# Patient Record
Sex: Female | Born: 1940 | Race: White | Hispanic: No | Marital: Married | Smoking: Never smoker
Health system: Southern US, Community
[De-identification: ages and names within clinical notes are randomized; demographics above are authoritative.]

## PROBLEM LIST (undated history)

## (undated) DIAGNOSIS — I10 Essential (primary) hypertension: Secondary | ICD-10-CM

## (undated) DIAGNOSIS — I471 Supraventricular tachycardia, unspecified: Secondary | ICD-10-CM

## (undated) DIAGNOSIS — I639 Cerebral infarction, unspecified: Secondary | ICD-10-CM

## (undated) DIAGNOSIS — M069 Rheumatoid arthritis, unspecified: Secondary | ICD-10-CM

## (undated) DIAGNOSIS — E079 Disorder of thyroid, unspecified: Secondary | ICD-10-CM

## (undated) DIAGNOSIS — K219 Gastro-esophageal reflux disease without esophagitis: Secondary | ICD-10-CM

## (undated) DIAGNOSIS — G2581 Restless legs syndrome: Secondary | ICD-10-CM

---

## 2021-07-02 ENCOUNTER — Emergency Department (HOSPITAL_BASED_OUTPATIENT_CLINIC_OR_DEPARTMENT_OTHER)
Admission: EM | Admit: 2021-07-02 | Discharge: 2021-07-02 | Disposition: A | Discharge status: Home / Self Care | Payer: Medicare Other | Attending: Emergency Medicine | Admitting: Emergency Medicine

## 2021-07-02 ENCOUNTER — Other Ambulatory Visit: Payer: Self-pay

## 2021-07-02 ENCOUNTER — Emergency Department (HOSPITAL_BASED_OUTPATIENT_CLINIC_OR_DEPARTMENT_OTHER): Payer: Medicare Other

## 2021-07-02 ENCOUNTER — Encounter (HOSPITAL_BASED_OUTPATIENT_CLINIC_OR_DEPARTMENT_OTHER): Payer: Self-pay | Admitting: Emergency Medicine

## 2021-07-02 ENCOUNTER — Other Ambulatory Visit (HOSPITAL_BASED_OUTPATIENT_CLINIC_OR_DEPARTMENT_OTHER): Payer: Self-pay

## 2021-07-02 DIAGNOSIS — I10 Essential (primary) hypertension: Secondary | ICD-10-CM | POA: Insufficient documentation

## 2021-07-02 DIAGNOSIS — M25531 Pain in right wrist: Secondary | ICD-10-CM | POA: Insufficient documentation

## 2021-07-02 DIAGNOSIS — W01198A Fall on same level from slipping, tripping and stumbling with subsequent striking against other object, initial encounter: Secondary | ICD-10-CM | POA: Diagnosis not present

## 2021-07-02 DIAGNOSIS — S59911A Unspecified injury of right forearm, initial encounter: Secondary | ICD-10-CM | POA: Diagnosis present

## 2021-07-02 DIAGNOSIS — S60211A Contusion of right wrist, initial encounter: Secondary | ICD-10-CM | POA: Insufficient documentation

## 2021-07-02 DIAGNOSIS — S52501A Unspecified fracture of the lower end of right radius, initial encounter for closed fracture: Secondary | ICD-10-CM | POA: Diagnosis not present

## 2021-07-02 DIAGNOSIS — S52614A Nondisplaced fracture of right ulna styloid process, initial encounter for closed fracture: Secondary | ICD-10-CM | POA: Diagnosis not present

## 2021-07-02 HISTORY — DX: Disorder of thyroid, unspecified: E07.9

## 2021-07-02 HISTORY — DX: Cerebral infarction, unspecified: I63.9

## 2021-07-02 HISTORY — DX: Essential (primary) hypertension: I10

## 2021-07-02 HISTORY — DX: Rheumatoid arthritis, unspecified: M06.9

## 2021-07-02 MED ORDER — OXYCODONE-ACETAMINOPHEN 5-325 MG PO TABS
0.5000 | ORAL_TABLET | Freq: Four times a day (QID) | ORAL | 0 refills | Status: DC | PRN
Start: 2021-07-02 — End: 2022-09-19
  Filled 2021-07-02: qty 5, 2d supply, fill #0

## 2021-07-02 MED ORDER — SENNOSIDES-DOCUSATE SODIUM 8.6-50 MG PO TABS
1.0000 | ORAL_TABLET | Freq: Every evening | ORAL | 0 refills | Status: DC | PRN
  Filled 2021-07-02: qty 100, 100d supply, fill #0

## 2021-07-02 NOTE — ED Notes (Signed)
XR at bedside

## 2021-07-02 NOTE — ED Provider Notes (Signed)
? ?Emergency Department Provider Note ? ? ?I have reviewed the triage vital signs and the nursing notes. ? ? ?HISTORY ? ?Chief Complaint ?Fall and Arm Pain ? ? ?HPI ?Sara Snow is a 81 y.o. female with PMH reviewed below presents to the emergency department for evaluation after mechanical fall at home.  Patient states she was backing up when she tripped on a bedspread on the floor and fell backwards.  She suspects she may have put her right arm out to catch herself but ultimately hit the ground.  She did hit the back of her head but states it was very minor.  No loss of consciousness.  No headache, confusion, vomiting.  She got pain with some swelling and bruising to the right wrist.  Pain worse with moving fingers.  No pain into the elbow or shoulder.  No pain in the hips, knees, ankles.  Patient has been ambulatory without difficulty since the fall. ? ?Past Medical History:  ?Diagnosis Date  ? Hypertension   ? RA (rheumatoid arthritis) (HCC)   ? Stroke [facility])   ? Thyroid disease   ? ? ?Review of Systems ? ?Constitutional: No fever/chills ?Cardiovascular: Denies chest pain. ?Respiratory: Denies shortness of breath. ?Gastrointestinal: No abdominal pain.  ?Musculoskeletal: Positive right wrist pain.  ?Skin: Negative for rash. ?Neurological: Negative for headaches, focal weakness or numbness. ? ? ?____________________________________________ ? ? ?PHYSICAL EXAM: ? ?VITAL SIGNS: ?ED Triage Vitals  ?Enc Vitals Group  ?   BP 07/02/21 0856 (!) 204/76  ?   Pulse Rate 07/02/21 0856 (!) 58  ?   Resp 07/02/21 0856 20  ?   Temp 07/02/21 0856 98 ?F (36.7 ?C)  ?   Temp Source 07/02/21 0856 Oral  ?   SpO2 07/02/21 0856 100 %  ?   Weight 07/02/21 0857 122 lb (55.3 kg)  ?   Height 07/02/21 0857 5\' 3"  (1.6 m)  ? ?Constitutional: Alert and oriented. Well appearing and in no acute distress. ?Eyes: Conjunctivae are normal. ?Head: Atraumatic. ?Nose: No congestion/rhinnorhea. ?Mouth/Throat: Mucous membranes are moist.  ?Neck: No  stridor.  No cervical spine tenderness to palpation. ?Cardiovascular: Good peripheral circulation.  ?Respiratory: Normal respiratory effort.   ?Gastrointestinal: No distention.  ?Musculoskeletal: No lower extremity tenderness nor edema. Normal ROM of the bilateral hips. Bruising and swelling to the distal right forearm/wrist w/o laceration. No elbow or shoulder tenderness.  ?Neurologic:  Normal speech and language. No gross focal neurologic deficits are appreciated.  ?Skin:  Skin is warm, dry and intact. Bruising as above.  ? ?____________________________________________ ? ?RADIOLOGY ? ?DG Wrist Complete Right ? ?Result Date: 07/02/2021 ?CLINICAL DATA:  81 year old female status post fall on outstretched hand. EXAM: RIGHT WRIST - COMPLETE 3+ VIEW COMPARISON:  None. FINDINGS: Acute, impacted fracture of the distal radius with mild apex palmar angulation. No evidence of intra-articular involvement. Associated minimally displaced acute avulsion fracture of the ulnar styloid. Carpal alignment is maintained. Soft tissue prominence about the wrist. IMPRESSION: 1. Acute, impacted and mildly angulated distal radius fracture. 2. Acute, mildly displaced avulsion fracture of the ulnar styloid. Electronically Signed   By: 77 M.D.   On: 07/02/2021 09:19   ? ?____________________________________________ ? ? ?PROCEDURES ? ?Procedure(s) performed:  ? ?Procedures ? ?None ?____________________________________________ ? ? ?INITIAL IMPRESSION / ASSESSMENT AND PLAN / ED COURSE ? ?Pertinent labs & imaging results that were available during my care of the patient were reviewed by me and considered in my medical decision making (see chart for details). ?  ?  This patient is Presenting for Evaluation of fall, which does require a range of treatment options, and is a complaint that involves a high risk of morbidity and mortality. ? ?The Differential Diagnoses include wrist fracture, dislocation, head injury, concussion, ICH ? ? ?I  did obtain Additional Historical Information from husband at bedside. ? ?I decided to review pertinent External Data, and in summary patient on ASA but no anticoagulation. ? ?Radiologic Tests Ordered, included x-ray of right wrist. I independently interpreted the images and agree with radiology interpretation.  ? ? ?Social Determinants of Health Risk patient is a non-smoker. ? ? ?Medical Decision Making: Summary:  ?Patient presents to the emergency department for evaluation of right wrist/forearm pain after mechanical fall.  Suspect distal radius fracture clinically.  No elbow or shoulder tenderness.  No outward sign of head trauma, cervical spine tenderness, other findings to prompt additional imaging.  Patient is not anticoagulated. ? ?Reevaluation with update and discussion with patient. Splint applied and sling provided for comfort. Discussed splint care, elevation of the extremity, and cool compress. Discussed calling the ortho office for follow up next week. Patient's pain is well controlled. Will use Tylenol at home but provided a small number of percocet with plan to take 1/2 tab PRN severe pain. Discussed precautions with this medication.  ? ?Disposition: Discharge ? ?____________________________________________ ? ?FINAL CLINICAL IMPRESSION(S) / ED DIAGNOSES ? ?Final diagnoses:  ?Closed fracture of distal end of right radius, unspecified fracture morphology, initial encounter  ?Closed nondisplaced fracture of styloid process of right ulna, initial encounter  ? ? ? ?NEW OUTPATIENT MEDICATIONS STARTED DURING THIS VISIT: ? ?New Prescriptions  ? OXYCODONE-ACETAMINOPHEN (PERCOCET/ROXICET) 5-325 MG TABLET    Take 1/2 - 1 tablet by mouth every 6 (six) hours as needed for severe pain.  ? SENNA-DOCUSATE (SENOKOT-S) 8.6-50 MG TABLET    Take 1 tablet by mouth at bedtime as needed for moderate constipation or mild constipation.  ? ? ?Note:  This document was prepared using Dragon voice recognition software and may  include unintentional dictation errors. ? ?Alona Bene, MD, FACEP ?Emergency Medicine ? ?  ?Maia Plan, MD ?07/02/21 [phone] ? ?

## 2021-07-02 NOTE — Discharge Instructions (Signed)
You were seen in the emergency department today after a fall.  You have a fracture to your right wrist.  We have placed you in a splint.  This will need to stay clean and dry.  The sling on the shoulder is for comfort and you may remove it.  Please keep your shoulder joint moving frequently out of the splint to prevent stiffness and soreness.  You will need to follow with a hand surgeon.  I have listed the on-call surgeon on this paperwork but you may see any orthopedist that you choose.  ? ?Please keep the right wrist elevated above the level of your heart when resting at home.  This will help reduce swelling.  You may also apply a cool compress.  ? ?I have called in a small number of pain medications if Tylenol is not enough for your discomfort.  Please note that Percocet may cause constipation but can also cause lightheadedness and increase your chance of additional falls.  Please use with caution.  You may start with 1/2 tablet as needed.  ?

## 2021-07-02 NOTE — ED Triage Notes (Signed)
[address] this am  she turned around quick fell caught herself with  rt wrist has a bump there ,  hurts to wiggle her fingers , has good pulse and < 3 sec cap refill, bumped  back of her head ,  no loc , not hard she states ?

## 2022-09-19 ENCOUNTER — Emergency Department (HOSPITAL_COMMUNITY): Payer: Medicare Other

## 2022-09-19 ENCOUNTER — Inpatient Hospital Stay (HOSPITAL_COMMUNITY)
Admission: EM | Admit: 2022-09-19 | Discharge: 2022-09-22 | DRG: 291 | Disposition: A | Discharge status: Nursing Home (Includes ICF) | Payer: Medicare Other | Source: Skilled Nursing Facility | Attending: Internal Medicine | Admitting: Internal Medicine

## 2022-09-19 ENCOUNTER — Encounter (HOSPITAL_COMMUNITY): Payer: Self-pay

## 2022-09-19 DIAGNOSIS — R0902 Hypoxemia: Secondary | ICD-10-CM | POA: Diagnosis present

## 2022-09-19 DIAGNOSIS — R7401 Elevation of levels of liver transaminase levels: Secondary | ICD-10-CM | POA: Diagnosis present

## 2022-09-19 DIAGNOSIS — I4819 Other persistent atrial fibrillation: Secondary | ICD-10-CM | POA: Diagnosis not present

## 2022-09-19 DIAGNOSIS — Z79890 Hormone replacement therapy (postmenopausal): Secondary | ICD-10-CM | POA: Diagnosis not present

## 2022-09-19 DIAGNOSIS — Z1152 Encounter for screening for COVID-19: Secondary | ICD-10-CM

## 2022-09-19 DIAGNOSIS — I4891 Unspecified atrial fibrillation: Secondary | ICD-10-CM | POA: Diagnosis not present

## 2022-09-19 DIAGNOSIS — K219 Gastro-esophageal reflux disease without esophagitis: Secondary | ICD-10-CM | POA: Diagnosis present

## 2022-09-19 DIAGNOSIS — Z79899 Other long term (current) drug therapy: Secondary | ICD-10-CM | POA: Diagnosis not present

## 2022-09-19 DIAGNOSIS — I5031 Acute diastolic (congestive) heart failure: Secondary | ICD-10-CM

## 2022-09-19 DIAGNOSIS — Z886 Allergy status to analgesic agent status: Secondary | ICD-10-CM

## 2022-09-19 DIAGNOSIS — D649 Anemia, unspecified: Secondary | ICD-10-CM | POA: Diagnosis not present

## 2022-09-19 DIAGNOSIS — E876 Hypokalemia: Secondary | ICD-10-CM | POA: Insufficient documentation

## 2022-09-19 DIAGNOSIS — I959 Hypotension, unspecified: Secondary | ICD-10-CM | POA: Diagnosis present

## 2022-09-19 DIAGNOSIS — Z8616 Personal history of COVID-19: Secondary | ICD-10-CM

## 2022-09-19 DIAGNOSIS — I700 Atherosclerosis of aorta: Secondary | ICD-10-CM | POA: Diagnosis present

## 2022-09-19 DIAGNOSIS — I110 Hypertensive heart disease with heart failure: Secondary | ICD-10-CM | POA: Diagnosis not present

## 2022-09-19 DIAGNOSIS — K2970 Gastritis, unspecified, without bleeding: Secondary | ICD-10-CM | POA: Diagnosis present

## 2022-09-19 DIAGNOSIS — K449 Diaphragmatic hernia without obstruction or gangrene: Secondary | ICD-10-CM | POA: Diagnosis present

## 2022-09-19 DIAGNOSIS — I10 Essential (primary) hypertension: Secondary | ICD-10-CM | POA: Diagnosis not present

## 2022-09-19 DIAGNOSIS — M069 Rheumatoid arthritis, unspecified: Secondary | ICD-10-CM | POA: Diagnosis present

## 2022-09-19 DIAGNOSIS — K319 Disease of stomach and duodenum, unspecified: Secondary | ICD-10-CM | POA: Diagnosis present

## 2022-09-19 DIAGNOSIS — I509 Heart failure, unspecified: Secondary | ICD-10-CM | POA: Diagnosis not present

## 2022-09-19 DIAGNOSIS — E039 Hypothyroidism, unspecified: Secondary | ICD-10-CM | POA: Insufficient documentation

## 2022-09-19 DIAGNOSIS — G2581 Restless legs syndrome: Secondary | ICD-10-CM | POA: Diagnosis present

## 2022-09-19 DIAGNOSIS — Z888 Allergy status to other drugs, medicaments and biological substances status: Secondary | ICD-10-CM | POA: Diagnosis not present

## 2022-09-19 DIAGNOSIS — K2950 Unspecified chronic gastritis without bleeding: Secondary | ICD-10-CM | POA: Diagnosis not present

## 2022-09-19 DIAGNOSIS — Z7982 Long term (current) use of aspirin: Secondary | ICD-10-CM

## 2022-09-19 DIAGNOSIS — K2270 Barrett's esophagus without dysplasia: Secondary | ICD-10-CM | POA: Diagnosis present

## 2022-09-19 DIAGNOSIS — M810 Age-related osteoporosis without current pathological fracture: Secondary | ICD-10-CM | POA: Diagnosis present

## 2022-09-19 DIAGNOSIS — Z8673 Personal history of transient ischemic attack (TIA), and cerebral infarction without residual deficits: Secondary | ICD-10-CM | POA: Diagnosis not present

## 2022-09-19 DIAGNOSIS — K921 Melena: Secondary | ICD-10-CM | POA: Diagnosis not present

## 2022-09-19 HISTORY — DX: Gastro-esophageal reflux disease without esophagitis: K21.9

## 2022-09-19 HISTORY — DX: Restless legs syndrome: G25.81

## 2022-09-19 HISTORY — DX: Supraventricular tachycardia, unspecified: I47.10

## 2022-09-19 LAB — CBC WITH DIFFERENTIAL/PLATELET
Abs Immature Granulocytes: 0.02 10*3/uL (ref 0.00–0.07)
Basophils Absolute: 0 10*3/uL (ref 0.0–0.1)
Basophils Relative: 1 %
Eosinophils Absolute: 0.1 10*3/uL (ref 0.0–0.5)
Eosinophils Relative: 2 %
HCT: 31.2 % — ABNORMAL LOW (ref 36.0–46.0)
Hemoglobin: 10.3 g/dL — ABNORMAL LOW (ref 12.0–15.0)
Immature Granulocytes: 0 %
Lymphocytes Relative: 16 %
Lymphs Abs: 0.9 10*3/uL (ref 0.7–4.0)
MCH: 30.7 pg (ref 26.0–34.0)
MCHC: 33 g/dL (ref 30.0–36.0)
MCV: 92.9 fL (ref 80.0–100.0)
Monocytes Absolute: 0.4 10*3/uL (ref 0.1–1.0)
Monocytes Relative: 8 %
Neutro Abs: 4.1 10*3/uL (ref 1.7–7.7)
Neutrophils Relative %: 73 %
Platelets: 224 10*3/uL (ref 150–400)
RBC: 3.36 MIL/uL — ABNORMAL LOW (ref 3.87–5.11)
RDW: 13.3 % (ref 11.5–15.5)
WBC: 5.5 10*3/uL (ref 4.0–10.5)
nRBC: 0 % (ref 0.0–0.2)

## 2022-09-19 LAB — HEPATIC FUNCTION PANEL
ALT: 34 U/L (ref 0–44)
AST: 45 U/L — ABNORMAL HIGH (ref 15–41)
Albumin: 3.4 g/dL — ABNORMAL LOW (ref 3.5–5.0)
Alkaline Phosphatase: 139 U/L — ABNORMAL HIGH (ref 38–126)
Bilirubin, Direct: 0.3 mg/dL — ABNORMAL HIGH (ref 0.0–0.2)
Indirect Bilirubin: 1.1 mg/dL — ABNORMAL HIGH (ref 0.3–0.9)
Total Bilirubin: 1.4 mg/dL — ABNORMAL HIGH (ref 0.3–1.2)
Total Protein: 6.6 g/dL (ref 6.5–8.1)

## 2022-09-19 LAB — BASIC METABOLIC PANEL
Anion gap: 11 (ref 5–15)
BUN: 18 mg/dL (ref 8–23)
CO2: 19 mmol/L — ABNORMAL LOW (ref 22–32)
Calcium: 8.7 mg/dL — ABNORMAL LOW (ref 8.9–10.3)
Chloride: 97 mmol/L — ABNORMAL LOW (ref 98–111)
Creatinine, Ser: 0.95 mg/dL (ref 0.44–1.00)
GFR, Estimated: >60 mL/min (ref >60)
Glucose, Bld: 93 mg/dL (ref 70–99)
Potassium: 4.3 mmol/L (ref 3.5–5.1)
Sodium: 127 mmol/L — ABNORMAL LOW (ref 135–145)

## 2022-09-19 LAB — TROPONIN I (HIGH SENSITIVITY)
Troponin I (High Sensitivity): 33 ng/L — ABNORMAL HIGH (ref <18)
Troponin I (High Sensitivity): 38 ng/L — ABNORMAL HIGH (ref <18)

## 2022-09-19 LAB — BRAIN NATRIURETIC PEPTIDE: B Natriuretic Peptide: 677.8 pg/mL — ABNORMAL HIGH (ref 0.0–100.0)

## 2022-09-19 LAB — POC OCCULT BLOOD, ED: Fecal Occult Bld: NEGATIVE

## 2022-09-19 LAB — AMMONIA: Ammonia: <10 umol/L (ref 9–35)

## 2022-09-19 LAB — SARS CORONAVIRUS 2 BY RT PCR: SARS Coronavirus 2 by RT PCR: NEGATIVE

## 2022-09-19 MED ORDER — OXYCODONE-ACETAMINOPHEN 5-325 MG PO TABS: 0.5000 | ORAL_TABLET | Freq: Four times a day (QID) | ORAL | Status: DC | PRN

## 2022-09-19 MED ORDER — METOPROLOL TARTRATE 5 MG/5ML IV SOLN: 5.0000 mg | Freq: Once | INTRAVENOUS | Status: DC

## 2022-09-19 MED ORDER — HEPARIN (PORCINE) 25000 UT/250ML-% IV SOLN
900.0000 [IU]/h | INTRAVENOUS | Status: DC
  Administered 2022-09-19: 900 [IU]/h via INTRAVENOUS
  Filled 2022-09-19: qty 250

## 2022-09-19 MED ORDER — ENOXAPARIN SODIUM 40 MG/0.4ML IJ SOSY
40.0000 mg | PREFILLED_SYRINGE | INTRAMUSCULAR | Status: DC
Start: 2022-09-19 — End: 2022-09-19

## 2022-09-19 MED ORDER — IOHEXOL 350 MG/ML SOLN
75.0000 mL | Freq: Once | INTRAVENOUS | Status: AC | PRN
  Administered 2022-09-19: 75 mL via INTRAVENOUS

## 2022-09-19 MED ORDER — GABAPENTIN 100 MG PO CAPS
100.0000 mg | ORAL_CAPSULE | Freq: Every day | ORAL | Status: DC
  Filled 2022-09-19: qty 1

## 2022-09-19 MED ORDER — METOPROLOL TARTRATE 5 MG/5ML IV SOLN
5.0000 mg | Freq: Once | INTRAVENOUS | Status: AC
  Administered 2022-09-19: 5 mg via INTRAVENOUS
  Filled 2022-09-19: qty 5

## 2022-09-19 MED ORDER — FUROSEMIDE 10 MG/ML IJ SOLN
40.0000 mg | Freq: Two times a day (BID) | INTRAMUSCULAR | Status: DC
  Administered 2022-09-20 (×2): 40 mg via INTRAVENOUS
  Filled 2022-09-19 (×2): qty 4

## 2022-09-19 MED ORDER — DILTIAZEM LOAD VIA INFUSION
10.0000 mg | Freq: Once | INTRAVENOUS | Status: AC
  Administered 2022-09-19: 10 mg via INTRAVENOUS
  Filled 2022-09-19: qty 10

## 2022-09-19 MED ORDER — FUROSEMIDE 10 MG/ML IJ SOLN
40.0000 mg | Freq: Once | INTRAMUSCULAR | Status: AC
  Administered 2022-09-19: 40 mg via INTRAVENOUS
  Filled 2022-09-19: qty 4

## 2022-09-19 MED ORDER — GABAPENTIN 100 MG PO CAPS
100.0000 mg | ORAL_CAPSULE | Freq: Every day | ORAL | Status: DC
  Administered 2022-09-19: 300 mg via ORAL
  Administered 2022-09-20 – 2022-09-21 (×2): 100 mg via ORAL
  Filled 2022-09-19: qty 3
  Filled 2022-09-19 (×2): qty 1

## 2022-09-19 MED ORDER — DILTIAZEM HCL-DEXTROSE 125-5 MG/125ML-% IV SOLN (PREMIX)
5.0000 mg/h | INTRAVENOUS | Status: DC
  Administered 2022-09-19: 5 mg/h via INTRAVENOUS
  Administered 2022-09-20: 10 mg/h via INTRAVENOUS
  Filled 2022-09-19 (×2): qty 125

## 2022-09-19 NOTE — ED Notes (Signed)
Patient transported to CT 

## 2022-09-19 NOTE — Progress Notes (Signed)
ANTICOAGULATION CONSULT NOTE - Initial Consult  Pharmacy Consult for Heparin Indication: atrial fibrillation  Allergies  Allergen Reactions   Amlodipine-Valsartan-Hctz    Celebrex [Celecoxib]    Hydroxychloroquine    Reglan [Metoclopramide]     Patient Measurements: Height: 5\' 3"  (160 cm) Weight: 55.3 kg (122 lb) IBW/kg (Calculated) : 52.4 Heparin Dosing Weight: 55.3 kg  Vital Signs: Temp: 97.8 F (36.6 C) (07/08 1700) Temp Source: Oral (07/08 1700) BP: 148/109 (07/08 1830) Pulse Rate: 111 (07/08 1840)  Labs: Recent Labs    09/19/22 1252 09/19/22 1455  HGB 10.3*  --   HCT 31.2*  --   PLT 224  --   CREATININE 0.95  --   TROPONINIHS 33* 38*    Estimated Creatinine Clearance: 39.1 mL/min (by C-G formula based on SCr of 0.95 mg/dL).   Medical History: Past Medical History:  Diagnosis Date   GERD (gastroesophageal reflux disease)    Hypertension    RA (rheumatoid arthritis) (HCC)    Restless leg syndrome    Stroke [facility])    SVT (supraventricular tachycardia)    Thyroid disease     Assessment: 26 yof with a history of HTN, Hypothyroidism, hx of stroke,. Patient is presenting with SOB. Heparin per pharmacy consult placed for atrial fibrillation.  Patient is not on anticoagulation prior to arrival.  Hgb 10.3; plt 224  Goal of Therapy:  Heparin level 0.3-0.7 units/ml Monitor platelets by anticoagulation protocol: Yes   Plan:  No initial heparin bolus Start heparin infusion at 900 units/hr Check anti-Xa level in 8 hours and daily while on heparin Continue to monitor H&H and platelets  Delmar Landau, PharmD, BCPS 09/19/2022 6:57 PM ED Clinical Pharmacist -  [phone]

## 2022-09-19 NOTE — H&P (Signed)
History and Physical    Patient: Sara Snow ZOX:[id number] DOB: 29-Oct-1940 DOA: 09/19/2022 DOS: the patient was seen and examined on 09/19/2022 PCP: Default, Provider, MD  Patient coming from: Home  Chief Complaint:  Chief Complaint  Patient presents with   Shortness of Breath   Leg Swelling   HPI: Sara Snow is a 82 y.o. female with medical history significant of hypertension, hypothyroidism, history of stroke presents to the emergency room today with complaints of shortness of breath.  Patient reports that she has been feeling short of breath for the last 4 to 5 days with experiencing some pedal edema.  However today she went to see her PCP at her clinic and refill her medications at which time she started developing more shortness of breath was evaluated and sent here for evaluation.  Upon evaluating here patient was found to be in A-fib with RVR, chest x-ray that was done showed pulmonary vascular congestion with 2+ pedal edema.  Patient was treated with the ER physician with Cardizem bolus and Cardizem drip, IV Lasix.  Patient reports to have urinated at least 3 times and states that her shortness of breath is improved after urination.  Patient denies having any chest pain palpitations or dizziness.  Patient did however mention that she has been having on and off black stools for the last 2 to 3 weeks and was planning to follow-up with the GI physician.  Patient is currently being admitted by the hospitalist team for evaluation of new onset atrial fibrillation, pulmonary vascular congestion.  Review of Systems: As mentioned in the history of present illness. All other systems reviewed and are negative. Past Medical History:  Diagnosis Date   GERD (gastroesophageal reflux disease)    Hypertension    RA (rheumatoid arthritis) (HCC)    Restless leg syndrome    Stroke [facility])    Thyroid disease    History reviewed. No pertinent surgical history. Social History:  reports that she  has never smoked. She has never been exposed to tobacco smoke. She has never used smokeless tobacco. She reports current alcohol use. She reports that she does not currently use drugs.  Allergies  Allergen Reactions   Amlodipine-Valsartan-Hctz    Celebrex [Celecoxib]    Hydroxychloroquine    Reglan [Metoclopramide]     History reviewed. No pertinent family history.  Prior to Admission medications   Medication Sig Start Date End Date Taking? Authorizing Provider  oxyCODONE-acetaminophen (PERCOCET/ROXICET) 5-325 MG tablet Take 1/2 - 1 tablet by mouth every 6 (six) hours as needed for severe pain. 07/02/21   Long, Arlyss Repress, MD  senna-docusate (SENOKOT-S) 8.6-50 MG tablet Take 1 tablet by mouth at bedtime as needed for moderate constipation or mild constipation. 07/02/21   Maia Plan, MD    Physical Exam: Vitals:   09/19/22 1545 09/19/22 1630 09/19/22 1700 09/19/22 1700  BP:   (!) 151/102   Pulse: (!) 122 (!) 113 (!) 104   Resp: (!) 21 20 16    Temp:    97.8 F (36.6 C)  TempSrc:    Oral  SpO2: 99% 94% 95%   Weight:      Height:       Constitutional:      General: She is not in acute distress.    Appearance: She is well-developed.  HENT:     Head: Normocephalic and atraumatic.  Eyes:     Conjunctiva/sclera: Conjunctivae normal.  Cardiovascular:     Rate and Rhythm: Tachycardia present. Rhythm irregular.  Pulses: Normal pulses.  Pulmonary:     Effort: Pulmonary effort is normal. No respiratory distress.     Breath sounds: Rales present.  Abdominal:     Palpations: Abdomen is soft.     Tenderness: There is no abdominal tenderness.  Musculoskeletal:     Cervical back: Neck supple.     Right lower leg: Edema present.     Left lower leg: Edema present.     Comments: 2+ bilateral LE edema  Skin:    General: Skin is warm and dry.     Capillary Refill: Capillary refill takes less than 2 seconds.  Neurological:     Mental Status: She is alert.  Psychiatric:         Mood and Affect: Mood norma   data Reviewed:  Labs reviewed hemoglobin 10.3 sodium 127, troponin elevated at 33 and 38, proBNP is elevated at 600.  Assessment and Plan: 1. new onset atrial fibrillation: - Patient presents with A-fib with RVR - Status post Cardizem bolus currently on a Cardizem drip continue the same. - Etiology appears to be most likely fluid overload/pulmonary vascular congestion, will check TSH and D-dimer as well. - Patient's CHADS2 Vasc score is 4 with risk for stroke is 8.5% and near - Patient clearly needs anticoagulation however he is complaining of having black stools the last 2 weeks.  Will consult GI and evaluate prior to initiating anticoagulation - Cardiology has been consulted.  echocardiogram ordered. - Patient blood pressure is currently stable.  #2 hypoxia/shortness of breath: - Chest x-ray shows pulmonary vascular congestion with 2+ pulmonary edema and elevated BNP leading to high suspicion for congestive heart failure - Patient admitted and currently on telemetry monitoring - Started on Lasix 40 mg IV twice daily - Echocardiogram ordered, cardiology is consulted - Monitor BMP - Strict intake output chart, daily weight. - Patient is currently not oxygen dependent.  3.  History of hypertension: - Patient is on metoprolol succinate at home we will continue the same along with IV diuresis - Patient blood pressure slightly elevated at this time.  4.  Hypothyroidism: Continue home dose of levothyroxine check TSH, free T4  5.DVT: SCD enoxaparin    Advance Care Planning:   Code Status: Full Code   Consults: Cardiology  Family Communication: Spoke to the patient's husband at bedside  Severity of Illness: The appropriate patient status for this patient is INPATIENT. Inpatient status is judged to be reasonable and necessary in order to provide the required intensity of service to ensure the patient's safety. The patient's presenting symptoms, physical  exam findings, and initial radiographic and laboratory data in the context of their chronic comorbidities is felt to place them at high risk for further clinical deterioration. Furthermore, it is not anticipated that the patient will be medically stable for discharge from the hospital within 2 midnights of admission.   * I certify that at the point of admission it is my clinical judgment that the patient will require inpatient hospital care spanning beyond 2 midnights from the point of admission due to high intensity of service, high risk for further deterioration and high frequency of surveillance required.*  Author: Harold Hedge, MD 09/19/2022 5:07 PM  For on call review [email or web address].

## 2022-09-19 NOTE — ED Provider Notes (Signed)
[place] EMERGENCY DEPARTMENT AT [facility] Provider Note   CSN: [id number] Arrival date & time: 09/19/22  1215     History  Chief Complaint  Patient presents with   Shortness of Breath   Leg Swelling    Sara Snow is a 82 y.o. female.   Shortness of Breath Associated symptoms: chest pain      82 year old female with medical history significant for CVA, GERD, rheumatoid arthritis, RLS who presents to the emergency department with shortness of breath, generalized leg swelling and chest pressure.  The patient states that symptoms been ongoing for the past 2 weeks.  She endorses bilateral lower extremity swelling, increasing dyspnea on exertion.  Endorses generalized chest pressure.  She denies any history of atrial fibrillation, denies any history of ACS or PE.  She is not on anticoagulation.  Home Medications Prior to Admission medications   Medication Sig Start Date End Date Taking? Authorizing Provider  oxyCODONE-acetaminophen (PERCOCET/ROXICET) 5-325 MG tablet Take 1/2 - 1 tablet by mouth every 6 (six) hours as needed for severe pain. 07/02/21   Long, Arlyss Repress, MD  senna-docusate (SENOKOT-S) 8.6-50 MG tablet Take 1 tablet by mouth at bedtime as needed for moderate constipation or mild constipation. 07/02/21   Long, Arlyss Repress, MD      Allergies    Amlodipine-valsartan-hctz, Celebrex [celecoxib], Hydroxychloroquine, and Reglan [metoclopramide]    Review of Systems   Review of Systems  Respiratory:  Positive for shortness of breath.   Cardiovascular:  Positive for chest pain and leg swelling.    Physical Exam Updated Vital Signs BP (!) 141/99   Pulse (!) 118   Temp 98 F (36.7 C)   Resp (!) 24   Ht 5\' 3"  (1.6 m)   Wt 55.3 kg   SpO2 95%   BMI 21.61 kg/m  Physical Exam Vitals and nursing note reviewed.  Constitutional:      General: She is not in acute distress.    Appearance: She is well-developed.  HENT:     Head: Normocephalic and atraumatic.   Eyes:     Conjunctiva/sclera: Conjunctivae normal.  Cardiovascular:     Rate and Rhythm: Tachycardia present. Rhythm irregular.     Pulses: Normal pulses.  Pulmonary:     Effort: Pulmonary effort is normal. No respiratory distress.     Breath sounds: Rales present.  Abdominal:     Palpations: Abdomen is soft.     Tenderness: There is no abdominal tenderness.  Musculoskeletal:     Cervical back: Neck supple.     Right lower leg: Edema present.     Left lower leg: Edema present.     Comments: 2+ bilateral LE edema  Skin:    General: Skin is warm and dry.     Capillary Refill: Capillary refill takes less than 2 seconds.  Neurological:     Mental Status: She is alert.  Psychiatric:        Mood and Affect: Mood normal.     ED Results / Procedures / Treatments   Labs (all labs ordered are listed, but only abnormal results are displayed) Labs Reviewed  BRAIN NATRIURETIC PEPTIDE - Abnormal; Notable for the following components:      Result Value   B Natriuretic Peptide 677.8 (*)    All other components within normal limits  BASIC METABOLIC PANEL - Abnormal; Notable for the following components:   Sodium 127 (*)    Chloride 97 (*)    CO2 19 (*)  Calcium 8.7 (*)    All other components within normal limits  CBC WITH DIFFERENTIAL/PLATELET - Abnormal; Notable for the following components:   RBC 3.36 (*)    Hemoglobin 10.3 (*)    HCT 31.2 (*)    All other components within normal limits  HEPATIC FUNCTION PANEL - Abnormal; Notable for the following components:   Albumin 3.4 (*)    AST 45 (*)    Alkaline Phosphatase 139 (*)    Total Bilirubin 1.4 (*)    Bilirubin, Direct 0.3 (*)    Indirect Bilirubin 1.1 (*)    All other components within normal limits  TROPONIN I (HIGH SENSITIVITY) - Abnormal; Notable for the following components:   Troponin I (High Sensitivity) 33 (*)    All other components within normal limits  TROPONIN I (HIGH SENSITIVITY) - Abnormal; Notable for the  following components:   Troponin I (High Sensitivity) 38 (*)    All other components within normal limits  SARS CORONAVIRUS 2 BY RT PCR  AMMONIA  TSH  T4, FREE  D-DIMER, QUANTITATIVE  HEPARIN LEVEL (UNFRACTIONATED)  POC OCCULT BLOOD, ED    EKG EKG Interpretation Date/Time:  Monday September 19 2022 12:56:39 EDT Ventricular Rate:  134 PR Interval:    QRS Duration:  129 QT Interval:  341 QTC Calculation: 502 R Axis:   0  Text Interpretation: Atrial fibrillation with rapid ventricular response Ventricular premature complex Nonspecific intraventricular conduction delay Probable lateral infarct, age indeterminate Anterior infarct, old Confirmed by Ernie Avena ([id number]) on 09/19/2022 3:57:42 PM  Radiology CT Angio Chest PE W and/or Wo Contrast  Result Date: 09/19/2022 CLINICAL DATA:  Pulmonary embolism (PE) suspected, high prob Shortness of breath. EXAM: CT ANGIOGRAPHY CHEST WITH CONTRAST TECHNIQUE: Multidetector CT imaging of the chest was performed using the standard protocol during bolus administration of intravenous contrast. Multiplanar CT image reconstructions and MIPs were obtained to evaluate the vascular anatomy. RADIATION DOSE REDUCTION: This exam was performed according to the departmental dose-optimization program which includes automated exposure control, adjustment of the mA and/or kV according to patient size and/or use of iterative reconstruction technique. CONTRAST:  75mL OMNIPAQUE IOHEXOL 350 MG/ML SOLN COMPARISON:  Radiograph earlier today FINDINGS: Cardiovascular: There are no filling defects within the pulmonary arteries to suggest pulmonary embolus. Atherosclerosis of the thoracic aorta with tortuosity. Multi chamber cardiomegaly. Trace pericardial effusion. There are coronary artery calcifications. Mediastinum/Nodes: Patulous esophagus without wall thickening. No mediastinal or hilar adenopathy. No thyroid nodule. Lungs/Pleura: Moderate bilateral pleural effusions with associated  compressive atelectasis. Occasional areas of septal thickening and ground-glass opacities suspicious for pulmonary edema. There is biapical pleuroparenchymal scarring. Trachea and central airways are clear. Upper Abdomen: No acute findings. Musculoskeletal: T11 superior endplate compression deformity appears subacute. Mild T7 superior endplate compression deformity which is age indeterminate. Review of the MIP images confirms the above findings. IMPRESSION: 1. No pulmonary embolus. 2. Congestive heart failure. Multi chamber cardiomegaly with moderate bilateral pleural effusions and pulmonary edema. 3. T11 superior endplate compression deformity appears subacute. Mild T7 superior endplate compression deformity which is age indeterminate. Aortic Atherosclerosis (ICD10-I70.0). Electronically Signed   By: Narda Rutherford M.D.   On: 09/19/2022 16:09   DG Chest Portable 1 View  Result Date: 09/19/2022 CLINICAL DATA:  Shortness of breath. EXAM: PORTABLE CHEST 1 VIEW COMPARISON:  None Available. FINDINGS: Mild cardiomegaly and pulmonary vascular congestion. Small bilateral pleural effusions with bibasilar opacities. No pneumothorax. No acute osseous abnormality. IMPRESSION: 1. Mild congestive heart failure. Electronically Signed   By:  Obie Dredge M.D.   On: 09/19/2022 13:45    Procedures Procedures    Medications Ordered in ED Medications  diltiazem (CARDIZEM) 1 mg/mL load via infusion 10 mg (10 mg Intravenous Bolus from Bag 09/19/22 1541)    And  diltiazem (CARDIZEM) 125 mg in dextrose 5% 125 mL (1 mg/mL) infusion (5 mg/hr Intravenous New Bag/Given 09/19/22 1540)  heparin ADULT infusion 100 units/mL (25000 units/242mL) (900 Units/hr Intravenous New Bag/Given 09/19/22 2029)  metoprolol tartrate (LOPRESSOR) injection 5 mg (5 mg Intravenous Given 09/19/22 1314)  furosemide (LASIX) injection 40 mg (40 mg Intravenous Given 09/19/22 1416)  iohexol (OMNIPAQUE) 350 MG/ML injection 75 mL (75 mLs Intravenous Contrast  Given 09/19/22 1555)    ED Course/ Medical Decision Making/ A&P                             Medical Decision Making Amount and/or Complexity of Data Reviewed Labs: ordered. Radiology: ordered.  Risk Prescription drug management. Decision regarding hospitalization.    82 year old female with medical history significant for CVA, GERD, rheumatoid arthritis, RLS who presents to the emergency department with shortness of breath, generalized leg swelling and chest pressure.  The patient states that symptoms been ongoing for the past 2 weeks.  She endorses bilateral lower extremity swelling, increasing dyspnea on exertion.  Endorses generalized chest pressure.  She denies any history of atrial fibrillation, denies any history of ACS or PE.  She is not on anticoagulation.  On arrival, the patient was found to be in atrial fibrillation with RVR, hypertensive, BP 180/122, saturating 94% on room air.  Physical exam concerning for new onset heart failure with bibasilar Rales, lower extremity edema, new onset atrial fibrillation with RVR.  EKG confirmed atrial fibrillation with RVR, ventricular rate 134.  IV access was obtained and the patient was initially administered IV metoprolol and IV Lasix bolus of 40 mg.  She was then administered diltiazem load and infusion.  She had report of melena and was not immediately started on anticoagulation.  Her fecal occult testing was collected and resulted negative.  COVID-19 PCR testing was collected and resulted negative.  BNP was elevated to 678.  A CBC revealed no leukocytosis, mild anemia to 10.3, BMP with hyponatremia to 127, not anion gap acidosis with a bicarb of 19, anion gap of 11, ammonia normal, troponins elevated but flat at 33 and 38.  CTA PE: IMPRESSION: 1. No pulmonary embolus. 2. Congestive heart failure. Multi chamber cardiomegaly with moderate bilateral pleural effusions and pulmonary edema. 3. T11 superior endplate compression deformity appears  subacute. Mild T7 superior endplate compression deformity which is age indeterminate.  Given the patient's new onset atrial fibrillation with RVR, new onset hypoxic respiratory failure with evidence of likely volume overload and possible CHF, medicine was consulted for admission with plan for inpatient cardiology consultation.  Dr. Quincy Sheehan to accepted the patient in admission.   Final Clinical Impression(s) / ED Diagnoses Final diagnoses:  Acute congestive heart failure, unspecified heart failure type (HCC)  Atrial fibrillation with RVR [facility])    Rx / DC Orders ED Discharge Orders     None         Ernie Avena, MD 09/19/22 2157

## 2022-09-19 NOTE — Consult Note (Signed)
CONSULTATION NOTE   Patient Name: Sara Snow Date of Encounter: 09/19/2022 Cardiologist: None Electrophysiologist: None Advanced Heart Failure: None   Chief Complaint   Short of breath  Patient Profile   82 yo female with progressive DOE, orthopnea, weight gain and fatigue, presented to PCP and found to be in afib with RVR  HPI   Sara Snow is a 82 y.o. female who is being seen today for the evaluation of new onset afib at the request of Dr. Quincy Sheehan. 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  She was there found to be in A-fib with RVR and sent to the hospital.  Her husband notes that she has been gaining about a pound every day and she has not been able to sleep at night due to the fact that she cannot lay flat.  She was previously followed by Dr. Maisie Fus in [place] but was last seen in 2022.  She had suffered a stroke with concomitant COVID-19 infection in 2022 and had an echo in 2023 which showed normal systolic function.  She has not had cardiac follow-up since then and they have subsequently moved to [facility].  On presentation she was noted to be in A-fib with RVR but her rates are now better controlled on IV diltiazem.  Blood pressure has been poorly controlled with systolic blood pressures up to 180 and diastolics in the 120s.  Pertinent labs show flat elevated troponin at 33 and 38 and elevated BNP of 677.  Sodium and chloride were low at 127 and 97 with an elevated AST of 45 and elevated total bilirubin of 1.4.  She recently has been having issues with dark stools and was noted to be anemic with hemoglobin of 10.3 and hematocrit 31.2.  Plan was for outpatient GI workup.  Stool guaiacs here have been negative.  She did undergo CT pulmonary angiography which was negative for PE  but showed congestive heart failure with multichamber cardiomegaly and moderate bilateral pleural effusions and pulmonary edema.  Aortic atherosclerosis was noted.  She was given IV diuretics in the ER and has had marked improvement in her breathing.  Of note she also has a history of SVT in the past but she said that she could do vagal maneuvers and improve those palpitation symptoms.  She did not really feel palpitations over the past week and if she did she was not able to control them.  PMHx   Past Medical History:  Diagnosis Date   GERD (gastroesophageal reflux disease)    Hypertension    RA (rheumatoid arthritis) (HCC)    Restless leg syndrome    Stroke [facility])    SVT (supraventricular tachycardia)    Thyroid disease     History reviewed. No pertinent surgical history.  FAMHx   History reviewed. No pertinent family history.  SOCHx    reports that she has never smoked. She has never been exposed to tobacco smoke. She has never used smokeless tobacco. She reports current alcohol use. She reports that she does not currently use drugs.  Outpatient Medications   No current facility-administered medications on file prior to encounter.   Current Outpatient Medications on File Prior to Encounter  Medication Sig Dispense Refill   oxyCODONE-acetaminophen (PERCOCET/ROXICET) 5-325 MG tablet Take 1/2 - 1 tablet by mouth every 6 (six) hours as  needed for severe pain. 5 tablet 0   senna-docusate (SENOKOT-S) 8.6-50 MG tablet Take 1 tablet by mouth at bedtime as needed for moderate constipation or mild constipation. 100 tablet 0    Inpatient Medications    Scheduled Meds:   Continuous Infusions:  diltiazem (CARDIZEM) infusion 5 mg/hr (09/19/22 1540)    PRN Meds:    ALLERGIES   Allergies  Allergen Reactions   Amlodipine-Valsartan-Hctz    Celebrex [Celecoxib]    Hydroxychloroquine    Reglan [Metoclopramide]     ROS   Pertinent items noted in HPI and remainder of  comprehensive ROS otherwise negative.  Vitals   Vitals:   09/19/22 1700 09/19/22 1700 09/19/22 1705 09/19/22 1800  BP: (!) 151/102   (!) 164/127  Pulse: (!) 104  (!) 106 (!) 101  Resp: 16  (!) 29 (!) 25  Temp:  97.8 F (36.6 C)    TempSrc:  Oral    SpO2: 95%  97% 98%  Weight:      Height:       No intake or output data in the 24 hours ending 09/19/22 1822 Filed Weights   09/19/22 1231  Weight: 55.3 kg    Physical Exam   General appearance: alert and no distress Neck: JVD - several cm above sternal notch, no carotid bruit, and thyroid not enlarged, symmetric, no tenderness/mass/nodules Lungs: diminished breath sounds bilaterally and rales bibasilar Heart: irregularly irregular rhythm Abdomen: soft, non-tender; bowel sounds normal; no masses,  no organomegaly Extremities: edema trace to 1+ bilateral lower extremity Pulses: 2+ and symmetric Skin: Pale, cool, dry Neurologic: Grossly normal Psych: Pleasant  Labs   Results for orders placed or performed during the hospital encounter of 09/19/22 (from the past 48 hour(s))  Brain natriuretic peptide     Status: Abnormal   Collection Time: 09/19/22 12:47 PM  Result Value Ref Range   B Natriuretic Peptide 677.8 (H) 0.0 - 100.0 pg/mL    Comment: Performed at [facility], [address] [place], [place] [address]  Basic metabolic panel     Status: Abnormal   Collection Time: 09/19/22 12:52 PM  Result Value Ref Range   Sodium 127 (L) 135 - 145 mmol/L   Potassium 4.3 3.5 - 5.1 mmol/L   Chloride 97 (L) 98 - 111 mmol/L   CO2 19 (L) 22 - 32 mmol/L   Glucose, Bld 93 70 - 99 mg/dL    Comment: Glucose reference range applies only to samples taken after fasting for at least 8 hours.   BUN 18 8 - 23 mg/dL   Creatinine, Ser 6.04 0.44 - 1.00 mg/dL   Calcium 8.7 (L) 8.9 - 10.3 mg/dL   GFR, Estimated >54 >09 mL/min    Comment: (NOTE) Calculated using the CKD-EPI Creatinine Equation (2021)    Anion gap 11 5 - 15    Comment:  Performed at [facility], [address] [place], [place] [address]  CBC with Differential     Status: Abnormal   Collection Time: 09/19/22 12:52 PM  Result Value Ref Range   WBC 5.5 4.0 - 10.5 K/uL   RBC 3.36 (L) 3.87 - 5.11 MIL/uL   Hemoglobin 10.3 (L) 12.0 - 15.0 g/dL   HCT 47.8 (L) 29.5 - 62.1 %   MCV 92.9 80.0 - 100.0 fL   MCH 30.7 26.0 - 34.0 pg   MCHC 33.0 30.0 - 36.0 g/dL   RDW 30.8 65.7 - 84.6 %   Platelets 224 150 -  400 K/uL   nRBC 0.0 0.0 - 0.2 %   Neutrophils Relative % 73 %   Neutro Abs 4.1 1.7 - 7.7 K/uL   Lymphocytes Relative 16 %   Lymphs Abs 0.9 0.7 - 4.0 K/uL   Monocytes Relative 8 %   Monocytes Absolute 0.4 0.1 - 1.0 K/uL   Eosinophils Relative 2 %   Eosinophils Absolute 0.1 0.0 - 0.5 K/uL   Basophils Relative 1 %   Basophils Absolute 0.0 0.0 - 0.1 K/uL   Immature Granulocytes 0 %   Abs Immature Granulocytes 0.02 0.00 - 0.07 K/uL    Comment: Performed at [facility], [address] [place], [place] [address]  Troponin I (High Sensitivity)     Status: Abnormal   Collection Time: 09/19/22 12:52 PM  Result Value Ref Range   Troponin I (High Sensitivity) 33 (H) <18 ng/L    Comment: (NOTE) Elevated high sensitivity troponin I (hsTnI) values and significant  changes across serial measurements may suggest ACS but many other  chronic and acute conditions are known to elevate hsTnI results.  Refer to the "Links" section for chest pain algorithms and additional  guidance. Performed at [facility], [address], [place], [place] [address]   Hepatic function panel     Status: Abnormal   Collection Time: 09/19/22 12:52 PM  Result Value Ref Range   Total Protein 6.6 6.5 - 8.1 g/dL   Albumin 3.4 (L) 3.5 - 5.0 g/dL   AST 45 (H) 15 - 41 U/L   ALT 34 0 - 44 U/L   Alkaline Phosphatase 139 (H) 38 - 126 U/L   Total Bilirubin 1.4 (H) 0.3 - 1.2 mg/dL   Bilirubin, Direct 0.3 (H) 0.0 - 0.2 mg/dL   Indirect Bilirubin 1.1 (H) 0.3 - 0.9 mg/dL     Comment: Performed at [facility], [address], [place], [place] [address]  Ammonia     Status: None   Collection Time: 09/19/22 12:57 PM  Result Value Ref Range   Ammonia <10 9 - 35 umol/L    Comment: Performed at [facility], [address], [place], [place] [address]  POC occult blood, ED     Status: None   Collection Time: 09/19/22  2:53 PM  Result Value Ref Range   Fecal Occult Bld NEGATIVE NEGATIVE  Troponin I (High Sensitivity)     Status: Abnormal   Collection Time: 09/19/22  2:55 PM  Result Value Ref Range   Troponin I (High Sensitivity) 38 (H) <18 ng/L    Comment: (NOTE) Elevated high sensitivity troponin I (hsTnI) values and significant  changes across serial measurements may suggest ACS but many other  chronic and acute conditions are known to elevate hsTnI results.  Refer to the "Links" section for chest pain algorithms and additional  guidance. Performed at [facility], [address], [place], [place] [address]   SARS Coronavirus 2 by RT PCR (hospital order, performed in [facility] hospital lab) *cepheid single result test* Anterior Nasal Swab     Status: None   Collection Time: 09/19/22  3:42 PM   Specimen: Anterior Nasal Swab  Result Value Ref Range   SARS Coronavirus 2 by RT PCR NEGATIVE NEGATIVE    Comment: Performed at [facility], [address] [place], [place] [address]    ECG   A-fib with IVCD at 134, possible old anterior infarct- Personally Reviewed  Telemetry  A-fib with rapid ventricular response- Personally Reviewed  Radiology   CT Angio Chest PE W and/or Wo Contrast  Result Date: 09/19/2022 CLINICAL DATA:  Pulmonary embolism (PE) suspected, high prob Shortness of breath. EXAM: CT ANGIOGRAPHY CHEST WITH CONTRAST TECHNIQUE: Multidetector CT imaging of the chest was performed using the standard protocol during bolus administration of intravenous contrast. Multiplanar CT image reconstructions and MIPs were obtained  to evaluate the vascular anatomy. RADIATION DOSE REDUCTION: This exam was performed according to the departmental dose-optimization program which includes automated exposure control, adjustment of the mA and/or kV according to patient size and/or use of iterative reconstruction technique. CONTRAST:  75mL OMNIPAQUE IOHEXOL 350 MG/ML SOLN COMPARISON:  Radiograph earlier today FINDINGS: Cardiovascular: There are no filling defects within the pulmonary arteries to suggest pulmonary embolus. Atherosclerosis of the thoracic aorta with tortuosity. Multi chamber cardiomegaly. Trace pericardial effusion. There are coronary artery calcifications. Mediastinum/Nodes: Patulous esophagus without wall thickening. No mediastinal or hilar adenopathy. No thyroid nodule. Lungs/Pleura: Moderate bilateral pleural effusions with associated compressive atelectasis. Occasional areas of septal thickening and ground-glass opacities suspicious for pulmonary edema. There is biapical pleuroparenchymal scarring. Trachea and central airways are clear. Upper Abdomen: No acute findings. Musculoskeletal: T11 superior endplate compression deformity appears subacute. Mild T7 superior endplate compression deformity which is age indeterminate. Review of the MIP images confirms the above findings. IMPRESSION: 1. No pulmonary embolus. 2. Congestive heart failure. Multi chamber cardiomegaly with moderate bilateral pleural effusions and pulmonary edema. 3. T11 superior endplate compression deformity appears subacute. Mild T7 superior endplate compression deformity which is age indeterminate. Aortic Atherosclerosis (ICD10-I70.0). Electronically Signed   By: Narda Rutherford M.D.   On: 09/19/2022 16:09   DG Chest Portable 1 View  Result Date: 09/19/2022 CLINICAL DATA:  Shortness of breath. EXAM: PORTABLE CHEST 1 VIEW COMPARISON:  None Available. FINDINGS: Mild cardiomegaly and pulmonary vascular congestion. Small bilateral pleural effusions with bibasilar  opacities. No pneumothorax. No acute osseous abnormality. IMPRESSION: 1. Mild congestive heart failure. Electronically Signed   By: Obie Dredge M.D.   On: 09/19/2022 13:45    Cardiac Studies   Echo ordered  Impression   Principal Problem:   Atrial fibrillation with rapid ventricular response (HCC) Active Problems:   Acute congestive heart failure (HCC)   Uncontrolled hypertension   Anemia   Recommendation   A-fib with rapid ventricular response Ms. Dundon has new onset A-fib with rapid ventricular response.  It did not sound like that she was symptomatic with it until she developed heart failure symptoms over the past week which makes me suspect it has been going on for several weeks.  This would not surprisingly be associated with development of acute systolic congestive heart failure.  Currently she is on diltiazem with good rate control and I suspect we can try to transition to a beta-blocker tomorrow.  Since she has been out of rhythm for some time she is at increased risk for stroke.  Her CHA2DS2-VASc score is high at 5 or 6 and I would recommend IV heparin.  Her stool occult's are negative although she is anemic and has reported dark stools in the recent past.  Heparin could be readily discontinued if she develops acute bleeding and this would allow [place] to identify possible source.  She would also need to be anticoagulated if we were to consider definitive TEE-guided cardioversion during this admission to improve her cardiac output. Acute congestive heart failure CT shows biventricular dilatation and I suspect this is new onset systolic heart failure.  Agree with current IV diuretics.  Echo pending tomorrow.  Will adjust medications for GDMT.  As mentioned, if she does have systolic heart failure, would benefit from TEE guided cardioversion to restore sinus rhythm. Uncontrolled hypertension Blood pressure is poorly controlled.  No home blood pressures are on her list.  Will likely  add an ARB tomorrow if renal function is stable. Anemia Reported dark stools, however Hemoccult here is negative.  Will require anticoagulation for her A-fib if we are to establish sinus rhythm.  Would monitor closely for signs of GI bleeding and ask GI to evaluate for possible GI sources of bleeding prior to oral anticoagulation at discharge.  Thanks for the consultation.  Cardiology will follow with you.  Time Spent Directly with Patient:  I have spent a total of 65 minutes with the patient reviewing hospital notes, telemetry, EKGs, labs and examining the patient as well as establishing an assessment and plan that was discussed personally with the patient.  > 50% of time was spent in direct patient care.  Length of Stay:  LOS: 0 days   Chrystie Nose, MD, [facility], FACP  [place]  [facility]  Medical Director of the Advanced Lipid Disorders &  Cardiovascular Risk Reduction Clinic Diplomate of the American Board of Clinical Lipidology Attending Cardiologist  Direct Dial: [phone]  Fax: [phone]  Website:  [email or web address]   Lisette Abu Navpreet Szczygiel 09/19/2022, 6:22 PM

## 2022-09-19 NOTE — ED Notes (Signed)
Cardiology at bedside.

## 2022-09-19 NOTE — ED Triage Notes (Signed)
Per EMS, Pt, from [employer], c/o increasing SOB and generalized swelling x2 weeks.  Denies pain.  Denies cardiac Hx.

## 2022-09-19 NOTE — ED Notes (Signed)
Pt requesting gabapentin for her leg pain, pt refused narcotic pain meds.

## 2022-09-20 ENCOUNTER — Other Ambulatory Visit: Payer: Self-pay

## 2022-09-20 ENCOUNTER — Other Ambulatory Visit (HOSPITAL_COMMUNITY): Payer: Self-pay

## 2022-09-20 ENCOUNTER — Inpatient Hospital Stay (HOSPITAL_COMMUNITY): Payer: Medicare Other

## 2022-09-20 DIAGNOSIS — I4819 Other persistent atrial fibrillation: Secondary | ICD-10-CM | POA: Diagnosis not present

## 2022-09-20 DIAGNOSIS — I4891 Unspecified atrial fibrillation: Secondary | ICD-10-CM | POA: Diagnosis not present

## 2022-09-20 DIAGNOSIS — K921 Melena: Secondary | ICD-10-CM

## 2022-09-20 DIAGNOSIS — I5031 Acute diastolic (congestive) heart failure: Secondary | ICD-10-CM

## 2022-09-20 DIAGNOSIS — D649 Anemia, unspecified: Secondary | ICD-10-CM | POA: Diagnosis not present

## 2022-09-20 LAB — ECHOCARDIOGRAM COMPLETE
AR max vel: 1.87 cm2
AV Area VTI: 2.04 cm2
AV Area mean vel: 1.77 cm2
AV Mean grad: 2.7 mmHg
AV Peak grad: 4.3 mmHg
Ao pk vel: 1.04 m/s
Area-P 1/2: 4.36 cm2
Calc EF: 57.4 %
Height: 63 in
MV M vel: 1.95 m/s
MV Peak grad: 15.2 mmHg
S' Lateral: 2.8 cm
Single Plane A2C EF: 60.2 %
Single Plane A4C EF: 58.1 %
Weight: 1943.58 oz

## 2022-09-20 LAB — CBC
HCT: 34.1 % — ABNORMAL LOW (ref 36.0–46.0)
Hemoglobin: 11.4 g/dL — ABNORMAL LOW (ref 12.0–15.0)
MCH: 30.8 pg (ref 26.0–34.0)
MCHC: 33.4 g/dL (ref 30.0–36.0)
MCV: 92.2 fL (ref 80.0–100.0)
Platelets: 243 10*3/uL (ref 150–400)
RBC: 3.7 MIL/uL — ABNORMAL LOW (ref 3.87–5.11)
RDW: 13.3 % (ref 11.5–15.5)
WBC: 9.9 10*3/uL (ref 4.0–10.5)
nRBC: 0 % (ref 0.0–0.2)

## 2022-09-20 LAB — CREATININE, SERUM
Creatinine, Ser: 0.85 mg/dL (ref 0.44–1.00)
GFR, Estimated: >60 mL/min (ref >60)

## 2022-09-20 LAB — D-DIMER, QUANTITATIVE: D-Dimer, Quant: 0.58 ug/mL-FEU — ABNORMAL HIGH (ref 0.00–0.50)

## 2022-09-20 LAB — HEPARIN LEVEL (UNFRACTIONATED)
Heparin Unfractionated: 0.44 IU/mL (ref 0.30–0.70)
Heparin Unfractionated: 0.56 IU/mL (ref 0.30–0.70)

## 2022-09-20 LAB — T4, FREE: Free T4: 1.76 ng/dL — ABNORMAL HIGH (ref 0.61–1.12)

## 2022-09-20 LAB — TSH: TSH: 5.772 u[IU]/mL — ABNORMAL HIGH (ref 0.350–4.500)

## 2022-09-20 MED ORDER — PANTOPRAZOLE SODIUM 40 MG PO TBEC
40.0000 mg | DELAYED_RELEASE_TABLET | Freq: Every day | ORAL | Status: DC
  Administered 2022-09-20 – 2022-09-22 (×3): 40 mg via ORAL
  Filled 2022-09-20 (×3): qty 1

## 2022-09-20 MED ORDER — LEVOTHYROXINE SODIUM 50 MCG PO TABS
50.0000 ug | ORAL_TABLET | Freq: Every day | ORAL | Status: DC
  Administered 2022-09-20 – 2022-09-22 (×3): 50 ug via ORAL
  Filled 2022-09-20 (×3): qty 1

## 2022-09-20 MED ORDER — METOPROLOL SUCCINATE ER 25 MG PO TB24
25.0000 mg | ORAL_TABLET | Freq: Every day | ORAL | Status: DC
  Administered 2022-09-20 – 2022-09-22 (×3): 25 mg via ORAL
  Filled 2022-09-20 (×3): qty 1

## 2022-09-20 MED ORDER — GABAPENTIN 100 MG PO CAPS: 100.0000 mg | ORAL_CAPSULE | Freq: Every day | ORAL | Status: DC

## 2022-09-20 MED ORDER — HEPARIN (PORCINE) 25000 UT/250ML-% IV SOLN
900.0000 [IU]/h | INTRAVENOUS | Status: AC
  Administered 2022-09-21: 900 [IU]/h via INTRAVENOUS
  Filled 2022-09-20: qty 250

## 2022-09-20 MED ORDER — LEVOTHYROXINE SODIUM 50 MCG PO TABS: 50.0000 ug | ORAL_TABLET | Freq: Every day | ORAL | Status: DC

## 2022-09-20 MED ORDER — ACETAMINOPHEN 325 MG PO TABS
650.0000 mg | ORAL_TABLET | Freq: Once | ORAL | Status: AC
  Administered 2022-09-20: 650 mg via ORAL
  Filled 2022-09-20: qty 2

## 2022-09-20 MED ORDER — POLYVINYL ALCOHOL 1.4 % OP SOLN
1.0000 [drp] | Freq: Every day | OPHTHALMIC | Status: DC
  Administered 2022-09-20 – 2022-09-22 (×12): 1 [drp] via OPHTHALMIC
  Filled 2022-09-20: qty 15

## 2022-09-20 MED ORDER — FOLIC ACID 1 MG PO TABS
1.0000 mg | ORAL_TABLET | Freq: Every day | ORAL | Status: DC
  Administered 2022-09-20 – 2022-09-22 (×3): 1 mg via ORAL
  Filled 2022-09-20 (×3): qty 1

## 2022-09-20 MED ORDER — ATORVASTATIN CALCIUM 40 MG PO TABS
40.0000 mg | ORAL_TABLET | Freq: Every day | ORAL | Status: DC
  Administered 2022-09-20 – 2022-09-21 (×2): 40 mg via ORAL
  Filled 2022-09-20 (×2): qty 1

## 2022-09-20 NOTE — Progress Notes (Signed)
ANTICOAGULATION CONSULT NOTE - Follow Up Consult  Pharmacy Consult for heparin Indication: atrial fibrillation  Labs: Recent Labs    09/19/22 1252 09/19/22 1455 09/20/22 0049 09/20/22 0427  HGB 10.3*  --  11.4*  --   HCT 31.2*  --  34.1*  --   PLT 224  --  243  --   HEPARINUNFRC  --   --   --  0.44  CREATININE 0.95  --  0.85  --   TROPONINIHS 33* 38*  --   --     Assessment/Plan:  82yo female therapeutic on heparin with initial dosing for Afib. Will continue infusion at current rate of 900 units/hr and confirm stable with additional level.  Vernard Gambles, PharmD, BCPS 09/20/2022 5:34 AM

## 2022-09-20 NOTE — Progress Notes (Signed)
Mobility Specialist Progress Note:   09/20/22 1609  Therapy Vitals  BP 113/75  Mobility  Activity Ambulated with assistance in hallway  Level of Assistance Contact guard assist, steadying assist  Assistive Device Front wheel walker  Distance Ambulated (ft) 100 ft  Activity Response Tolerated well  Mobility Referral Yes  $Mobility charge 1 Mobility  Mobility Specialist Start Time (ACUTE ONLY) 1557  Mobility Specialist Stop Time (ACUTE ONLY) 1617  Mobility Specialist Time Calculation (min) (ACUTE ONLY) 20 min    Pre Mobility: 104 HR,  113/75 BP During Mobility: 123 HR Post Mobility:  109 HR,  123/68 BP  Pt received in bed, agreeable to mobility. MinA for bed mobility and STS d/t weakness. Ambulated in hallway w/ RW and contact guard. Chair follow used. C/o some fatigue, otherwise asymptomatic. Pt left in chair with call bell and family present.  [place]ante Earlene Plater Mobility Specialist Please contact via Special educational needs teacher or Rehab office at [phone]

## 2022-09-20 NOTE — Progress Notes (Signed)
Progress Note   Patient: Sara Snow ZOX:[id number] DOB: 1941/03/01 DOA: 09/19/2022     1 DOS: the patient was seen and examined on 09/20/2022   Brief hospital course: Naketa Daddario is a 82 y.o. female with medical history significant of hypertension, hypothyroidism, history of stroke presents to the emergency room today with complaints of shortness of breath.  Patient reports that she has been feeling short of breath for the last 4 to 5 days with experiencing some pedal edema. Upon evaluating here patient was found to be in A-fib with RVR, chest x-ray that was done showed pulmonary vascular congestion with 2+ pedal edema.  Patient is currently on Cardizem and heparin drip evaluated by cardiology, echocardiogram pending.  Patient also has been having black stools hence GI has been consulted, they will plan for EGD later this week so as to facilitate long-term anticoagulation.  Assessment and Plan: 1. new onset atrial fibrillation: - Patient presents with A-fib with RVR - Status post Cardizem bolus currently on a Cardizem drip continue the same. - Etiology appears to be most likely fluid overload/pulmonary vascular congestion, TSH and free T4 both are elevated.  D-dimer is very insignificantly elevated to suspect pulmonary embolism - Patient's CHADS2 Vasc score is 4 with risk for stroke is 8.5% yearly - Patient clearly needs anticoagulation however he is complaining of having black stools the last 2 weeks.  GI consulted and advised that they will do EGD in order to facilitate long-term anticoagulation. - Cardiology has been consulted.  echocardiogram shows preserved ejection fraction without any valvular abnormality.  Patient has been initiated on heparin drip.  Per cardiology will need DOAC once GI has evaluated the patient with EGD. - Patient blood pressure is currently stable.   #2 hypoxia/shortness of breath: - Chest x-ray shows pulmonary vascular congestion with 2+ pulmonary edema and  elevated BNP leading to high suspicion for congestive heart failure - Patient admitted and currently on telemetry monitoring - Started on Lasix 40 mg IV twice daily - Echocardiogram shows preserved ejection fraction without any regional wall motion abnormality.  Cardiology consulted appreciate recommendations. - Monitor BMP - Strict intake output chart, daily weight. - Patient is currently not oxygen dependent.  3. Anemia/with black stools: - Suspect GI bleed.  Patient reports this has been going on for the last 2 weeks. - Currently hemoglobin is in the range of 10 . -Gastroenterologist consulted, patient is currently placed on Protonix.  GI planning for EGD once cardiac status is stabilized.  Currently no overt bleeding.    4.  History of hypertension: - Patient is on metoprolol succinate at home we will continue the same along with IV diuresis - Patient blood pressure slightly elevated at this time.   5.  Hypothyroidism: Continue home dose of levothyroxine, both TSH and free T4 remain elevated  6.DVT: SCD , heparin drip          Subjective: Patient was seen and examined bedside today.  Patient denies having any more episodes of shortness of breath.  She also denies having any chest pain palpitations dizziness.  Patient still in A-fib but rate controlled.  Blood pressure continues to be borderline.  Physical Exam: Vitals:   09/20/22 1100 09/20/22 1110 09/20/22 1111 09/20/22 1130  BP: (!) 99/52 (!) 100/52 (!) 96/53 (!) 96/54  Pulse:      Resp: 20   14  Temp:      TempSrc:      SpO2:      Weight:  Height:       Constitutional:      General: She is not in acute distress.    Appearance: She is well-developed.  HENT:     Head: Normocephalic and atraumatic.  Eyes:     Conjunctiva/sclera: Conjunctivae normal.  Cardiovascular:     Rate and Rhythm: Tachycardia present. Rhythm irregular.     Pulses: Normal pulses.  Pulmonary:     Effort: Pulmonary effort is normal. No  respiratory distress.     Breath sounds: Bilateral equal Abdominal:     Palpations: Abdomen is soft.     Tenderness: There is no abdominal tenderness.  Musculoskeletal:     Cervical back: Neck supple.     Right lower leg: Edema present.     Left lower leg: Edema present.     Comments: 2+ bilateral LE edema  Skin:    General: Skin is warm and dry.     Capillary Refill: Capillary refill takes less than 2 seconds.  Neurological:     Mental Status: She is alert.  Psychiatric:        Mood and Affect: Mood norma Data Reviewed:  CBC CMP reviewed  Family Communication: Spoke to patient's husband at bedside  Disposition: Status is: Inpatient Remains inpatient appropriate because: A-fib  Planned Discharge Destination: Home    Time spent: 35 minutes  Author: Harold Hedge, MD 09/20/2022 11:45 AM  For on call review [email or web address].

## 2022-09-20 NOTE — Progress Notes (Signed)
DAILY PROGRESS NOTE   Patient Name: Sara Snow Date of Encounter: 09/20/2022 Cardiologist: None  Chief Complaint   Feels better  Patient Profile   82 yo female with progressive DOE, orthopnea, weight gain and fatigue, presented to PCP and found to be in afib with RVR   Subjective   Remains in afib, but rate-controlled. She feels better. Echo today personally reviewed and shows LVEF 65-70%, mild LVH, normla RV function, mild LAE, left pleural effusion.  Appreciate GI recs- etiology of anemia is unclear, may recommend endoscopy. Blood pressure is low today, but was high yesterday.    Objective   Vitals:   09/20/22 1100 09/20/22 1110 09/20/22 1111 09/20/22 1130  BP: (!) 99/52 (!) 100/52 (!) 96/53 (!) 96/54  Pulse:      Resp: 20   14  Temp:      TempSrc:      SpO2:      Weight:      Height:        Intake/Output Summary (Last 24 hours) at 09/20/2022 1148 Last data filed at 09/20/2022 1009 Gross per 24 hour  Intake 120 ml  Output 600 ml  Net -480 ml   Filed Weights   09/19/22 1231 09/20/22 0124  Weight: 55.3 kg 55.1 kg    Physical Exam   General appearance: alert and no distress Neck: no carotid bruit, no JVD, and thyroid not enlarged, symmetric, no tenderness/mass/nodules Lungs: clear to auscultation bilaterally Heart: irregularly irregular rhythm Abdomen: soft, non-tender; bowel sounds normal; no masses,  no organomegaly Extremities: extremities normal, atraumatic, no cyanosis or edema Pulses: 2+ and symmetric Skin: Skin color, texture, turgor normal. No rashes or lesions Neurologic: Grossly normal Psych: Pleasant  Inpatient Medications    Scheduled Meds:  atorvastatin  40 mg Oral Q supper   folic acid  1 mg Oral Daily   furosemide  40 mg Intravenous Q12H   gabapentin  100-300 mg Oral QHS   levothyroxine  50 mcg Oral QAC breakfast   metoprolol succinate  25 mg Oral Daily   pantoprazole  40 mg Oral Daily   polyvinyl alcohol  1 drop Both Eyes 6 X  Daily    Continuous Infusions:  diltiazem (CARDIZEM) infusion 5 mg/hr (09/20/22 1107)   heparin 900 Units/hr (09/19/22 2029)    PRN Meds: oxyCODONE-acetaminophen   Labs   Results for orders placed or performed during the hospital encounter of 09/19/22 (from the past 48 hour(s))  Brain natriuretic peptide     Status: Abnormal   Collection Time: 09/19/22 12:47 PM  Result Value Ref Range   B Natriuretic Peptide 677.8 (H) 0.0 - 100.0 pg/mL    Comment: Performed at [facility], [address], [place], [place] [address]  Basic metabolic panel     Status: Abnormal   Collection Time: 09/19/22 12:52 PM  Result Value Ref Range   Sodium 127 (L) 135 - 145 mmol/L   Potassium 4.3 3.5 - 5.1 mmol/L   Chloride 97 (L) 98 - 111 mmol/L   CO2 19 (L) 22 - 32 mmol/L   Glucose, Bld 93 70 - 99 mg/dL    Comment: Glucose reference range applies only to samples taken after fasting for at least 8 hours.   BUN 18 8 - 23 mg/dL   Creatinine, Ser 6.04 0.44 - 1.00 mg/dL   Calcium 8.7 (L) 8.9 - 10.3 mg/dL   GFR, Estimated >54 >09 mL/min    Comment: (NOTE) Calculated using the CKD-EPI Creatinine  Equation (2021)    Anion gap 11 5 - 15    Comment: Performed at [facility], [address], [place], [place] [address]  CBC with Differential     Status: Abnormal   Collection Time: 09/19/22 12:52 PM  Result Value Ref Range   WBC 5.5 4.0 - 10.5 K/uL   RBC 3.36 (L) 3.87 - 5.11 MIL/uL   Hemoglobin 10.3 (L) 12.0 - 15.0 g/dL   HCT 47.8 (L) 29.5 - 62.1 %   MCV 92.9 80.0 - 100.0 fL   MCH 30.7 26.0 - 34.0 pg   MCHC 33.0 30.0 - 36.0 g/dL   RDW 30.8 65.7 - 84.6 %   Platelets 224 150 - 400 K/uL   nRBC 0.0 0.0 - 0.2 %   Neutrophils Relative % 73 %   Neutro Abs 4.1 1.7 - 7.7 K/uL   Lymphocytes Relative 16 %   Lymphs Abs 0.9 0.7 - 4.0 K/uL   Monocytes Relative 8 %   Monocytes Absolute 0.4 0.1 - 1.0 K/uL   Eosinophils Relative 2 %   Eosinophils Absolute 0.1 0.0 - 0.5 K/uL   Basophils Relative 1 %    Basophils Absolute 0.0 0.0 - 0.1 K/uL   Immature Granulocytes 0 %   Abs Immature Granulocytes 0.02 0.00 - 0.07 K/uL    Comment: Performed at [facility], [address], [place], [place] [address]  Troponin I (High Sensitivity)     Status: Abnormal   Collection Time: 09/19/22 12:52 PM  Result Value Ref Range   Troponin I (High Sensitivity) 33 (H) <18 ng/L    Comment: (NOTE) Elevated high sensitivity troponin I (hsTnI) values and significant  changes across serial measurements may suggest ACS but many other  chronic and acute conditions are known to elevate hsTnI results.  Refer to the "Links" section for chest pain algorithms and additional  guidance. Performed at [facility], [address], [place], [place] [address]   Hepatic function panel     Status: Abnormal   Collection Time: 09/19/22 12:52 PM  Result Value Ref Range   Total Protein 6.6 6.5 - 8.1 g/dL   Albumin 3.4 (L) 3.5 - 5.0 g/dL   AST 45 (H) 15 - 41 U/L   ALT 34 0 - 44 U/L   Alkaline Phosphatase 139 (H) 38 - 126 U/L   Total Bilirubin 1.4 (H) 0.3 - 1.2 mg/dL   Bilirubin, Direct 0.3 (H) 0.0 - 0.2 mg/dL   Indirect Bilirubin 1.1 (H) 0.3 - 0.9 mg/dL    Comment: Performed at [facility], [address], [place], [place] [address]  Ammonia     Status: None   Collection Time: 09/19/22 12:57 PM  Result Value Ref Range   Ammonia <10 9 - 35 umol/L    Comment: Performed at [facility], [address] [place], [place] [address]  POC occult blood, ED     Status: None   Collection Time: 09/19/22  2:53 PM  Result Value Ref Range   Fecal Occult Bld NEGATIVE NEGATIVE  Troponin I (High Sensitivity)     Status: Abnormal   Collection Time: 09/19/22  2:55 PM  Result Value Ref Range   Troponin I (High Sensitivity) 38 (H) <18 ng/L    Comment: (NOTE) Elevated high sensitivity troponin I (hsTnI) values and significant  changes across serial measurements may suggest ACS but many other  chronic and acute  conditions are known to elevate hsTnI  results.  Refer to the "Links" section for chest pain algorithms and additional  guidance. Performed at [facility], [address] [place], [place] [address]   SARS Coronavirus 2 by RT PCR (hospital order, performed in [facility] hospital lab) *cepheid single result test* Anterior Nasal Swab     Status: None   Collection Time: 09/19/22  3:42 PM   Specimen: Anterior Nasal Swab  Result Value Ref Range   SARS Coronavirus 2 by RT PCR NEGATIVE NEGATIVE    Comment: Performed at [facility], [address], [place], [place] [address]  CBC     Status: Abnormal   Collection Time: 09/20/22 12:49 AM  Result Value Ref Range   WBC 9.9 4.0 - 10.5 K/uL   RBC 3.70 (L) 3.87 - 5.11 MIL/uL   Hemoglobin 11.4 (L) 12.0 - 15.0 g/dL   HCT 69.6 (L) 29.5 - 28.4 %   MCV 92.2 80.0 - 100.0 fL   MCH 30.8 26.0 - 34.0 pg   MCHC 33.4 30.0 - 36.0 g/dL   RDW 13.2 44.0 - 10.2 %   Platelets 243 150 - 400 K/uL   nRBC 0.0 0.0 - 0.2 %    Comment: Performed at [facility], [address], [place], [place] [address]  Creatinine, serum     Status: None   Collection Time: 09/20/22 12:49 AM  Result Value Ref Range   Creatinine, Ser 0.85 0.44 - 1.00 mg/dL   GFR, Estimated >64 >40 mL/min    Comment: (NOTE) Calculated using the CKD-EPI Creatinine Equation (2021) Performed at [facility], [address], [place], [place] [address]   TSH     Status: Abnormal   Collection Time: 09/20/22  4:27 AM  Result Value Ref Range   TSH 5.772 (H) 0.350 - 4.500 uIU/mL    Comment: Performed by a 3rd Generation assay with a functional sensitivity of <=0.01 uIU/mL. Performed at [facility], [address], [place], [place] [address]   T4, free     Status: Abnormal   Collection Time: 09/20/22  4:27 AM  Result Value Ref Range   Free T4 1.76 (H) 0.61 - 1.12 ng/dL    Comment: (NOTE) Biotin ingestion may interfere with free T4 tests. If the results  are inconsistent with the TSH level, previous test results, or the clinical presentation, then consider biotin interference. If needed, order repeat testing after stopping biotin. Performed at [facility], [address] [place], [place] [address]   D-dimer, quantitative     Status: Abnormal   Collection Time: 09/20/22  4:27 AM  Result Value Ref Range   D-Dimer, Quant 0.58 (H) 0.00 - 0.50 ug/mL-FEU    Comment: (NOTE) At the manufacturer cut-off value of 0.5 g/mL FEU, this assay has a negative predictive value of 95-100%.This assay is intended for use in conjunction with a clinical pretest probability (PTP) assessment model to exclude pulmonary embolism (PE) and deep venous thrombosis (DVT) in outpatients suspected of PE or DVT. Results should be correlated with clinical presentation. Performed at [facility], [address], [place], [place] [address]   Heparin level (unfractionated)     Status: None   Collection Time: 09/20/22  4:27 AM  Result Value Ref Range   Heparin Unfractionated 0.44 0.30 - 0.70 IU/mL    Comment: (NOTE) The clinical reportable range upper limit is being lowered to >1.10 to align with the FDA approved guidance for the  current laboratory assay.  If heparin results are below expected values, and patient dosage has  been confirmed, suggest follow up testing of antithrombin III levels. Performed at [facility], [address] [place], [place] [address]     ECG   AFib at 134 - Personally Reviewed  Telemetry   Afib with CVR - Personally Reviewed  Radiology    ECHOCARDIOGRAM COMPLETE  Result Date: 09/20/2022    ECHOCARDIOGRAM REPORT   Patient Name:   KRISTIANNE BARSOUM Date of Exam: 09/20/2022 Medical Rec #:  [id number]         Height:       63.0 in Accession #:    [id number]        Weight:       121.5 lb Date of Birth:  Jul 27, 1940        BSA:          1.564 m Patient Age:    81 years          BP:           135/86 mmHg Patient  Gender: F                 HR:           96 bpm. 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 CHF, Stroke, Arrythmias:Atrial Fibrillation,                 Signs/Symptoms:Edema and Shortness of Breath; Risk                 Factors:Hypertension and Non-Smoker.  Sonographer:    Jake Seats RDMS, RVT, RDCS Referring Phys: [id number] [facility] [place]  Sonographer Comments: Global longitudinal strain was attempted. IMPRESSIONS  1. Left ventricular ejection fraction, by estimation, is 65 to 70%. The left ventricle has normal function. The left ventricle has no regional wall motion abnormalities. There is mild concentric left ventricular hypertrophy. Left ventricular diastolic function could not be evaluated.  2. Right ventricular systolic function is normal. The right ventricular size is normal. Tricuspid regurgitation signal is inadequate for assessing PA pressure.  3. Left atrial size was mildly dilated.  4. Moderate pleural effusion in the left lateral region.  5. The mitral valve is normal in structure. Mild mitral valve regurgitation.  6. The aortic valve is tricuspid. Aortic valve regurgitation is not visualized. No aortic stenosis is present.  7. The inferior vena cava is normal in size with greater than 50% respiratory variability, suggesting right atrial pressure of 3 mmHg. Comparison(s): Echocardiogram done at [facility] on 04/19/21 showed an EF of 60-65%. FINDINGS  Left Ventricle: Apex was suboptimally seen, otherwise normal LV wall motion. Left ventricular ejection fraction, by estimation, is 65 to 70%. The left ventricle has normal function. The left ventricle has no regional wall motion abnormalities. Global longitudinal strain performed but not reported based on  interpreter judgement due to suboptimal tracking. The left ventricular internal cavity size was normal in size. There is mild concentric left ventricular hypertrophy. Left ventricular diastolic function could not be evaluated due to atrial fibrillation. Left ventricular diastolic  function could not be evaluated. Right Ventricle: The right ventricular size is normal. No increase in right ventricular wall thickness. Right ventricular systolic function is normal. Tricuspid regurgitation signal is inadequate for assessing PA pressure. Left Atrium: Left atrial size was mildly dilated. Right Atrium: Right atrial size was normal in size. Pericardium: Trivial pericardial effusion is present. Mitral Valve: The mitral valve is normal in structure. Mild mitral valve regurgitation, with posteriorly-directed jet. Tricuspid Valve: The tricuspid valve is normal in structure. Tricuspid valve regurgitation is not demonstrated. Aortic Valve: The aortic valve is tricuspid. Aortic valve regurgitation is not visualized. No aortic stenosis is present. Aortic valve mean gradient measures 2.7 mmHg. Aortic valve peak gradient measures 4.3 mmHg. Aortic valve area, by VTI measures 2.04 cm. Pulmonic Valve: The pulmonic valve was grossly normal. Pulmonic valve regurgitation is trivial. No evidence of pulmonic stenosis. Aorta: The aortic root and ascending aorta are structurally normal, with no evidence of dilitation. Venous: The inferior vena cava is normal in size with greater than 50% respiratory variability, suggesting right atrial pressure of 3 mmHg. IAS/Shunts: No atrial level shunt detected by color flow Doppler. Additional Comments: There is a moderate pleural effusion in the left lateral region.  LEFT VENTRICLE PLAX 2D LVIDd:         4.00 cm     Diastology LVIDs:         2.80 cm     LV e' medial:    5.62 cm/s LV PW:         1.30 cm     LV E/e' medial:  18.5 LV IVS:        1.40 cm     LV e' lateral:   6.96 cm/s LVOT diam:     1.70 cm      LV E/e' lateral: 14.9 LV SV:         39 LV SV Index:   25 LVOT Area:     2.27 cm  LV Volumes (MOD) LV vol d, MOD A2C: 33.7 ml LV vol d, MOD A4C: 28.9 ml LV vol s, MOD A2C: 13.4 ml LV vol s, MOD A4C: 12.1 ml LV SV MOD A2C:     20.3 ml LV SV MOD A4C:     28.9 ml LV SV MOD BP:      18.1 ml RIGHT VENTRICLE RV S prime:     10.23 cm/s TAPSE (M-mode): 1.7 cm LEFT ATRIUM             Index        RIGHT ATRIUM           Index LA diam:        2.90 cm 1.85 cm/m   RA Area:     16.00 cm LA Vol (A2C):   34.5 ml 22.05 ml/m  RA Volume:   42.90 ml  27.42 ml/m LA Vol (A4C):   62.8 ml 40.14 ml/m LA Biplane Vol: 48.8 ml 31.19 ml/m  AORTIC VALVE AV Area (Vmax):    1.87 cm AV Area (Vmean):   1.77 cm AV Area (VTI):     2.04 cm AV Vmax:           103.80 cm/s AV Vmean:          78.600 cm/s AV VTI:            0.193 m AV Peak Grad:      4.3 mmHg AV Mean Grad:      2.7 mmHg LVOT Vmax:  85.30 cm/s LVOT Vmean:        61.150 cm/s LVOT VTI:          0.174 m LVOT/AV VTI ratio: 0.90  AORTA Ao Root diam: 2.80 cm Ao Asc diam:  2.90 cm MITRAL VALVE MV Area (PHT): 4.36 cm     SHUNTS MV Decel Time: 174 msec     Systemic VTI:  0.17 m MR Peak grad: 15.2 mmHg     Systemic Diam: 1.70 cm MR Vmax:      195.00 cm/s MV E velocity: 104.00 cm/s MV A velocity: 23.30 cm/s MV E/A ratio:  4.46 Mihai Croitoru MD Electronically signed by Thurmon Fair MD Signature Date/Time: 09/20/2022/10:29:01 AM    Final    CT Angio Chest PE W and/or Wo Contrast  Result Date: 09/19/2022 CLINICAL DATA:  Pulmonary embolism (PE) suspected, high prob Shortness of breath. EXAM: CT ANGIOGRAPHY CHEST WITH CONTRAST TECHNIQUE: Multidetector CT imaging of the chest was performed using the standard protocol during bolus administration of intravenous contrast. Multiplanar CT image reconstructions and MIPs were obtained to evaluate the vascular anatomy. RADIATION DOSE REDUCTION: This exam was performed according to the departmental dose-optimization program which includes  automated exposure control, adjustment of the mA and/or kV according to patient size and/or use of iterative reconstruction technique. CONTRAST:  75mL OMNIPAQUE IOHEXOL 350 MG/ML SOLN COMPARISON:  Radiograph earlier today FINDINGS: Cardiovascular: There are no filling defects within the pulmonary arteries to suggest pulmonary embolus. Atherosclerosis of the thoracic aorta with tortuosity. Multi chamber cardiomegaly. Trace pericardial effusion. There are coronary artery calcifications. Mediastinum/Nodes: Patulous esophagus without wall thickening. No mediastinal or hilar adenopathy. No thyroid nodule. Lungs/Pleura: Moderate bilateral pleural effusions with associated compressive atelectasis. Occasional areas of septal thickening and ground-glass opacities suspicious for pulmonary edema. There is biapical pleuroparenchymal scarring. Trachea and central airways are clear. Upper Abdomen: No acute findings. Musculoskeletal: T11 superior endplate compression deformity appears subacute. Mild T7 superior endplate compression deformity which is age indeterminate. Review of the MIP images confirms the above findings. IMPRESSION: 1. No pulmonary embolus. 2. Congestive heart failure. Multi chamber cardiomegaly with moderate bilateral pleural effusions and pulmonary edema. 3. T11 superior endplate compression deformity appears subacute. Mild T7 superior endplate compression deformity which is age indeterminate. Aortic Atherosclerosis (ICD10-I70.0). Electronically Signed   By: Narda Rutherford M.D.   On: 09/19/2022 16:09   DG Chest Portable 1 View  Result Date: 09/19/2022 CLINICAL DATA:  Shortness of breath. EXAM: PORTABLE CHEST 1 VIEW COMPARISON:  None Available. FINDINGS: Mild cardiomegaly and pulmonary vascular congestion. Small bilateral pleural effusions with bibasilar opacities. No pneumothorax. No acute osseous abnormality. IMPRESSION: 1. Mild congestive heart failure. Electronically Signed   By: Obie Dredge M.D.    On: 09/19/2022 13:45    Cardiac Studies   See echo above  Assessment   Principal Problem:   Atrial fibrillation with rapid ventricular response (HCC) Active Problems:   Acute congestive heart failure (HCC)   Uncontrolled hypertension   Anemia   Plan   A-fib with rapid ventricular response HR better controlled, but hypotensive today- stop diltiazem. On home metoprolol. Discussed management options, but in light of normal LV function on echo, no urgent need for cardioversion. Would prefer to see if she tolerates anticoagulation before committing her to it (such as if she had cardioversion, due to the increased stroke risk of the procedure). Will continue IV heparin and transition to DOAC at discharge. Acute congestive heart failure CT shows biventricular dilatation , however, echo showed supranormal LV  function, therefore, suspect some diastolic CHF in the setting of afib. Euvolemic today and hypotensive- will hold further diuresis. Repeat BMET and magenesium tomorrow.  Uncontrolled hypertension Now hypotensive today- hold cardizem. On metoprolol.  Anemia ? GIB history - seen by GI today- plan for tentative EGD tomorrow. Will continue IV Heparin and PPI - transition to DOAC prior to d/c.  Time Spent Directly with Patient:  I have spent a total of 25 minutes with the patient reviewing hospital notes, telemetry, EKGs, labs and examining the patient as well as establishing an assessment and plan that was discussed personally with the patient.  > 50% of time was spent in direct patient care.  Length of Stay:  LOS: 1 day   Chrystie Nose, MD, [facility], FACP  [place]  [facility]  Medical Director of the Advanced Lipid Disorders &  Cardiovascular Risk Reduction Clinic Diplomate of the American Board of Clinical Lipidology Attending Cardiologist  Direct Dial: [phone]  Fax: [phone]  Website:  [email or web address] Nicely Julliette Frentz 09/20/2022, 11:48 AM

## 2022-09-20 NOTE — TOC Benefit Eligibility Note (Signed)
Pharmacy Patient Advocate Encounter  Insurance verification completed.    The patient is insured through  [employer] test claim for Eliquis 5 mg and the current 30 day co-pay is $107.23.  Ran test claim for Xarelto 20 mg and the current 30 day co-pay is $102.75.   This test claim was processed through [facility]- copay amounts may vary at other pharmacies due to pharmacy/plan contracts, or as the patient moves through the different stages of their insurance plan.    Roland Earl, CPHT Pharmacy Patient Advocate Specialist [facility] Patient Advocate Team Direct Number: [phone]  Fax: [phone]

## 2022-09-20 NOTE — ED Notes (Signed)
ED TO INPATIENT HANDOFF REPORT  ED Nurse Name and Phone #: Murlean Iba Dahlia Client S PM [phone]  S Name/Age/Gender Sara Snow 82 y.o. female Room/Bed: 009C/009C  Code Status   Code Status: Full Code  Home/SNF/Other Nursing Home Patient oriented to: self, place, time, and situation Is this baseline? Yes   Triage Complete: Triage complete  Chief Complaint Atrial fibrillation by electrocardiogram [facility]) [I48.91]  Triage Note Per EMS, Pt, from [facility], c/o increasing SOB and generalized swelling x2 weeks.  Denies pain.  Denies cardiac Hx.      Allergies Allergies  Allergen Reactions   Hydroxychloroquine Other (See Comments)    Toxicity caused "bulls eye vision"   Reglan [Metoclopramide] Other (See Comments)    nightmares   Amlodipine-Valsartan-Hctz Rash   Celebrex [Celecoxib] Rash    Total body rash    Level of Care/Admitting Diagnosis ED Disposition     ED Disposition  Admit   Condition  --   Comment  Hospital Area: [facility] [[id number]]  Level of Care: Telemetry Cardiac [103]  May admit patient to [facility] or [facility] if equivalent level of care is available:: Yes  Covid Evaluation: Asymptomatic - no recent exposure (last 10 days) testing not required  Diagnosis: Atrial fibrillation by electrocardiogram [facility]) [[id number]]  Admitting Physician: Harold Hedge [[id number]]  Attending Physician: Harold Hedge [[id number]]  Certification:: I certify this patient will need inpatient services for at least 2 midnights  Estimated Length of Stay: 3          B Medical/Surgery History Past Medical History:  Diagnosis Date   GERD (gastroesophageal reflux disease)    Hypertension    RA (rheumatoid arthritis) (HCC)    Restless leg syndrome    Stroke [facility])    SVT (supraventricular tachycardia)    Thyroid disease    History reviewed. No pertinent surgical history.   A IV Location/Drains/Wounds Patient  Lines/Drains/Airways Status     Active Line/Drains/Airways     Name Placement date Placement time Site Days   Peripheral IV 09/19/22 20 G Distal;Left;Posterior Forearm 09/19/22  1227  Forearm  1   Peripheral IV 09/19/22 20 G Left Antecubital 09/19/22  1311  Antecubital  1            Intake/Output Last 24 hours No intake or output data in the 24 hours ending 09/20/22 0010  Labs/Imaging Results for orders placed or performed during the hospital encounter of 09/19/22 (from the past 48 hour(s))  Brain natriuretic peptide     Status: Abnormal   Collection Time: 09/19/22 12:47 PM  Result Value Ref Range   B Natriuretic Peptide 677.8 (H) 0.0 - 100.0 pg/mL    Comment: Performed at [facility], [address], [place], [place] [address]  Basic metabolic panel     Status: Abnormal   Collection Time: 09/19/22 12:52 PM  Result Value Ref Range   Sodium 127 (L) 135 - 145 mmol/L   Potassium 4.3 3.5 - 5.1 mmol/L   Chloride 97 (L) 98 - 111 mmol/L   CO2 19 (L) 22 - 32 mmol/L   Glucose, Bld 93 70 - 99 mg/dL    Comment: Glucose reference range applies only to samples taken after fasting for at least 8 hours.   BUN 18 8 - 23 mg/dL   Creatinine, Ser 8.46 0.44 - 1.00 mg/dL   Calcium 8.7 (L) 8.9 - 10.3 mg/dL   GFR, Estimated >96 >29 mL/min    Comment: (  NOTE) Calculated using the CKD-EPI Creatinine Equation (2021)    Anion gap 11 5 - 15    Comment: Performed at [facility], [address], [place], [place] [address]  CBC with Differential     Status: Abnormal   Collection Time: 09/19/22 12:52 PM  Result Value Ref Range   WBC 5.5 4.0 - 10.5 K/uL   RBC 3.36 (L) 3.87 - 5.11 MIL/uL   Hemoglobin 10.3 (L) 12.0 - 15.0 g/dL   HCT 60.4 (L) 54.0 - 98.1 %   MCV 92.9 80.0 - 100.0 fL   MCH 30.7 26.0 - 34.0 pg   MCHC 33.0 30.0 - 36.0 g/dL   RDW 19.1 47.8 - 29.5 %   Platelets 224 150 - 400 K/uL   nRBC 0.0 0.0 - 0.2 %   Neutrophils Relative % 73 %   Neutro Abs 4.1 1.7 - 7.7 K/uL    Lymphocytes Relative 16 %   Lymphs Abs 0.9 0.7 - 4.0 K/uL   Monocytes Relative 8 %   Monocytes Absolute 0.4 0.1 - 1.0 K/uL   Eosinophils Relative 2 %   Eosinophils Absolute 0.1 0.0 - 0.5 K/uL   Basophils Relative 1 %   Basophils Absolute 0.0 0.0 - 0.1 K/uL   Immature Granulocytes 0 %   Abs Immature Granulocytes 0.02 0.00 - 0.07 K/uL    Comment: Performed at [facility], [address], [place], [place] [address]  Troponin I (High Sensitivity)     Status: Abnormal   Collection Time: 09/19/22 12:52 PM  Result Value Ref Range   Troponin I (High Sensitivity) 33 (H) <18 ng/L    Comment: (NOTE) Elevated high sensitivity troponin I (hsTnI) values and significant  changes across serial measurements may suggest ACS but many other  chronic and acute conditions are known to elevate hsTnI results.  Refer to the "Links" section for chest pain algorithms and additional  guidance. Performed at [facility], [address] [place], [place] [address]   Hepatic function panel     Status: Abnormal   Collection Time: 09/19/22 12:52 PM  Result Value Ref Range   Total Protein 6.6 6.5 - 8.1 g/dL   Albumin 3.4 (L) 3.5 - 5.0 g/dL   AST 45 (H) 15 - 41 U/L   ALT 34 0 - 44 U/L   Alkaline Phosphatase 139 (H) 38 - 126 U/L   Total Bilirubin 1.4 (H) 0.3 - 1.2 mg/dL   Bilirubin, Direct 0.3 (H) 0.0 - 0.2 mg/dL   Indirect Bilirubin 1.1 (H) 0.3 - 0.9 mg/dL    Comment: Performed at [facility], [address], [place], [place] [address]  Ammonia     Status: None   Collection Time: 09/19/22 12:57 PM  Result Value Ref Range   Ammonia <10 9 - 35 umol/L    Comment: Performed at [facility], [address], [place], [place] [address]  POC occult blood, ED     Status: None   Collection Time: 09/19/22  2:53 PM  Result Value Ref Range   Fecal Occult Bld NEGATIVE NEGATIVE  Troponin I (High Sensitivity)     Status: Abnormal   Collection Time: 09/19/22  2:55 PM  Result Value Ref Range    Troponin I (High Sensitivity) 38 (H) <18 ng/L    Comment: (NOTE) Elevated high sensitivity troponin I (hsTnI) values and significant  changes across serial measurements may suggest ACS but many other  chronic and acute  conditions are known to elevate hsTnI results.  Refer to the "Links" section for chest pain algorithms and additional  guidance. Performed at [facility], [address] [place], [place] [address]   SARS Coronavirus 2 by RT PCR (hospital order, performed in [facility] hospital lab) *cepheid single result test* Anterior Nasal Swab     Status: None   Collection Time: 09/19/22  3:42 PM   Specimen: Anterior Nasal Swab  Result Value Ref Range   SARS Coronavirus 2 by RT PCR NEGATIVE NEGATIVE    Comment: Performed at [facility], [address], [place], [place] [address]   CT Angio Chest PE W and/or Wo Contrast  Result Date: 09/19/2022 CLINICAL DATA:  Pulmonary embolism (PE) suspected, high prob Shortness of breath. EXAM: CT ANGIOGRAPHY CHEST WITH CONTRAST TECHNIQUE: Multidetector CT imaging of the chest was performed using the standard protocol during bolus administration of intravenous contrast. Multiplanar CT image reconstructions and MIPs were obtained to evaluate the vascular anatomy. RADIATION DOSE REDUCTION: This exam was performed according to the departmental dose-optimization program which includes automated exposure control, adjustment of the mA and/or kV according to patient size and/or use of iterative reconstruction technique. CONTRAST:  75mL OMNIPAQUE IOHEXOL 350 MG/ML SOLN COMPARISON:  Radiograph earlier today FINDINGS: Cardiovascular: There are no filling defects within the pulmonary arteries to suggest pulmonary embolus. Atherosclerosis of the thoracic aorta with tortuosity. Multi chamber cardiomegaly. Trace pericardial effusion. There are coronary artery calcifications. Mediastinum/Nodes: Patulous esophagus without wall thickening. No mediastinal or hilar  adenopathy. No thyroid nodule. Lungs/Pleura: Moderate bilateral pleural effusions with associated compressive atelectasis. Occasional areas of septal thickening and ground-glass opacities suspicious for pulmonary edema. There is biapical pleuroparenchymal scarring. Trachea and central airways are clear. Upper Abdomen: No acute findings. Musculoskeletal: T11 superior endplate compression deformity appears subacute. Mild T7 superior endplate compression deformity which is age indeterminate. Review of the MIP images confirms the above findings. IMPRESSION: 1. No pulmonary embolus. 2. Congestive heart failure. Multi chamber cardiomegaly with moderate bilateral pleural effusions and pulmonary edema. 3. T11 superior endplate compression deformity appears subacute. Mild T7 superior endplate compression deformity which is age indeterminate. Aortic Atherosclerosis (ICD10-I70.0). Electronically Signed   By: Narda Rutherford M.D.   On: 09/19/2022 16:09   DG Chest Portable 1 View  Result Date: 09/19/2022 CLINICAL DATA:  Shortness of breath. EXAM: PORTABLE CHEST 1 VIEW COMPARISON:  None Available. FINDINGS: Mild cardiomegaly and pulmonary vascular congestion. Small bilateral pleural effusions with bibasilar opacities. No pneumothorax. No acute osseous abnormality. IMPRESSION: 1. Mild congestive heart failure. Electronically Signed   By: Obie Dredge M.D.   On: 09/19/2022 13:45    Pending Labs Unresulted Labs (From admission, onward)     Start     Ordered   09/26/22 0500  Creatinine, serum  (enoxaparin (LOVENOX)    CrCl >/= 30 ml/min)  Weekly,   R     Comments: while on enoxaparin therapy    09/19/22 2208   09/21/22 0500  Heparin level (unfractionated)  Daily,   R      09/19/22 1859   09/20/22 0300  Heparin level (unfractionated)  Once-Timed,   TIMED        09/19/22 1859   09/19/22 2209  CBC  (enoxaparin (LOVENOX)    CrCl >/= 30 ml/min)  Once,   R       Comments: Baseline for enoxaparin therapy IF NOT ALREADY  DRAWN.  Notify MD if PLT < 100 K.    09/19/22  2208   09/19/22 2209  Creatinine, serum  (enoxaparin (LOVENOX)    CrCl >/= 30 ml/min)  Once,   R       Comments: Baseline for enoxaparin therapy IF NOT ALREADY DRAWN.    09/19/22 2208   09/19/22 1740  T4, free  Add-on,   AD        09/19/22 1740   09/19/22 1740  D-dimer, quantitative  Add-on,   AD        09/19/22 1740   09/19/22 1739  TSH  Add-on,   AD        09/19/22 1740            Vitals/Pain Today's Vitals   09/19/22 2230 09/19/22 2300 09/19/22 2335 09/20/22 0000  BP: (!) 159/81 (!) 160/94 (!) 164/94 (!) 153/101  Pulse: (!) 109 (!) 119 (!) 106 98  Resp: 20 20 (!) 28 (!) 22  Temp:      TempSrc:      SpO2: 92% 94% 95% 92%  Weight:      Height:      PainSc:        Isolation Precautions No active isolations  Medications Medications  diltiazem (CARDIZEM) 1 mg/mL load via infusion 10 mg (10 mg Intravenous Bolus from Bag 09/19/22 1541)    And  diltiazem (CARDIZEM) 125 mg in dextrose 5% 125 mL (1 mg/mL) infusion (5 mg/hr Intravenous New Bag/Given 09/19/22 1540)  oxyCODONE-acetaminophen (PERCOCET/ROXICET) 5-325 MG per tablet 0.5-1 tablet (has no administration in time range)  furosemide (LASIX) injection 40 mg (has no administration in time range)  heparin ADULT infusion 100 units/mL (25000 units/225mL) (900 Units/hr Intravenous New Bag/Given 09/19/22 2029)  gabapentin (NEURONTIN) capsule 100-300 mg (300 mg Oral Given 09/19/22 2324)  metoprolol tartrate (LOPRESSOR) injection 5 mg (5 mg Intravenous Given 09/19/22 1314)  furosemide (LASIX) injection 40 mg (40 mg Intravenous Given 09/19/22 1416)  iohexol (OMNIPAQUE) 350 MG/ML injection 75 mL (75 mLs Intravenous Contrast Given 09/19/22 1555)    Mobility walks with person assist     Focused Assessments Cardiac Assessment Handoff:  Cardiac Rhythm: Atrial fibrillation No results found for: "CKTOTAL", "CKMB", "CKMBINDEX", "TROPONINI" No results found for: "DDIMER" Does the Patient  currently have chest pain? No    R Recommendations: See Admitting Provider Note  Report given to:   Additional Notes:

## 2022-09-20 NOTE — Progress Notes (Signed)
Patient arrived from ED, AO x4. Denies any pain, breathing even and unlabored in RA. CHG bath completed, connected to tele and CCMD notified. Oriented pt to room and call bell system. Call bell within reach, plan of care continues.

## 2022-09-20 NOTE — H&P (View-Only) (Signed)
Consultation  Referring Provider: TRH/Pudota Primary Care Physician:  Default, Provider, MD Primary Gastroenterologist: None, unassigned  Reason for Consultation: Anemia, history of intermittent dark stools  HPI: Sara Schmeiser is a 82 y.o. female who was admitted yesterday after she had presented to her PCP with complaints of progressive shortness of breath and lower extremity edema over the past 2 weeks.  She had also complained of some generalized mild chest pressure.  She was found to be in atrial fibrillation with RVR.  She had also noted weight gain of about a pound per day over the past couple of weeks. Patient does have history of hypertension, chronic GERD for which she is on chronic PPI therapy, rheumatoid arthritis which has been in remission, prior history of thyroid disease and SVT as well as restless leg syndrome.  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 She has not had any prior GI issues, she says she did have prior colonoscopy done about 9 years ago in [place] where she was living at the time and says this was negative, no polyps etc.  She believes she may have had EGD done at that same time.  No prior history of anemia or iron deficiency.  She has been noticing dark stools off and on over the past couple of months but no overt blood.  She does take baby aspirin at home, no NSAIDs, uses arthritis strength Tylenol.  Her only new medication was Tymos which she has been started on for osteoporosis.  She says she had multiple side effects and eventually stopped this towards the end of June.  She also has been having some mild upper abdominal discomfort recently, no nausea or vomiting, no dysphagia.  Workup since admit with CTA-negative for PE but does show multichamber cardiomegaly and moderate bilateral pleural effusions and pulmonary edema. Labs on admit BNP 677 Potassium 4.5 BUN 23/creatinine 0.9 Troponin 33, 38 TSH 5.7 Hemoglobin was  10.3/hematocrit 31.2/MCV normal, platelets 243  Repeat CBC today with hemoglobin 11.4/hematocrit 34.1/MCV 92  Stool documented heme negative Reviewing previous labs from January 2024 hemoglobin was 12.7 at that time.  She has been started on IV heparin  Echo today-EF 65 to 70% mild concentric left ventricular hypertrophy-unable to assess left ventricular diastolic function secondary to A-fib no aortic stenosis, mild mitral regurgitation   Past Medical History:  Diagnosis Date   GERD (gastroesophageal reflux disease)    Hypertension    RA (rheumatoid arthritis) (HCC)    Restless leg syndrome    Stroke [facility])    SVT (supraventricular tachycardia)    Thyroid disease     History reviewed. No pertinent surgical history.  Prior to Admission medications   Medication Sig Start Date End Date Taking? Authorizing Provider  acetaminophen (TYLENOL) 650 MG CR tablet Take 1,300 mg by mouth in the morning, at noon, and at bedtime. 04/21/22  Yes [provider]  aspirin EC 81 MG tablet Take 81 mg by mouth daily. 01/19/22  Yes [provider]  atorvastatin (LIPITOR) 40 MG tablet Take 40 mg by mouth daily with supper. 01/31/22  Yes [provider]  Calcium Citrate-Vitamin D 315-5 MG-MCG TABS Take 1 tablet by mouth daily. 06/28/16  Yes [provider]  Carboxymethylcellulose Sod PF (REFRESH PLUS) 0.5 % SOLN Place 1 drop into both eyes 6 (six) times daily. 06/28/16  Yes [provider]  folic acid (FOLVITE) 1 MG tablet Take 1 mg by mouth daily. 07/19/21  Yes [provider]  gabapentin (NEURONTIN) 100 MG capsule Take 100-300 mg by mouth at bedtime. For restless leg 10/11/21  Yes [provider]  levothyroxine (SYNTHROID) 50 MCG tablet Take 50 mcg by mouth daily before breakfast. Due to increase to after completing 04/11/22  Yes [provider]  metoprolol succinate (TOPROL-XL) 25 MG 24 hr tablet Take 25 mg by mouth daily. 04/09/21  05/17/23 Yes [provider]  Multiple Vitamins-Minerals (PRESERVISION AREDS) CAPS Take 1 capsule by mouth in the morning and at bedtime. 12/29/15  Yes [provider]  pantoprazole (PROTONIX) 40 MG tablet Take 40 mg by mouth daily. 02/23/22  Yes [provider]  UNABLE TO FIND Take 750 mg by mouth daily. Bone Restore - Life Extension: take 3 tablets daily   Yes [provider]  Vitamin D, Ergocalciferol, (DRISDOL) 1.25 MG (50000 UNIT) CAPS capsule Take 50,000 Units by mouth every 7 (seven) days. Sunday 12/14/21  Yes [provider]  Abaloparatide (TYMLOS) 3120 MCG/1.56ML SOPN Inject 80 mcg into the skin daily. Patient not taking: Reported on 09/19/2022 07/20/22   [provider]    Current Facility-Administered Medications  Medication Dose Route Frequency Provider Last Rate Last Admin   atorvastatin (LIPITOR) tablet 40 mg  40 mg Oral Q supper Pudota, Elsie Ra, MD       diltiazem (CARDIZEM) 125 mg in dextrose 5% 125 mL (1 mg/mL) infusion  5-15 mg/hr Intravenous Continuous Harold Hedge, MD 10 mL/hr at 09/20/22 0616 10 mg/hr at 09/20/22 0616   folic acid (FOLVITE) tablet 1 mg  1 mg Oral Daily Harold Hedge, MD   1 mg at 09/20/22 0852   furosemide (LASIX) injection 40 mg  40 mg Intravenous Q12H Harold Hedge, MD   40 mg at 09/20/22 0852   gabapentin (NEURONTIN) capsule 100-300 mg  100-300 mg Oral QHS Pudota, Elsie Ra, MD   300 mg at 09/19/22 2324   heparin ADULT infusion 100 units/mL (25000 units/2108mL)  900 Units/hr Intravenous Continuous Harold Hedge, MD 9 mL/hr at 09/19/22 2029 900 Units/hr at 09/19/22 2029   levothyroxine (SYNTHROID) tablet 50 mcg  50 mcg Oral QAC breakfast Harold Hedge, MD   50 mcg at 09/20/22 0852   metoprolol succinate (TOPROL-XL) 24 hr tablet 25 mg  25 mg Oral Daily Harold Hedge, MD   25 mg at 09/20/22 1610   oxyCODONE-acetaminophen (PERCOCET/ROXICET) 5-325 MG per tablet 0.5-1 tablet  0.5-1  tablet Oral Q6H PRN Pudota, Elsie Ra, MD       pantoprazole (PROTONIX) EC tablet 40 mg  40 mg Oral Daily Harold Hedge, MD   40 mg at 09/20/22 9604   polyvinyl alcohol (LIQUIFILM TEARS) 1.4 % ophthalmic solution 1 drop  1 drop Both Eyes 6 X Daily Harold Hedge, MD   1 drop at 09/20/22 1012    Allergies as of 09/19/2022 - Review Complete 09/19/2022  Allergen Reaction Noted   Hydroxychloroquine Other (See Comments) 07/02/2021   Reglan [metoclopramide] Other (See Comments) 07/02/2021   Amlodipine-valsartan-hctz Rash 07/02/2021   Celebrex [celecoxib] Rash 07/02/2021    History reviewed. No pertinent family history.  Social History   Socioeconomic History   Marital status: Married    Spouse name: Not on file   Number of children: Not on file   Years of education: Not on file   Highest education level: Not on file  Occupational History   Not on file  Tobacco Use   Smoking status: Never  Passive exposure: Never   Smokeless tobacco: Never  Vaping Use   Vaping Use: Never used  Substance and Sexual Activity   Alcohol use: Yes    Comment: occ   Drug use: Not Currently   Sexual activity: Not on file  Other Topics Concern   Not on file  Social History Narrative   Not on file   Social Determinants of Health   Financial Resource Strain: Not on file  Food Insecurity: Patient Declined (09/20/2022)   Hunger Vital Sign    Worried About Running Out of Food in the Last Year: Patient declined    Ran Out of Food in the Last Year: Patient declined  Transportation Needs: No Transportation Needs (09/20/2022)   PRAPARE - Administrator, Civil Service (Medical): No    Lack of Transportation (Non-Medical): No  Physical Activity: Not on file  Stress: Not on file  Social Connections: Not on file  Intimate Partner Violence: Not At Risk (09/20/2022)   Humiliation, Afraid, Rape, and Kick questionnaire    Fear of Current or Ex-Partner: No    Emotionally Abused: No     Physically Abused: No    Sexually Abused: No    Review of Systems: Pertinent positive and negative review of systems were noted in the above HPI section.  All other review of systems was otherwise negative.   Physical Exam: Vital signs in last 24 hours: Temp:  [97.6 F (36.4 C)-98 F (36.7 C)] 97.6 F (36.4 C) (07/09 0811) Pulse Rate:  [54-134] 88 (07/09 0811) Resp:  [16-29] 20 (07/09 0811) BP: (134-186)/(72-127) 134/82 (07/09 0811) SpO2:  [90 %-100 %] 94 % (07/09 0811) Weight:  [55.1 kg-55.3 kg] 55.1 kg (07/09 0124) Last BM Date : 09/19/22 General:   Alert,  Well-developed, frail-appearing elderly white female, pleasant and cooperative in NAD.  Patient sitting on the bedside commode, husband at bedside Head:  Normocephalic and atraumatic. Eyes:  Sclera clear, no icterus.   Conjunctiva pink. Ears:  Normal auditory acuity. Nose:  No deformity, discharge,  or lesions. Mouth:  No deformity or lesions.   Neck:  Supple; no masses or thyromegaly.  Positive JVD Lungs: Decreased breath sounds, fine rales bilateral bases  heart:  irRegular rate and rhythm; no murmurs, clicks, rubs,  or gallops. Abdomen:  Soft, some mild tenderness across the upper abdomen no guarding or rebound BS active,nonpalp mass or hsm.   Rectal not done, documented heme-negative on admit Msk:  Symmetrical without gross deformities. . Pulses:  Normal pulses noted. Extremities: Trace edema lower extremities Neurologic:  Alert and  oriented x4;  grossly normal neurologically. Skin:  Intact without significant lesions or rashes.. Psych:  Alert and cooperative. Normal mood and affect.  Intake/Output from previous day: 07/08 0701 - 07/09 0700 In: 120 [P.O.:120] Out: -  Intake/Output this shift: Total I/O In: -  Out: 600 [Urine:600]  Lab Results: Recent Labs    09/19/22 1252 09/20/22 0049  WBC 5.5 9.9  HGB 10.3* 11.4*  HCT 31.2* 34.1*  PLT 224 243   BMET Recent Labs    09/19/22 1252 09/20/22 0049   NA 127*  --   K 4.3  --   CL 97*  --   CO2 19*  --   GLUCOSE 93  --   BUN 18  --   CREATININE 0.95 0.85  CALCIUM 8.7*  --    LFT Recent Labs    09/19/22 1252  PROT 6.6  ALBUMIN 3.4*  AST 45*  ALT 34  ALKPHOS 139*  BILITOT 1.4*  BILIDIR 0.3*  IBILI 1.1*   PT/INR No results for input(s): "LABPROT", "INR" in the last 72 hours. Hepatitis Panel No results for input(s): "HEPBSAG", "HCVAB", "HEPAIGM", "HEPBIGM" in the last 72 hours.    IMPRESSION:  #20 82 year old white female with presumed new onset of A-fib with RVR over the past couple of weeks, associated with acute congestive heart failure and poorly controlled hypertension  #2 history of CVA 2022 #3 mild normocytic anemia and Hemoccult negative stool documented on admission. Patient had noted intermittent dark stool at home recently, no overt bleeding.  No regular NSAID use, had been on baby aspirin daily .  She is also on chronic PPI for GERD, has had mild upper abdominal discomfort.  No recent abdominal imaging  Etiology of the mild normocytic anemia is not clear, she does have history of rheumatoid arthritis but that has been in remission.  She has had a drop in hemoglobin of about 1 g since January 2024 and with complaints of intermittent dark stool need to consider that she had had some intermittent very low-grade blood loss at home over the past couple of months. Rule out gastropathy, peptic ulcer disease, erosive esophagitis  #4-negative colonoscopy 9 years ago per patient report #5 restless leg syndrome 6.  History of hypothyroidism 7.  History of SVT  Plan; Protonix 40 mg p.o. daily Serial hemoglobins Continue IV heparin We can consider EGD later this week after she is better stabilized from a cardiopulmonary standpoint. GI will follow with you  Burma Ketcher PA-C 09/20/2022, 10:55 AM

## 2022-09-20 NOTE — Progress Notes (Addendum)
ANTICOAGULATION CONSULT NOTE Pharmacy Consult for Heparin Indication: atrial fibrillation  Allergies  Allergen Reactions   Hydroxychloroquine Other (See Comments)    Toxicity caused "bulls eye vision"   Reglan [Metoclopramide] Other (See Comments)    nightmares   Amlodipine-Valsartan-Hctz Rash   Celebrex [Celecoxib] Rash    Total body rash    Patient Measurements: Height: 5\' 3"  (160 cm) Weight: 55.1 kg (121 lb 7.6 oz) IBW/kg (Calculated) : 52.4 Heparin Dosing Weight: 55 kg  Vital Signs: Temp: 97.6 F (36.4 C) (07/09 0811) Temp Source: Oral (07/09 0811) BP: 96/50 (07/09 1200) Pulse Rate: 88 (07/09 0811)  Labs: Recent Labs    09/19/22 1252 09/19/22 1455 09/20/22 0049 09/20/22 0427  HGB 10.3*  --  11.4*  --   HCT 31.2*  --  34.1*  --   PLT 224  --  243  --   HEPARINUNFRC  --   --   --  0.44  CREATININE 0.95  --  0.85  --   TROPONINIHS 33* 38*  --   --      Estimated Creatinine Clearance: 43.7 mL/min (by C-G formula based on SCr of 0.85 mg/dL).   Medical History: Past Medical History:  Diagnosis Date   GERD (gastroesophageal reflux disease)    Hypertension    RA (rheumatoid arthritis) (HCC)    Restless leg syndrome    Stroke [facility])    SVT (supraventricular tachycardia)    Thyroid disease     Assessment: 81 yof presenting with SOB, new afib with RVR. Heparin per pharmacy consult placed for atrial fibrillation. Patient is not on anticoagulation prior to arrival.  Confirmatory heparin level therapeutic. CBC stable. No bleed issues reported. Heparin to be turned off 7/10 at 0900 for EGD per GI request.  Goal of Therapy:  Heparin level 0.3-0.7 units/ml Monitor platelets by anticoagulation protocol: Yes   Plan:  Continue heparin infusion at 900 units/hr Monitor daily heparin level/CBC, s/sx bleeding Heparin stop time for 7/10 at 0900 entered per GI   Leia Alf, PharmD, BCPS Please check AMION for all [facility] contact numbers Clinical  Pharmacist 09/20/2022 1:25 PM

## 2022-09-20 NOTE — Consult Note (Signed)
Consultation  Referring Provider: [facility]/Pudota Primary Care Physician:  Default, Provider, MD Primary Gastroenterologist: None, unassigned  Reason for Consultation: Anemia, history of intermittent dark stools  HPI: Sara Snow is a 82 y.o. female who was admitted yesterday after she had presented to her PCP with complaints of progressive shortness of breath and lower extremity edema over the past 2 weeks.  She had also complained of some generalized mild chest pressure.  She was found to be in atrial fibrillation with RVR.  She had also noted weight gain of about a pound per day over the past couple of weeks. Patient does have history of hypertension, chronic GERD for which she is on chronic PPI therapy, rheumatoid arthritis which has been in remission, prior history of thyroid disease and SVT as well as restless leg syndrome.  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 She has not had any prior GI issues, she says she did have prior colonoscopy done about 9 years ago in [place] where she was living at the time and says this was negative, no polyps etc.  She believes she may have had EGD done at that same time.  No prior history of anemia or iron deficiency.  She has been noticing dark stools off and on over the past couple of months but no overt blood.  She does take baby aspirin at home, no NSAIDs, uses arthritis strength Tylenol.  Her only new medication was Tymos which she has been started on for osteoporosis.  She says she had multiple side effects and eventually stopped this towards the end of June.  She also has been having some mild upper abdominal discomfort recently, no nausea or vomiting, no dysphagia.  Workup since admit with CTA-negative for PE but does show multichamber cardiomegaly and moderate bilateral pleural effusions and pulmonary edema. Labs on admit BNP 677 Potassium 4.5 BUN 23/creatinine 0.9 Troponin 33, 38 TSH 5.7 Hemoglobin was  10.3/hematocrit 31.2/MCV normal, platelets 243  Repeat CBC today with hemoglobin 11.4/hematocrit 34.1/MCV 92  Stool documented heme negative Reviewing previous labs from January 2024 hemoglobin was 12.7 at that time.  She has been started on IV heparin  Echo today-EF 65 to 70% mild concentric left ventricular hypertrophy-unable to assess left ventricular diastolic function secondary to A-fib no aortic stenosis, mild mitral regurgitation   Past Medical History:  Diagnosis Date   GERD (gastroesophageal reflux disease)    Hypertension    RA (rheumatoid arthritis) (HCC)    Restless leg syndrome    Stroke [facility])    SVT (supraventricular tachycardia)    Thyroid disease     History reviewed. No pertinent surgical history.  Prior to Admission medications   Medication Sig Start Date End Date Taking? Authorizing Provider  acetaminophen (TYLENOL) 650 MG CR tablet Take 1,300 mg by mouth in the morning, at noon, and at bedtime. 04/21/22  Yes [provider]  aspirin EC 81 MG tablet Take 81 mg by mouth daily. 01/19/22  Yes [provider]  atorvastatin (LIPITOR) 40 MG tablet Take 40 mg by mouth daily with supper. 01/31/22  Yes [provider]  Calcium Citrate-Vitamin D 315-5 MG-MCG TABS Take 1 tablet by mouth daily. 06/28/16  Yes [provider]  Carboxymethylcellulose Sod PF (REFRESH PLUS) 0.5 % SOLN Place 1 drop into both eyes 6 (six) times daily. 06/28/16  Yes [provider]  folic acid (FOLVITE) 1 MG tablet Take 1 mg by mouth daily. 07/19/21  Yes [provider]  gabapentin (NEURONTIN) 100 MG capsule Take 100-300 mg by mouth at bedtime. For restless leg 10/11/21  Yes [provider]  levothyroxine (SYNTHROID) 50 MCG tablet Take 50 mcg by mouth daily before breakfast. Due to increase to after completing 04/11/22  Yes [provider]  metoprolol succinate (TOPROL-XL) 25 MG 24 hr tablet Take 25 mg by mouth daily. 04/09/21  05/17/23 Yes [provider]  Multiple Vitamins-Minerals (PRESERVISION AREDS) CAPS Take 1 capsule by mouth in the morning and at bedtime. 12/29/15  Yes [provider]  pantoprazole (PROTONIX) 40 MG tablet Take 40 mg by mouth daily. 02/23/22  Yes [provider]  UNABLE TO FIND Take 750 mg by mouth daily. Bone Restore - Life Extension: take 3 tablets daily   Yes [provider]  Vitamin D, Ergocalciferol, (DRISDOL) 1.25 MG (50000 UNIT) CAPS capsule Take 50,000 Units by mouth every 7 (seven) days. Sunday 12/14/21  Yes [provider]  Abaloparatide (TYMLOS) 3120 MCG/1.56ML SOPN Inject 80 mcg into the skin daily. Patient not taking: Reported on 09/19/2022 07/20/22   [provider]    Current Facility-Administered Medications  Medication Dose Route Frequency Provider Last Rate Last Admin   atorvastatin (LIPITOR) tablet 40 mg  40 mg Oral Q supper Pudota, Elsie Ra, MD       diltiazem (CARDIZEM) 125 mg in dextrose 5% 125 mL (1 mg/mL) infusion  5-15 mg/hr Intravenous Continuous Harold Hedge, MD 10 mL/hr at 09/20/22 0616 10 mg/hr at 09/20/22 0616   folic acid (FOLVITE) tablet 1 mg  1 mg Oral Daily Harold Hedge, MD   1 mg at 09/20/22 0852   furosemide (LASIX) injection 40 mg  40 mg Intravenous Q12H Harold Hedge, MD   40 mg at 09/20/22 0852   gabapentin (NEURONTIN) capsule 100-300 mg  100-300 mg Oral QHS Pudota, Elsie Ra, MD   300 mg at 09/19/22 2324   heparin ADULT infusion 100 units/mL (25000 units/27mL)  900 Units/hr Intravenous Continuous Harold Hedge, MD 9 mL/hr at 09/19/22 2029 900 Units/hr at 09/19/22 2029   levothyroxine (SYNTHROID) tablet 50 mcg  50 mcg Oral QAC breakfast Harold Hedge, MD   50 mcg at 09/20/22 0852   metoprolol succinate (TOPROL-XL) 24 hr tablet 25 mg  25 mg Oral Daily Harold Hedge, MD   25 mg at 09/20/22 7829   oxyCODONE-acetaminophen (PERCOCET/ROXICET) 5-325 MG per tablet 0.5-1 tablet  0.5-1  tablet Oral Q6H PRN Pudota, Elsie Ra, MD       pantoprazole (PROTONIX) EC tablet 40 mg  40 mg Oral Daily Harold Hedge, MD   40 mg at 09/20/22 5621   polyvinyl alcohol (LIQUIFILM TEARS) 1.4 % ophthalmic solution 1 drop  1 drop Both Eyes 6 X Daily Harold Hedge, MD   1 drop at 09/20/22 1012    Allergies as of 09/19/2022 - Review Complete 09/19/2022  Allergen Reaction Noted   Hydroxychloroquine Other (See Comments) 07/02/2021   Reglan [metoclopramide] Other (See Comments) 07/02/2021   Amlodipine-valsartan-hctz Rash 07/02/2021   Celebrex [celecoxib] Rash 07/02/2021    History reviewed. No pertinent family history.  Social History   Socioeconomic History   Marital status: Married    Spouse name: Not on file   Number of children: Not on file   Years of education: Not on file   Highest education level: Not on file  Occupational History   Not on file  Tobacco Use   Smoking status: Never  Passive exposure: Never   Smokeless tobacco: Never  Vaping Use   Vaping Use: Never used  Substance and Sexual Activity   Alcohol use: Yes    Comment: occ   Drug use: Not Currently   Sexual activity: Not on file  Other Topics Concern   Not on file  Social History Narrative   Not on file   Social Determinants of Health   Financial Resource Strain: Not on file  Food Insecurity: Patient Declined (09/20/2022)   Hunger Vital Sign    Worried About Running Out of Food in the Last Year: Patient declined    Ran Out of Food in the Last Year: Patient declined  Transportation Needs: No Transportation Needs (09/20/2022)   PRAPARE - Administrator, Civil Service (Medical): No    Lack of Transportation (Non-Medical): No  Physical Activity: Not on file  Stress: Not on file  Social Connections: Not on file  Intimate Partner Violence: Not At Risk (09/20/2022)   Humiliation, Afraid, Rape, and Kick questionnaire    Fear of Current or Ex-Partner: No    Emotionally Abused: No     Physically Abused: No    Sexually Abused: No    Review of Systems: Pertinent positive and negative review of systems were noted in the above HPI section.  All other review of systems was otherwise negative.   Physical Exam: Vital signs in last 24 hours: Temp:  [97.6 F (36.4 C)-98 F (36.7 C)] 97.6 F (36.4 C) (07/09 0811) Pulse Rate:  [54-134] 88 (07/09 0811) Resp:  [16-29] 20 (07/09 0811) BP: (134-186)/(72-127) 134/82 (07/09 0811) SpO2:  [90 %-100 %] 94 % (07/09 0811) Weight:  [55.1 kg-55.3 kg] 55.1 kg (07/09 0124) Last BM Date : 09/19/22 General:   Alert,  Well-developed, frail-appearing elderly white female, pleasant and cooperative in NAD.  Patient sitting on the bedside commode, husband at bedside Head:  Normocephalic and atraumatic. Eyes:  Sclera clear, no icterus.   Conjunctiva pink. Ears:  Normal auditory acuity. Nose:  No deformity, discharge,  or lesions. Mouth:  No deformity or lesions.   Neck:  Supple; no masses or thyromegaly.  Positive JVD Lungs: Decreased breath sounds, fine rales bilateral bases  heart:  irRegular rate and rhythm; no murmurs, clicks, rubs,  or gallops. Abdomen:  Soft, some mild tenderness across the upper abdomen no guarding or rebound BS active,nonpalp mass or hsm.   Rectal not done, documented heme-negative on admit Msk:  Symmetrical without gross deformities. . Pulses:  Normal pulses noted. Extremities: Trace edema lower extremities Neurologic:  Alert and  oriented x4;  grossly normal neurologically. Skin:  Intact without significant lesions or rashes.. Psych:  Alert and cooperative. Normal mood and affect.  Intake/Output from previous day: 07/08 0701 - 07/09 0700 In: 120 [P.O.:120] Out: -  Intake/Output this shift: Total I/O In: -  Out: 600 [Urine:600]  Lab Results: Recent Labs    09/19/22 1252 09/20/22 0049  WBC 5.5 9.9  HGB 10.3* 11.4*  HCT 31.2* 34.1*  PLT 224 243   BMET Recent Labs    09/19/22 1252 09/20/22 0049   NA 127*  --   K 4.3  --   CL 97*  --   CO2 19*  --   GLUCOSE 93  --   BUN 18  --   CREATININE 0.95 0.85  CALCIUM 8.7*  --    LFT Recent Labs    09/19/22 1252  PROT 6.6  ALBUMIN 3.4*  AST 45*  ALT 34  ALKPHOS 139*  BILITOT 1.4*  BILIDIR 0.3*  IBILI 1.1*   PT/INR No results for input(s): "LABPROT", "INR" in the last 72 hours. Hepatitis Panel No results for input(s): "HEPBSAG", "HCVAB", "HEPAIGM", "HEPBIGM" in the last 72 hours.    IMPRESSION:  #46 82 year old white female with presumed new onset of A-fib with RVR over the past couple of weeks, associated with acute congestive heart failure and poorly controlled hypertension  #2 history of CVA 2022 #3 mild normocytic anemia and Hemoccult negative stool documented on admission. Patient had noted intermittent dark stool at home recently, no overt bleeding.  No regular NSAID use, had been on baby aspirin daily .  She is also on chronic PPI for GERD, has had mild upper abdominal discomfort.  No recent abdominal imaging  Etiology of the mild normocytic anemia is not clear, she does have history of rheumatoid arthritis but that has been in remission.  She has had a drop in hemoglobin of about 1 g since January 2024 and with complaints of intermittent dark stool need to consider that she had had some intermittent very low-grade blood loss at home over the past couple of months. Rule out gastropathy, peptic ulcer disease, erosive esophagitis  #4-negative colonoscopy 9 years ago per patient report #5 restless leg syndrome 6.  History of hypothyroidism 7.  History of SVT  Plan; Protonix 40 mg p.o. daily Serial hemoglobins Continue IV heparin We can consider EGD later this week after she is better stabilized from a cardiopulmonary standpoint. GI will follow with you  Haruko Mersch PA-C 09/20/2022, 10:55 AM

## 2022-09-20 NOTE — Plan of Care (Signed)

## 2022-09-20 NOTE — Progress Notes (Signed)
Heart Failure Navigator Progress Note  Assessed for Heart & Vascular TOC clinic readiness.  Patient does not meet criteria due to EF 65-70%, patient plans to follow up with her cardiologist thru [facility] .   Navigator available for reassessment of patient.   Rhae Hammock, BSN, Scientist, clinical (histocompatibility and immunogenetics) Only

## 2022-09-21 ENCOUNTER — Inpatient Hospital Stay (HOSPITAL_COMMUNITY): Payer: Medicare Other | Admitting: Anesthesiology

## 2022-09-21 ENCOUNTER — Encounter (HOSPITAL_COMMUNITY)
Admission: EM | Disposition: A | Discharge status: Nursing Home (Includes ICF) | Payer: Self-pay | Source: Skilled Nursing Facility | Attending: Internal Medicine

## 2022-09-21 ENCOUNTER — Inpatient Hospital Stay (HOSPITAL_COMMUNITY): Payer: Medicare Other

## 2022-09-21 DIAGNOSIS — K2270 Barrett's esophagus without dysplasia: Secondary | ICD-10-CM

## 2022-09-21 DIAGNOSIS — K449 Diaphragmatic hernia without obstruction or gangrene: Secondary | ICD-10-CM

## 2022-09-21 DIAGNOSIS — K2950 Unspecified chronic gastritis without bleeding: Secondary | ICD-10-CM

## 2022-09-21 DIAGNOSIS — I5031 Acute diastolic (congestive) heart failure: Secondary | ICD-10-CM

## 2022-09-21 DIAGNOSIS — E876 Hypokalemia: Secondary | ICD-10-CM | POA: Insufficient documentation

## 2022-09-21 DIAGNOSIS — I4891 Unspecified atrial fibrillation: Secondary | ICD-10-CM

## 2022-09-21 DIAGNOSIS — E039 Hypothyroidism, unspecified: Secondary | ICD-10-CM | POA: Insufficient documentation

## 2022-09-21 DIAGNOSIS — D649 Anemia, unspecified: Secondary | ICD-10-CM

## 2022-09-21 DIAGNOSIS — K921 Melena: Secondary | ICD-10-CM | POA: Insufficient documentation

## 2022-09-21 DIAGNOSIS — I110 Hypertensive heart disease with heart failure: Secondary | ICD-10-CM

## 2022-09-21 HISTORY — PX: ESOPHAGOGASTRODUODENOSCOPY (EGD) WITH PROPOFOL: SHX5813

## 2022-09-21 HISTORY — PX: BIOPSY: SHX5522

## 2022-09-21 LAB — CBC
HCT: 32.1 % — ABNORMAL LOW (ref 36.0–46.0)
Hemoglobin: 10.9 g/dL — ABNORMAL LOW (ref 12.0–15.0)
MCH: 30.4 pg (ref 26.0–34.0)
MCHC: 34 g/dL (ref 30.0–36.0)
MCV: 89.7 fL (ref 80.0–100.0)
Platelets: 249 10*3/uL (ref 150–400)
RBC: 3.58 MIL/uL — ABNORMAL LOW (ref 3.87–5.11)
RDW: 13.2 % (ref 11.5–15.5)
WBC: 6.7 10*3/uL (ref 4.0–10.5)
nRBC: 0 % (ref 0.0–0.2)

## 2022-09-21 LAB — BASIC METABOLIC PANEL
Anion gap: 15 (ref 5–15)
BUN: 28 mg/dL — ABNORMAL HIGH (ref 8–23)
CO2: 22 mmol/L (ref 22–32)
Calcium: 8.3 mg/dL — ABNORMAL LOW (ref 8.9–10.3)
Chloride: 91 mmol/L — ABNORMAL LOW (ref 98–111)
Creatinine, Ser: 1.24 mg/dL — ABNORMAL HIGH (ref 0.44–1.00)
GFR, Estimated: 44 mL/min — ABNORMAL LOW (ref >60)
Glucose, Bld: 128 mg/dL — ABNORMAL HIGH (ref 70–99)
Potassium: 3.4 mmol/L — ABNORMAL LOW (ref 3.5–5.1)
Sodium: 128 mmol/L — ABNORMAL LOW (ref 135–145)

## 2022-09-21 LAB — HEPARIN LEVEL (UNFRACTIONATED): Heparin Unfractionated: 0.53 IU/mL (ref 0.30–0.70)

## 2022-09-21 LAB — MAGNESIUM: Magnesium: 1.8 mg/dL (ref 1.7–2.4)

## 2022-09-21 SURGERY — ESOPHAGOGASTRODUODENOSCOPY (EGD) WITH PROPOFOL
Anesthesia: Monitor Anesthesia Care

## 2022-09-21 MED ORDER — PHENYLEPHRINE 80 MCG/ML (10ML) SYRINGE FOR IV PUSH (FOR BLOOD PRESSURE SUPPORT)
PREFILLED_SYRINGE | INTRAVENOUS | Status: DC | PRN
  Administered 2022-09-21 (×2): 80 ug via INTRAVENOUS

## 2022-09-21 MED ORDER — POTASSIUM CHLORIDE IN NACL 20-0.9 MEQ/L-% IV SOLN
INTRAVENOUS | Status: AC
  Filled 2022-09-21: qty 1000

## 2022-09-21 MED ORDER — LACTATED RINGERS IV SOLN: INTRAVENOUS | Status: DC

## 2022-09-21 MED ORDER — PROPOFOL 500 MG/50ML IV EMUL
INTRAVENOUS | Status: DC | PRN
  Administered 2022-09-21: 125 ug/kg/min via INTRAVENOUS

## 2022-09-21 MED ORDER — HEPARIN (PORCINE) 25000 UT/250ML-% IV SOLN: 900.0000 [IU]/h | INTRAVENOUS | Status: DC

## 2022-09-21 MED ORDER — LIDOCAINE 2% (20 MG/ML) 5 ML SYRINGE
INTRAMUSCULAR | Status: DC | PRN
  Administered 2022-09-21: 50 mg via INTRAVENOUS

## 2022-09-21 MED ORDER — PROPOFOL 10 MG/ML IV BOLUS
INTRAVENOUS | Status: DC | PRN
  Administered 2022-09-21: 30 mg via INTRAVENOUS

## 2022-09-21 SURGICAL SUPPLY — 15 items

## 2022-09-21 NOTE — Anesthesia Procedure Notes (Signed)
Procedure Name: MAC Date/Time: 09/21/2022 2:55 PM  Performed by: Cheree Ditto, CRNAPre-anesthesia Checklist: Emergency Drugs available, Suction available and Patient being monitored Oxygen Delivery Method: Nasal cannula Induction Type: IV induction

## 2022-09-21 NOTE — Op Note (Signed)
[facility] Patient Name: Sara Snow Procedure Date : 09/21/2022 MRN: [id number] Attending MD: Dub Amis. Tomasa Rand , MD, [id number] Date of Birth: [birth year] CSN: [id number] Age: 82 Admit Type: Inpatient Procedure:                Upper GI endoscopy Indications:              Epigastric abdominal pain, Melena Providers:                Lorin Picket E. Tomasa Rand, MD, Geralyn Corwin, RN,                            Salley Scarlet, Technician Referring MD:              Medicines:                Monitored Anesthesia Care Complications:            No immediate complications. Estimated Blood Loss:     Estimated blood loss was minimal. Procedure:                Pre-Anesthesia Assessment:                           - Prior to the procedure, a History and Physical                            was performed, and patient medications and                            allergies were reviewed. The patient's tolerance of                            previous anesthesia was also reviewed. The risks                            and benefits of the procedure and the sedation                            options and risks were discussed with the patient.                            All questions were answered, and informed consent                            was obtained. Prior Anticoagulants: The patient has                            taken heparin, last dose was day of procedure. ASA                            Grade Assessment: III - A patient with severe                            systemic disease. After reviewing the risks and  benefits, the patient was deemed in satisfactory                            condition to undergo the procedure.                           After obtaining informed consent, the endoscope was                            passed under direct vision. Throughout the                            procedure, the patient's blood pressure, pulse, and                             oxygen saturations were monitored continuously. The                            [id number]-[id number] ([id number]) Olympus endoscope was introduced                            through the mouth, and advanced to the second part                            of duodenum. The upper GI endoscopy was                            accomplished without difficulty. The patient                            tolerated the procedure well. Scope In: Scope Out: Findings:      There were esophageal mucosal changes suspicious for long-segment       Barrett's esophagus present in the lower third of the esophagus. The       maximum longitudinal extent of these mucosal changes was 5 cm in length       (35-40 cm). Mucosa was biopsied with a cold forceps for histology in 4       quadrants at intervals of 2 cm from 35 to 39 cm from the incisors. A       total of 3 specimen bottles were sent to pathology. Estimated blood loss       was minimal.      The exam of the esophagus was otherwise normal.      A 4 cm hiatal hernia was present.      Localized mild inflammation characterized by erythema was found in the       gastric body. Biopsies were taken with a cold forceps for Helicobacter       pylori testing. Estimated blood loss was minimal.      The exam of the stomach was otherwise normal.      The examined duodenum was normal. Impression:               - Esophageal mucosal changes suspicious for                            long-segment  Barrett's esophagus. Biopsied.                           - 4 cm hiatal hernia.                           - Gastritis. Biopsied. No high risk bleeding                            lesions seen.                           - Normal examined duodenum. Moderate Sedation:      N/A Recommendation:           - Patient has a contact number available for                            emergencies. The signs and symptoms of potential                            delayed complications were discussed with  the                            patient. Return to normal activities tomorrow.                            Written discharge instructions were provided to the                            patient.                           - Resume previous diet.                           - Ok to start oral anticoagulation tomorrow. If                            necessary from cardiac standpoint, can restart                            heparin drip in 4 hours; patient did have numerous                            esophageal biopsies taken which potentially could                            bleed with early anticoagulation, but risk is very                            low.                           - Await pathology results.                           -  Consider repeat upper endoscopy (date not yet                            determined) for surveillance based on pathology                            results.                           - Use a proton pump inhibitor PO daily indefinitely.                           - GI will sign off now. Please reconsult with                            further questions/concerns Procedure Code(s):        --- Professional ---                           [phone], Esophagogastroduodenoscopy, flexible,                            transoral; with biopsy, single or multiple Diagnosis Code(s):        --- Professional ---                           K22.89, Other specified disease of esophagus                           K44.9, Diaphragmatic hernia without obstruction or                            gangrene                           K29.70, Gastritis, unspecified, without bleeding                           R10.13, Epigastric pain                           K92.1, Melena (includes Hematochezia) CPT copyright 2022 [employer]. All rights reserved. The codes documented in this report are preliminary and upon coder review may  be revised to meet current compliance requirements. Franki Stemen E.  Tomasa Rand, MD 09/21/2022 3:19:01 PM This report has been signed electronically. Number of Addenda: 0

## 2022-09-21 NOTE — Progress Notes (Signed)
PROGRESS NOTE    Sara Snow  EAV:[id number] DOB: 01-03-1941 DOA: 09/19/2022 PCP: Default, Provider, MD     Brief Narrative:  Sara Snow is a 82 y.o. female with medical history significant of hypertension, hypothyroidism, history of stroke presents to the emergency room today with complaints of shortness of breath.  Patient reports that she has been feeling short of breath for the last 4 to 5 days with experiencing some pedal edema. Upon evaluating here patient was found to be in A-fib with RVR, chest x-ray that was done showed pulmonary vascular congestion with 2+ pedal edema.  Patient is currently on Cardizem and heparin drip evaluated by cardiology, echocardiogram ordered.  Patient also has been having black stools hence GI has been consulted, EGD planned for 7/10.  New events last 24 hours / Subjective: Patient feeling well overall.  Awaiting EGD later this afternoon  Assessment & Plan:   Principal Problem:   Atrial fibrillation with rapid ventricular response (HCC) Active Problems:   Acute congestive heart failure (HCC)   Uncontrolled hypertension   Anemia   Acute diastolic heart failure (HCC)   Melena   Hypothyroidism   Hypokalemia   New onset A-fib RVR -Status post Cardizem drip --> Toprol -CHA2DS2-VASc score 4 -She remains on IV heparin, planning to transition to DOAC prior to discharge  Acute diastolic CHF -IV Lasix --> now on hold -Strict I's and O's, daily weight, fluid restriction diet -Giving small amount of IV fluids today in setting of n.p.o. status, bump in creatinine  Melena -Suspected GI bleed -Hemoglobin remains stable -GI consulted -EGD 7/10  Hypertension -Toprol  Hypothyroidism -Repeat TSH, free T4 as outpatient in 4 to 6 weeks -Synthroid  Hypokalemia -Replace  DVT prophylaxis:  SCDs Start: 09/19/22 2209  Code Status: Full code Family Communication: At bedside Disposition Plan: Home Status is: Inpatient Remains inpatient  appropriate because: IV heparin. EGD today     Antimicrobials:  Anti-infectives (From admission, onward)    None        Objective: Vitals:   09/21/22 1032 09/21/22 1145 09/21/22 1201 09/21/22 1231  BP: (!) 130/90 (!) 139/97    Pulse: (!) 105 68    Resp: 18 20  18   Temp:  97.9 F (36.6 C)    TempSrc:  Oral Oral   SpO2: 93% 100%    Weight:      Height:        Intake/Output Summary (Last 24 hours) at 09/21/2022 1320 Last data filed at 09/21/2022 7829 Gross per 24 hour  Intake 610.52 ml  Output 1151 ml  Net -540.48 ml   Filed Weights   09/20/22 0124 09/21/22 0445 09/21/22 5621  Weight: 55.1 kg 55.1 kg 54.8 kg    Examination:  General exam: Appears calm and comfortable  Respiratory system: Clear to auscultation. Respiratory effort normal. No respiratory distress. No conversational dyspnea.  Cardiovascular system: S1 & S2 heard, RRR. No murmurs. No pedal edema. Gastrointestinal system: Abdomen is nondistended, soft and nontender. Normal bowel sounds heard. Central nervous system: Alert and oriented. No focal neurological deficits. Speech clear.  Extremities: Symmetric in appearance  Skin: No rashes, lesions or ulcers on exposed skin  Psychiatry: Judgement and insight appear normal. Mood & affect appropriate.   Data Reviewed: I have personally reviewed following labs and imaging studies  CBC: Recent Labs  Lab 09/19/22 1252 09/20/22 0049 09/20/22 2359  WBC 5.5 9.9 6.7  NEUTROABS 4.1  --   --   HGB 10.3* 11.4* 10.9*  HCT  31.2* 34.1* 32.1*  MCV 92.9 92.2 89.7  PLT 224 243 249   Basic Metabolic Panel: Recent Labs  Lab 09/19/22 1252 09/20/22 0049 09/20/22 2359  NA 127*  --  128*  K 4.3  --  3.4*  CL 97*  --  91*  CO2 19*  --  22  GLUCOSE 93  --  128*  BUN 18  --  28*  CREATININE 0.95 0.85 1.24*  CALCIUM 8.7*  --  8.3*  MG  --   --  1.8   GFR: Estimated Creatinine Clearance: 29.9 mL/min (A) (by C-G formula based on SCr of 1.24 mg/dL (H)). Liver  Function Tests: Recent Labs  Lab 09/19/22 1252  AST 45*  ALT 34  ALKPHOS 139*  BILITOT 1.4*  PROT 6.6  ALBUMIN 3.4*   No results for input(s): "LIPASE", "AMYLASE" in the last 168 hours. Recent Labs  Lab 09/19/22 1257  AMMONIA <10   Coagulation Profile: No results for input(s): "INR", "PROTIME" in the last 168 hours. Cardiac Enzymes: No results for input(s): "CKTOTAL", "CKMB", "CKMBINDEX", "TROPONINI" in the last 168 hours. BNP (last 3 results) No results for input(s): "PROBNP" in the last 8760 hours. HbA1C: No results for input(s): "HGBA1C" in the last 72 hours. CBG: No results for input(s): "GLUCAP" in the last 168 hours. Lipid Profile: No results for input(s): "CHOL", "HDL", "LDLCALC", "TRIG", "CHOLHDL", "LDLDIRECT" in the last 72 hours. Thyroid Function Tests: Recent Labs    09/20/22 0427  TSH 5.772*  FREET4 1.76*   Anemia Panel: No results for input(s): "VITAMINB12", "FOLATE", "FERRITIN", "TIBC", "IRON", "RETICCTPCT" in the last 72 hours. Sepsis Labs: No results for input(s): "PROCALCITON", "LATICACIDVEN" in the last 168 hours.  Recent Results (from the past 240 hour(s))  SARS Coronavirus 2 by RT PCR (hospital order, performed in [facility] hospital lab) *cepheid single result test* Anterior Nasal Swab     Status: None   Collection Time: 09/19/22  3:42 PM   Specimen: Anterior Nasal Swab  Result Value Ref Range Status   SARS Coronavirus 2 by RT PCR NEGATIVE NEGATIVE Final    Comment: Performed at [facility], [address], [place], [place] [address]      Radiology Studies: DG Abd 1 View  Result Date: 09/21/2022 CLINICAL DATA:  Abdominal pain and possible constipation EXAM: ABDOMEN - 1 VIEW COMPARISON:  08/09/2018 FINDINGS: Scattered large and small bowel gas is noted. Mild fecal material is noted within the colon improved when compared with the prior exam. No free air or obstructive changes are seen. Stable scoliosis of the lumbar spine is noted.  IMPRESSION: Significantly reduced stool burden when compared with the prior exam. No obstructive changes are noted. Electronically Signed   By: Alcide Clever M.D.   On: 09/21/2022 11:42   ECHOCARDIOGRAM COMPLETE  Result Date: 09/20/2022    ECHOCARDIOGRAM REPORT   Patient Name:   TYRONE BALASH Date of Exam: 09/20/2022 Medical Rec #:  [id number]         Height:       63.0 in Accession #:    [id number]        Weight:       121.5 lb Date of Birth:  06/12/1940        BSA:          1.564 m Patient Age:    81 years          BP:           135/86 mmHg Patient  Gender: F                 HR:           96 bpm. 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 CHF, Stroke, Arrythmias:Atrial Fibrillation,                 Signs/Symptoms:Edema and Shortness of Breath; Risk                 Factors:Hypertension and Non-Smoker.  Sonographer:    Jake Seats RDMS, RVT, RDCS Referring Phys: [id number] [facility] [place]  Sonographer Comments: Global longitudinal strain was attempted. IMPRESSIONS  1. Left ventricular ejection fraction, by estimation, is 65 to 70%. The left ventricle has normal function. The left ventricle has no regional wall motion abnormalities. There is mild concentric left ventricular hypertrophy. Left ventricular diastolic function could not be evaluated.  2. Right ventricular systolic function is normal. The right ventricular size is normal. Tricuspid regurgitation signal is inadequate for assessing PA pressure.  3. Left atrial size was mildly dilated.  4. Moderate pleural effusion in the left lateral region.  5. The mitral valve is normal in structure. Mild mitral valve regurgitation.  6. The aortic valve is tricuspid. Aortic valve regurgitation is not visualized. No  aortic stenosis is present.  7. The inferior vena cava is normal in size with greater than 50% respiratory variability, suggesting right atrial pressure of 3 mmHg. Comparison(s): Echocardiogram done at [facility] on 04/19/21 showed an EF of 60-65%. FINDINGS  Left Ventricle: Apex was suboptimally seen, otherwise normal LV wall motion. Left ventricular ejection fraction, by estimation, is 65 to 70%. The left ventricle has normal function. The left ventricle has no regional wall motion abnormalities. Global longitudinal strain performed but not reported based on interpreter judgement due to suboptimal tracking. The left ventricular internal cavity size was normal in size. There is mild concentric left ventricular hypertrophy. Left ventricular diastolic function could not be evaluated due to atrial fibrillation. Left ventricular diastolic function could not be evaluated. Right Ventricle: The right ventricular size is normal. No increase in right ventricular wall thickness. Right ventricular systolic function is normal. Tricuspid regurgitation signal is inadequate for assessing PA pressure. Left Atrium: Left atrial size was mildly dilated. Right Atrium: Right atrial size was normal in size. Pericardium: Trivial pericardial effusion is present. Mitral Valve: The mitral valve is normal in structure. Mild mitral valve regurgitation, with posteriorly-directed jet. Tricuspid Valve: The tricuspid valve is normal in structure. Tricuspid valve regurgitation is not demonstrated. Aortic Valve: The aortic valve is tricuspid. Aortic valve regurgitation is not visualized. No aortic stenosis is present. Aortic valve mean gradient measures 2.7 mmHg. Aortic valve peak gradient measures 4.3 mmHg. Aortic valve area, by VTI measures 2.04 cm. Pulmonic Valve: The pulmonic valve was grossly normal. Pulmonic valve regurgitation is trivial. No evidence of pulmonic stenosis. Aorta: The aortic root and ascending aorta are structurally normal, with no  evidence of dilitation. Venous: The inferior vena cava is normal in size with greater than 50% respiratory variability, suggesting right atrial pressure of 3 mmHg. IAS/Shunts: No atrial level  shunt detected by color flow Doppler. Additional Comments: There is a moderate pleural effusion in the left lateral region.  LEFT VENTRICLE PLAX 2D LVIDd:         4.00 cm     Diastology LVIDs:         2.80 cm     LV e' medial:    5.62 cm/s LV PW:         1.30 cm     LV E/e' medial:  18.5 LV IVS:        1.40 cm     LV e' lateral:   6.96 cm/s LVOT diam:     1.70 cm     LV E/e' lateral: 14.9 LV SV:         39 LV SV Index:   25 LVOT Area:     2.27 cm  LV Volumes (MOD) LV vol d, MOD A2C: 33.7 ml LV vol d, MOD A4C: 28.9 ml LV vol s, MOD A2C: 13.4 ml LV vol s, MOD A4C: 12.1 ml LV SV MOD A2C:     20.3 ml LV SV MOD A4C:     28.9 ml LV SV MOD BP:      18.1 ml RIGHT VENTRICLE RV S prime:     10.23 cm/s TAPSE (M-mode): 1.7 cm LEFT ATRIUM             Index        RIGHT ATRIUM           Index LA diam:        2.90 cm 1.85 cm/m   RA Area:     16.00 cm LA Vol (A2C):   34.5 ml 22.05 ml/m  RA Volume:   42.90 ml  27.42 ml/m LA Vol (A4C):   62.8 ml 40.14 ml/m LA Biplane Vol: 48.8 ml 31.19 ml/m  AORTIC VALVE AV Area (Vmax):    1.87 cm AV Area (Vmean):   1.77 cm AV Area (VTI):     2.04 cm AV Vmax:           103.80 cm/s AV Vmean:          78.600 cm/s AV VTI:            0.193 m AV Peak Grad:      4.3 mmHg AV Mean Grad:      2.7 mmHg LVOT Vmax:         85.30 cm/s LVOT Vmean:        61.150 cm/s LVOT VTI:          0.174 m LVOT/AV VTI ratio: 0.90  AORTA Ao Root diam: 2.80 cm Ao Asc diam:  2.90 cm MITRAL VALVE MV Area (PHT): 4.36 cm     SHUNTS MV Decel Time: 174 msec     Systemic VTI:  0.17 m MR Peak grad: 15.2 mmHg     Systemic Diam: 1.70 cm MR Vmax:      195.00 cm/s MV E velocity: 104.00 cm/s MV A velocity: 23.30 cm/s MV E/A ratio:  4.46 Mihai Croitoru MD Electronically signed by Thurmon Fair MD Signature Date/Time: 09/20/2022/10:29:01 AM     Final    CT Angio Chest PE W and/or Wo Contrast  Result Date: 09/19/2022 CLINICAL DATA:  Pulmonary embolism (PE) suspected, high prob Shortness of breath. EXAM: CT ANGIOGRAPHY CHEST WITH CONTRAST TECHNIQUE: Multidetector CT imaging of the chest was performed using the standard protocol during bolus administration of intravenous contrast. Multiplanar CT image reconstructions and MIPs were  obtained to evaluate the vascular anatomy. RADIATION DOSE REDUCTION: This exam was performed according to the departmental dose-optimization program which includes automated exposure control, adjustment of the mA and/or kV according to patient size and/or use of iterative reconstruction technique. CONTRAST:  75mL OMNIPAQUE IOHEXOL 350 MG/ML SOLN COMPARISON:  Radiograph earlier today FINDINGS: Cardiovascular: There are no filling defects within the pulmonary arteries to suggest pulmonary embolus. Atherosclerosis of the thoracic aorta with tortuosity. Multi chamber cardiomegaly. Trace pericardial effusion. There are coronary artery calcifications. Mediastinum/Nodes: Patulous esophagus without wall thickening. No mediastinal or hilar adenopathy. No thyroid nodule. Lungs/Pleura: Moderate bilateral pleural effusions with associated compressive atelectasis. Occasional areas of septal thickening and ground-glass opacities suspicious for pulmonary edema. There is biapical pleuroparenchymal scarring. Trachea and central airways are clear. Upper Abdomen: No acute findings. Musculoskeletal: T11 superior endplate compression deformity appears subacute. Mild T7 superior endplate compression deformity which is age indeterminate. Review of the MIP images confirms the above findings. IMPRESSION: 1. No pulmonary embolus. 2. Congestive heart failure. Multi chamber cardiomegaly with moderate bilateral pleural effusions and pulmonary edema. 3. T11 superior endplate compression deformity appears subacute. Mild T7 superior endplate compression  deformity which is age indeterminate. Aortic Atherosclerosis (ICD10-I70.0). Electronically Signed   By: Narda Rutherford M.D.   On: 09/19/2022 16:09   DG Chest Portable 1 View  Result Date: 09/19/2022 CLINICAL DATA:  Shortness of breath. EXAM: PORTABLE CHEST 1 VIEW COMPARISON:  None Available. FINDINGS: Mild cardiomegaly and pulmonary vascular congestion. Small bilateral pleural effusions with bibasilar opacities. No pneumothorax. No acute osseous abnormality. IMPRESSION: 1. Mild congestive heart failure. Electronically Signed   By: Obie Dredge M.D.   On: 09/19/2022 13:45      Scheduled Meds:  atorvastatin  40 mg Oral Q supper   folic acid  1 mg Oral Daily   gabapentin  100-300 mg Oral QHS   levothyroxine  50 mcg Oral QAC breakfast   metoprolol succinate  25 mg Oral Daily   pantoprazole  40 mg Oral Daily   polyvinyl alcohol  1 drop Both Eyes 6 X Daily   Continuous Infusions:  0.9 % NaCl with KCl 20 mEq / L 75 mL/hr at 09/21/22 0945     LOS: 2 days   Time spent: 30 minutes   Noralee Stain, DO [facility] 09/21/2022, 1:20 PM   Available via Epic secure chat 7am-7pm After these hours, please refer to coverage provider listed on [email or web address]

## 2022-09-21 NOTE — Progress Notes (Signed)
Patient's HR has been in 100's most of the night, except when she got up went up to 140's and came back down to 110 when she was back in bed. Plan of care continues.

## 2022-09-21 NOTE — Interval H&P Note (Signed)
History and Physical Interval Note:  09/21/2022 2:45 PM  Sara Snow  has presented today for surgery, with the diagnosis of anemia.  The various methods of treatment have been discussed with the patient and family. After consideration of risks, benefits and other options for treatment, the patient has consented to  Procedure(s): ESOPHAGOGASTRODUODENOSCOPY (EGD) WITH PROPOFOL (N/A) as a surgical intervention.  The patient's history has been reviewed, patient examined, no change in status, stable for surgery.  I have reviewed the patient's chart and labs.  Questions were answered to the patient's satisfaction.     Jenel Lucks

## 2022-09-21 NOTE — Progress Notes (Signed)
DAILY PROGRESS NOTE   Patient Name: Sara Snow Date of Encounter: 09/21/2022 Cardiologist: None  Chief Complaint   No complaints  Patient Profile   82 yo female with progressive DOE, orthopnea, weight gain and fatigue, presented to PCP and found to be in afib with RVR   Subjective   Remains in rate-controlled afib. Plan for EGD today - will transition from heparin to DOAC prior to d/c.  Hemoglobin slightly lower today. Creatinine 1.24, however, she is being hydrated. SBP improved today.   Objective   Vitals:   09/20/22 2355 09/21/22 0445 09/21/22 0608 09/21/22 0811  BP: 111/79 138/82  (!) 128/92  Pulse: (!) 101 (!) 106  100  Resp: 19 19  20   Temp: 98.2 F (36.8 C) 97.9 F (36.6 C)  97.7 F (36.5 C)  TempSrc: Oral Oral  Oral  SpO2: 97% 96%  95%  Weight:  55.1 kg 54.8 kg   Height:        Intake/Output Summary (Last 24 hours) at 09/21/2022 1610 Last data filed at 09/21/2022 9604 Gross per 24 hour  Intake 610.52 ml  Output 1751 ml  Net -1140.48 ml   Filed Weights   09/20/22 0124 09/21/22 0445 09/21/22 5409  Weight: 55.1 kg 55.1 kg 54.8 kg    Physical Exam   General appearance: alert and no distress Neck: no carotid bruit, no JVD, and thyroid not enlarged, symmetric, no tenderness/mass/nodules Lungs: clear to auscultation bilaterally Heart: irregularly irregular rhythm Abdomen: soft, non-tender; bowel sounds normal; no masses,  no organomegaly Extremities: extremities normal, atraumatic, no cyanosis or edema Pulses: 2+ and symmetric Skin: Skin color, texture, turgor normal. No rashes or lesions Neurologic: Grossly normal Psych: Pleasant  Inpatient Medications    Scheduled Meds:  atorvastatin  40 mg Oral Q supper   folic acid  1 mg Oral Daily   gabapentin  100-300 mg Oral QHS   levothyroxine  50 mcg Oral QAC breakfast   metoprolol succinate  25 mg Oral Daily   pantoprazole  40 mg Oral Daily   polyvinyl alcohol  1 drop Both Eyes 6 X Daily     Continuous Infusions:  0.9 % NaCl with KCl 20 mEq / L      PRN Meds: oxyCODONE-acetaminophen   Labs   Results for orders placed or performed during the hospital encounter of 09/19/22 (from the past 48 hour(s))  Brain natriuretic peptide     Status: Abnormal   Collection Time: 09/19/22 12:47 PM  Result Value Ref Range   B Natriuretic Peptide 677.8 (H) 0.0 - 100.0 pg/mL    Comment: Performed at [facility], [address], [place], [place] [address]  Basic metabolic panel     Status: Abnormal   Collection Time: 09/19/22 12:52 PM  Result Value Ref Range   Sodium 127 (L) 135 - 145 mmol/L   Potassium 4.3 3.5 - 5.1 mmol/L   Chloride 97 (L) 98 - 111 mmol/L   CO2 19 (L) 22 - 32 mmol/L   Glucose, Bld 93 70 - 99 mg/dL    Comment: Glucose reference range applies only to samples taken after fasting for at least 8 hours.   BUN 18 8 - 23 mg/dL   Creatinine, Ser 4.78 0.44 - 1.00 mg/dL   Calcium 8.7 (L) 8.9 - 10.3 mg/dL   GFR, Estimated >29 >56 mL/min    Comment: (NOTE) Calculated using the CKD-EPI Creatinine Equation (2021)    Anion gap 11 5 - 15  Comment: Performed at [facility], [address], [place], [place] [address]  CBC with Differential     Status: Abnormal   Collection Time: 09/19/22 12:52 PM  Result Value Ref Range   WBC 5.5 4.0 - 10.5 K/uL   RBC 3.36 (L) 3.87 - 5.11 MIL/uL   Hemoglobin 10.3 (L) 12.0 - 15.0 g/dL   HCT 02.7 (L) 25.3 - 66.4 %   MCV 92.9 80.0 - 100.0 fL   MCH 30.7 26.0 - 34.0 pg   MCHC 33.0 30.0 - 36.0 g/dL   RDW 40.3 47.4 - 25.9 %   Platelets 224 150 - 400 K/uL   nRBC 0.0 0.0 - 0.2 %   Neutrophils Relative % 73 %   Neutro Abs 4.1 1.7 - 7.7 K/uL   Lymphocytes Relative 16 %   Lymphs Abs 0.9 0.7 - 4.0 K/uL   Monocytes Relative 8 %   Monocytes Absolute 0.4 0.1 - 1.0 K/uL   Eosinophils Relative 2 %   Eosinophils Absolute 0.1 0.0 - 0.5 K/uL   Basophils Relative 1 %   Basophils Absolute 0.0 0.0 - 0.1 K/uL   Immature Granulocytes 0 %    Abs Immature Granulocytes 0.02 0.00 - 0.07 K/uL    Comment: Performed at [facility], [address], [place], [place] [address]  Troponin I (High Sensitivity)     Status: Abnormal   Collection Time: 09/19/22 12:52 PM  Result Value Ref Range   Troponin I (High Sensitivity) 33 (H) <18 ng/L    Comment: (NOTE) Elevated high sensitivity troponin I (hsTnI) values and significant  changes across serial measurements may suggest ACS but many other  chronic and acute conditions are known to elevate hsTnI results.  Refer to the "Links" section for chest pain algorithms and additional  guidance. Performed at [facility], [address], [place], [place] [address]   Hepatic function panel     Status: Abnormal   Collection Time: 09/19/22 12:52 PM  Result Value Ref Range   Total Protein 6.6 6.5 - 8.1 g/dL   Albumin 3.4 (L) 3.5 - 5.0 g/dL   AST 45 (H) 15 - 41 U/L   ALT 34 0 - 44 U/L   Alkaline Phosphatase 139 (H) 38 - 126 U/L   Total Bilirubin 1.4 (H) 0.3 - 1.2 mg/dL   Bilirubin, Direct 0.3 (H) 0.0 - 0.2 mg/dL   Indirect Bilirubin 1.1 (H) 0.3 - 0.9 mg/dL    Comment: Performed at [facility], [address] [place], [place] [address]  Ammonia     Status: None   Collection Time: 09/19/22 12:57 PM  Result Value Ref Range   Ammonia <10 9 - 35 umol/L    Comment: Performed at [facility], [address], [place], [place] [address]  POC occult blood, ED     Status: None   Collection Time: 09/19/22  2:53 PM  Result Value Ref Range   Fecal Occult Bld NEGATIVE NEGATIVE  Troponin I (High Sensitivity)     Status: Abnormal   Collection Time: 09/19/22  2:55 PM  Result Value Ref Range   Troponin I (High Sensitivity) 38 (H) <18 ng/L    Comment: (NOTE) Elevated high sensitivity troponin I (hsTnI) values and significant  changes across serial measurements may suggest ACS but many other  chronic and acute conditions are known to elevate hsTnI results.  Refer to the "Links"  section for chest pain algorithms and additional  guidance. Performed at [facility], [address], [place], [place] [address]   SARS Coronavirus 2 by RT PCR (hospital order, performed in [facility] hospital lab) *cepheid single result test* Anterior Nasal Swab     Status: None   Collection Time: 09/19/22  3:42 PM   Specimen: Anterior Nasal Swab  Result Value Ref Range   SARS Coronavirus 2 by RT PCR NEGATIVE NEGATIVE    Comment: Performed at [facility], [address], [place], [place] [address]  CBC     Status: Abnormal   Collection Time: 09/20/22 12:49 AM  Result Value Ref Range   WBC 9.9 4.0 - 10.5 K/uL   RBC 3.70 (L) 3.87 - 5.11 MIL/uL   Hemoglobin 11.4 (L) 12.0 - 15.0 g/dL   HCT 82.9 (L) 56.2 - 13.0 %   MCV 92.2 80.0 - 100.0 fL   MCH 30.8 26.0 - 34.0 pg   MCHC 33.4 30.0 - 36.0 g/dL   RDW 86.5 78.4 - 69.6 %   Platelets 243 150 - 400 K/uL   nRBC 0.0 0.0 - 0.2 %    Comment: Performed at [facility], [address], [place], [place] [address]  Creatinine, serum     Status: None   Collection Time: 09/20/22 12:49 AM  Result Value Ref Range   Creatinine, Ser 0.85 0.44 - 1.00 mg/dL   GFR, Estimated >41 >32 mL/min    Comment: (NOTE) Calculated using the CKD-EPI Creatinine Equation (2021) Performed at [facility], [address] [place], [place] [address]   TSH     Status: Abnormal   Collection Time: 09/20/22  4:27 AM  Result Value Ref Range   TSH 5.772 (H) 0.350 - 4.500 uIU/mL    Comment: Performed by a 3rd Generation assay with a functional sensitivity of <=0.01 uIU/mL. Performed at [facility], [address] [place], [place] [address]   T4, free     Status: Abnormal   Collection Time: 09/20/22  4:27 AM  Result Value Ref Range   Free T4 1.76 (H) 0.61 - 1.12 ng/dL    Comment: (NOTE) Biotin ingestion may interfere with free T4 tests. If the results are inconsistent with the TSH level, previous test results, or the clinical  presentation, then consider biotin interference. If needed, order repeat testing after stopping biotin. Performed at [facility], [address] [place], [place] [address]   D-dimer, quantitative     Status: Abnormal   Collection Time: 09/20/22  4:27 AM  Result Value Ref Range   D-Dimer, Quant 0.58 (H) 0.00 - 0.50 ug/mL-FEU    Comment: (NOTE) At the manufacturer cut-off value of 0.5 g/mL FEU, this assay has a negative predictive value of 95-100%.This assay is intended for use in conjunction with a clinical pretest probability (PTP) assessment model to exclude pulmonary embolism (PE) and deep venous thrombosis (DVT) in outpatients suspected of PE or DVT. Results should be correlated with clinical presentation. Performed at [facility], [address] [place], [place] [address]   Heparin level (unfractionated)     Status: None   Collection Time: 09/20/22  4:27 AM  Result Value Ref Range   Heparin Unfractionated 0.44 0.30 - 0.70 IU/mL    Comment: (NOTE) The clinical reportable range upper limit is being lowered to >1.10 to align with the FDA approved guidance for the current laboratory assay.  If heparin results are below expected values, and patient dosage  has  been confirmed, suggest follow up testing of antithrombin III levels. Performed at [facility], [address] [place], [place] [address]   Heparin level (unfractionated)     Status: None   Collection Time: 09/20/22 12:44 PM  Result Value Ref Range   Heparin Unfractionated 0.56 0.30 - 0.70 IU/mL    Comment: (NOTE) The clinical reportable range upper limit is being lowered to >1.10 to align with the FDA approved guidance for the current laboratory assay.  If heparin results are below expected values, and patient dosage has  been confirmed, suggest follow up testing of antithrombin III levels. Performed at [facility], [address], [place], [place] [address]   Heparin level  (unfractionated)     Status: None   Collection Time: 09/20/22 11:59 PM  Result Value Ref Range   Heparin Unfractionated 0.53 0.30 - 0.70 IU/mL    Comment: (NOTE) The clinical reportable range upper limit is being lowered to >1.10 to align with the FDA approved guidance for the current laboratory assay.  If heparin results are below expected values, and patient dosage has  been confirmed, suggest follow up testing of antithrombin III levels. Performed at [facility], [address], [place], [place] [address]   CBC     Status: Abnormal   Collection Time: 09/20/22 11:59 PM  Result Value Ref Range   WBC 6.7 4.0 - 10.5 K/uL   RBC 3.58 (L) 3.87 - 5.11 MIL/uL   Hemoglobin 10.9 (L) 12.0 - 15.0 g/dL   HCT 44.0 (L) 34.7 - 42.5 %   MCV 89.7 80.0 - 100.0 fL   MCH 30.4 26.0 - 34.0 pg   MCHC 34.0 30.0 - 36.0 g/dL   RDW 95.6 38.7 - 56.4 %   Platelets 249 150 - 400 K/uL   nRBC 0.0 0.0 - 0.2 %    Comment: Performed at [facility], [address], [place], [place] [address]  Basic metabolic panel     Status: Abnormal   Collection Time: 09/20/22 11:59 PM  Result Value Ref Range   Sodium 128 (L) 135 - 145 mmol/L   Potassium 3.4 (L) 3.5 - 5.1 mmol/L   Chloride 91 (L) 98 - 111 mmol/L   CO2 22 22 - 32 mmol/L   Glucose, Bld 128 (H) 70 - 99 mg/dL    Comment: Glucose reference range applies only to samples taken after fasting for at least 8 hours.   BUN 28 (H) 8 - 23 mg/dL   Creatinine, Ser 1.88 (H) 0.44 - 1.00 mg/dL   Calcium 8.3 (L) 8.9 - 10.3 mg/dL   GFR, Estimated 44 (L) >60 mL/min    Comment: (NOTE) Calculated using the CKD-EPI Creatinine Equation (2021)    Anion gap 15 5 - 15    Comment: Performed at [facility], [address], [place], [place] [address]  Magnesium     Status: None   Collection Time: 09/20/22 11:59 PM  Result Value Ref Range   Magnesium 1.8 1.7 - 2.4 mg/dL    Comment: Performed at [facility], [address], [place], [place] [address]     ECG   AFib at 134 - Personally Reviewed  Telemetry   Afib with CVR - Personally Reviewed  Radiology    ECHOCARDIOGRAM COMPLETE  Result Date: 09/20/2022    ECHOCARDIOGRAM REPORT   Patient Name:   DONIQUE HAMMONDS Date of Exam: 09/20/2022 Medical Rec #:  [id number]  Height:       63.0 in Accession #:    [id number]        Weight:       121.5 lb Date of Birth:  02-Nov-1940        BSA:          1.564 m Patient Age:    81 years          BP:           135/86 mmHg Patient Gender: F                 HR:           96 bpm. 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 CHF, Stroke, Arrythmias:Atrial Fibrillation,                 Signs/Symptoms:Edema and Shortness of Breath; Risk                 Factors:Hypertension and Non-Smoker.  Sonographer:    Jake Seats RDMS, RVT, RDCS Referring Phys: [id number] [facility] [place]  Sonographer Comments: Global longitudinal strain was attempted. IMPRESSIONS  1. Left ventricular ejection fraction, by estimation, is 65 to 70%. The left ventricle has normal function. The left ventricle has no regional wall motion abnormalities. There is mild concentric left ventricular hypertrophy. Left ventricular diastolic function could not be evaluated.  2. Right ventricular systolic function is normal. The right ventricular size is normal. Tricuspid regurgitation signal is inadequate for assessing PA pressure.  3. Left atrial size was mildly dilated.  4. Moderate pleural effusion in the left lateral region.  5. The mitral valve is normal in structure. Mild mitral valve regurgitation.  6. The aortic valve is tricuspid. Aortic valve regurgitation is not visualized. No aortic stenosis is present.  7. The inferior vena cava is normal in  size with greater than 50% respiratory variability, suggesting right atrial pressure of 3 mmHg. Comparison(s): Echocardiogram done at [facility] on 04/19/21 showed an EF of 60-65%. FINDINGS  Left Ventricle: Apex was suboptimally seen, otherwise normal LV wall motion. Left ventricular ejection fraction, by estimation, is 65 to 70%. The left ventricle has normal function. The left ventricle has no regional wall motion abnormalities. Global longitudinal strain performed but not reported based on interpreter judgement due to suboptimal tracking. The left ventricular internal cavity size was normal in size. There is mild concentric left ventricular hypertrophy. Left ventricular diastolic function could not be evaluated due to atrial fibrillation. Left ventricular diastolic function could not be evaluated. Right Ventricle: The right ventricular size is normal. No increase in right ventricular wall thickness. Right ventricular systolic function is normal. Tricuspid regurgitation signal is inadequate for assessing PA pressure. Left Atrium: Left atrial size was mildly dilated. Right Atrium: Right atrial size was normal in size. Pericardium: Trivial pericardial effusion is present. Mitral Valve: The mitral valve is normal in structure. Mild mitral valve regurgitation, with posteriorly-directed jet. Tricuspid Valve: The tricuspid valve is normal in structure. Tricuspid valve regurgitation is not demonstrated. Aortic Valve: The aortic valve is tricuspid. Aortic valve regurgitation is not visualized. No aortic  stenosis is present. Aortic valve mean gradient measures 2.7 mmHg. Aortic valve peak gradient measures 4.3 mmHg. Aortic valve area, by VTI measures 2.04 cm. Pulmonic Valve: The pulmonic valve was grossly normal. Pulmonic valve regurgitation is trivial. No evidence of pulmonic stenosis. Aorta: The aortic root and ascending aorta are structurally normal, with no evidence of dilitation. Venous: The inferior vena cava is normal in  size with greater than 50% respiratory variability, suggesting right atrial pressure of 3 mmHg. IAS/Shunts: No atrial level shunt detected by color flow Doppler. Additional Comments: There is a moderate pleural effusion in the left lateral region.  LEFT VENTRICLE PLAX 2D LVIDd:         4.00 cm     Diastology LVIDs:         2.80 cm     LV e' medial:    5.62 cm/s LV PW:         1.30 cm     LV E/e' medial:  18.5 LV IVS:        1.40 cm     LV e' lateral:   6.96 cm/s LVOT diam:     1.70 cm     LV E/e' lateral: 14.9 LV SV:         39 LV SV Index:   25 LVOT Area:     2.27 cm  LV Volumes (MOD) LV vol d, MOD A2C: 33.7 ml LV vol d, MOD A4C: 28.9 ml LV vol s, MOD A2C: 13.4 ml LV vol s, MOD A4C: 12.1 ml LV SV MOD A2C:     20.3 ml LV SV MOD A4C:     28.9 ml LV SV MOD BP:      18.1 ml RIGHT VENTRICLE RV S prime:     10.23 cm/s TAPSE (M-mode): 1.7 cm LEFT ATRIUM             Index        RIGHT ATRIUM           Index LA diam:        2.90 cm 1.85 cm/m   RA Area:     16.00 cm LA Vol (A2C):   34.5 ml 22.05 ml/m  RA Volume:   42.90 ml  27.42 ml/m LA Vol (A4C):   62.8 ml 40.14 ml/m LA Biplane Vol: 48.8 ml 31.19 ml/m  AORTIC VALVE AV Area (Vmax):    1.87 cm AV Area (Vmean):   1.77 cm AV Area (VTI):     2.04 cm AV Vmax:           103.80 cm/s AV Vmean:          78.600 cm/s AV VTI:            0.193 m AV Peak Grad:      4.3 mmHg AV Mean Grad:      2.7 mmHg LVOT Vmax:         85.30 cm/s LVOT Vmean:        61.150 cm/s LVOT VTI:          0.174 m LVOT/AV VTI ratio: 0.90  AORTA Ao Root diam: 2.80 cm Ao Asc diam:  2.90 cm MITRAL VALVE MV Area (PHT): 4.36 cm     SHUNTS MV Decel Time: 174 msec     Systemic VTI:  0.17 m MR Peak grad: 15.2 mmHg     Systemic Diam: 1.70 cm MR Vmax:      195.00 cm/s MV E velocity: 104.00 cm/s MV A  velocity: 23.30 cm/s MV E/A ratio:  4.46 Mihai Croitoru MD Electronically signed by Thurmon Fair MD Signature Date/Time: 09/20/2022/10:29:01 AM    Final    CT Angio Chest PE W and/or Wo Contrast  Result Date:  09/19/2022 CLINICAL DATA:  Pulmonary embolism (PE) suspected, high prob Shortness of breath. EXAM: CT ANGIOGRAPHY CHEST WITH CONTRAST TECHNIQUE: Multidetector CT imaging of the chest was performed using the standard protocol during bolus administration of intravenous contrast. Multiplanar CT image reconstructions and MIPs were obtained to evaluate the vascular anatomy. RADIATION DOSE REDUCTION: This exam was performed according to the departmental dose-optimization program which includes automated exposure control, adjustment of the mA and/or kV according to patient size and/or use of iterative reconstruction technique. CONTRAST:  75mL OMNIPAQUE IOHEXOL 350 MG/ML SOLN COMPARISON:  Radiograph earlier today FINDINGS: Cardiovascular: There are no filling defects within the pulmonary arteries to suggest pulmonary embolus. Atherosclerosis of the thoracic aorta with tortuosity. Multi chamber cardiomegaly. Trace pericardial effusion. There are coronary artery calcifications. Mediastinum/Nodes: Patulous esophagus without wall thickening. No mediastinal or hilar adenopathy. No thyroid nodule. Lungs/Pleura: Moderate bilateral pleural effusions with associated compressive atelectasis. Occasional areas of septal thickening and ground-glass opacities suspicious for pulmonary edema. There is biapical pleuroparenchymal scarring. Trachea and central airways are clear. Upper Abdomen: No acute findings. Musculoskeletal: T11 superior endplate compression deformity appears subacute. Mild T7 superior endplate compression deformity which is age indeterminate. Review of the MIP images confirms the above findings. IMPRESSION: 1. No pulmonary embolus. 2. Congestive heart failure. Multi chamber cardiomegaly with moderate bilateral pleural effusions and pulmonary edema. 3. T11 superior endplate compression deformity appears subacute. Mild T7 superior endplate compression deformity which is age indeterminate. Aortic Atherosclerosis  (ICD10-I70.0). Electronically Signed   By: Narda Rutherford M.D.   On: 09/19/2022 16:09   DG Chest Portable 1 View  Result Date: 09/19/2022 CLINICAL DATA:  Shortness of breath. EXAM: PORTABLE CHEST 1 VIEW COMPARISON:  None Available. FINDINGS: Mild cardiomegaly and pulmonary vascular congestion. Small bilateral pleural effusions with bibasilar opacities. No pneumothorax. No acute osseous abnormality. IMPRESSION: 1. Mild congestive heart failure. Electronically Signed   By: Obie Dredge M.D.   On: 09/19/2022 13:45    Cardiac Studies   See echo above  Assessment   Principal Problem:   Atrial fibrillation with rapid ventricular response (HCC) Active Problems:   Acute congestive heart failure (HCC)   Uncontrolled hypertension   Anemia   Acute diastolic heart failure (HCC)   Plan   A-fib with rapid ventricular response HR better controlled, but hypotensive today- stop diltiazem. On home metoprolol. Discussed management options, but in light of normal LV function on echo, no urgent need for cardioversion. Would prefer to see if she tolerates anticoagulation before committing her to it (such as if she had cardioversion, due to the increased stroke risk of the procedure). Will continue IV heparin and transition to DOAC at discharge. HR is a little elevated - may need to increase Toprol to 50 mg daily tomorrow. Acute congestive heart failure CT shows biventricular dilatation , however, echo showed supranormal LV function, therefore, suspect some diastolic CHF in the setting of afib. Euvolemic today and hypotensive- will hold further diuresis. Now getting IV fluids Uncontrolled hypertension Now hypotensive today- hold cardizem. On metoprolol.  Anemia ? GIB history - seen by GI today- plan for EGD today . Will continue IV Heparin and PPI - transition to DOAC prior to d/c.  Time Spent Directly with Patient:  I have spent a total of 25 minutes  with the patient reviewing hospital notes,  telemetry, EKGs, labs and examining the patient as well as establishing an assessment and plan that was discussed personally with the patient.  > 50% of time was spent in direct patient care.  Length of Stay:  LOS: 2 days   Chrystie Nose, MD, [facility], FACP  [place]  [facility]  Medical Director of the Advanced Lipid Disorders &  Cardiovascular Risk Reduction Clinic Diplomate of the American Board of Clinical Lipidology Attending Cardiologist  Direct Dial: [phone]  Fax: [phone]  Website:  [email or web address] Nicely Pravin Perezperez 09/21/2022, 9:05 AM

## 2022-09-21 NOTE — Anesthesia Preprocedure Evaluation (Addendum)
Anesthesia Evaluation  Patient identified by MRN, date of birth, ID band Patient awake    Reviewed: Allergy & Precautions, NPO status , Patient's Chart, lab work & pertinent test results, reviewed documented beta blocker date and time   Airway Mallampati: III  TM Distance: >3 FB Neck ROM: Limited    Dental no notable dental hx. (+) Dental Advisory Given   Pulmonary sleep apnea and Continuous Positive Airway Pressure Ventilation    Pulmonary exam normal breath sounds clear to auscultation       Cardiovascular hypertension, Pt. on home beta blockers and Pt. on medications +CHF   Rhythm:Irregular Rate:Tachycardia  Echo 09/2022  1. Left ventricular ejection fraction, by estimation, is 65 to 70%. The left ventricle has normal function. The left ventricle has no regional wall motion abnormalities. There is mild concentric left ventricular hypertrophy. Left ventricular diastolic function could not be evaluated.   2. Right ventricular systolic function is normal. The right ventricular size is normal. Tricuspid regurgitation signal is inadequate for assessing PA pressure.   3. Left atrial size was mildly dilated.   4. Moderate pleural effusion in the left lateral region.   5. The mitral valve is normal in structure. Mild mitral valve regurgitation.   6. The aortic valve is tricuspid. Aortic valve regurgitation is not visualized. No aortic stenosis is present.   7. The inferior vena cava is normal in size with greater than 50% respiratory variability, suggesting right atrial pressure of 3 mmHg.   Comparison(s): Echocardiogram done at [facility] on 04/19/21 showed an EF of 60-65%.      Neuro/Psych CVA    GI/Hepatic Neg liver ROS,GERD  Medicated and Controlled,,  Endo/Other  Hypothyroidism    Renal/GU negative Renal ROS     Musculoskeletal  (+) Arthritis ,    Abdominal   Peds  Hematology  (+) Blood dyscrasia, anemia   Anesthesia Other  Findings   Reproductive/Obstetrics                             Anesthesia Physical Anesthesia Plan  ASA: 3  Anesthesia Plan: MAC   Post-op Pain Management: Minimal or no pain anticipated   Induction: Intravenous  PONV Risk Score and Plan: Propofol infusion, TIVA and Treatment may vary due to age or medical condition  Airway Management Planned: Natural Airway  Additional Equipment:   Intra-op Plan:   Post-operative Plan:   Informed Consent: I have reviewed the patients History and Physical, chart, labs and discussed the procedure including the risks, benefits and alternatives for the proposed anesthesia with the patient or authorized representative who has indicated his/her understanding and acceptance.     Dental advisory given  Plan Discussed with: CRNA  Anesthesia Plan Comments:        Anesthesia Quick Evaluation

## 2022-09-21 NOTE — Transfer of Care (Signed)
Immediate Anesthesia Transfer of Care Note  Patient: Dim Meisinger  Procedure(s) Performed: ESOPHAGOGASTRODUODENOSCOPY (EGD) WITH PROPOFOL  Patient Location: PACU  Anesthesia Type:MAC  Level of Consciousness: awake, oriented, and drowsy  Airway & Oxygen Therapy: Patient Spontanous Breathing  Post-op Assessment: Report given to RN and Post -op Vital signs reviewed and stable  Post vital signs: Reviewed and stable  Last Vitals:  Vitals Value Taken Time  BP 109/67 09/21/22 1520  Temp 36.7 C 09/21/22 1520  Pulse 106 09/21/22 1522  Resp 27 09/21/22 1522  SpO2 93 % 09/21/22 1522  Vitals shown include unvalidated device data.  Last Pain:  Vitals:   09/21/22 1336  TempSrc: Temporal  PainSc: 0-No pain         Complications: No notable events documented.

## 2022-09-22 ENCOUNTER — Other Ambulatory Visit (HOSPITAL_COMMUNITY): Payer: Self-pay

## 2022-09-22 ENCOUNTER — Encounter (HOSPITAL_COMMUNITY): Payer: Self-pay | Admitting: Gastroenterology

## 2022-09-22 DIAGNOSIS — I4891 Unspecified atrial fibrillation: Secondary | ICD-10-CM | POA: Diagnosis not present

## 2022-09-22 DIAGNOSIS — I5031 Acute diastolic (congestive) heart failure: Secondary | ICD-10-CM | POA: Diagnosis not present

## 2022-09-22 LAB — CBC
HCT: 35.5 % — ABNORMAL LOW (ref 36.0–46.0)
Hemoglobin: 11.6 g/dL — ABNORMAL LOW (ref 12.0–15.0)
MCH: 30.5 pg (ref 26.0–34.0)
MCHC: 32.7 g/dL (ref 30.0–36.0)
MCV: 93.4 fL (ref 80.0–100.0)
Platelets: 257 10*3/uL (ref 150–400)
RBC: 3.8 MIL/uL — ABNORMAL LOW (ref 3.87–5.11)
RDW: 13.2 % (ref 11.5–15.5)
WBC: 8.6 10*3/uL (ref 4.0–10.5)
nRBC: 0 % (ref 0.0–0.2)

## 2022-09-22 LAB — BASIC METABOLIC PANEL
Anion gap: 9 (ref 5–15)
BUN: 14 mg/dL (ref 8–23)
CO2: 22 mmol/L (ref 22–32)
Calcium: 8.9 mg/dL (ref 8.9–10.3)
Chloride: 100 mmol/L (ref 98–111)
Creatinine, Ser: 1.01 mg/dL — ABNORMAL HIGH (ref 0.44–1.00)
GFR, Estimated: 56 mL/min — ABNORMAL LOW (ref >60)
Glucose, Bld: 95 mg/dL (ref 70–99)
Potassium: 4.2 mmol/L (ref 3.5–5.1)
Sodium: 131 mmol/L — ABNORMAL LOW (ref 135–145)

## 2022-09-22 LAB — MAGNESIUM: Magnesium: 2 mg/dL (ref 1.7–2.4)

## 2022-09-22 LAB — HEPARIN LEVEL (UNFRACTIONATED): Heparin Unfractionated: 0.47 IU/mL (ref 0.30–0.70)

## 2022-09-22 MED ORDER — APIXABAN 2.5 MG PO TABS
2.5000 mg | ORAL_TABLET | Freq: Two times a day (BID) | ORAL | 1 refills | Status: DC
Start: 2022-09-22 — End: 2024-01-10
  Filled 2022-09-22: qty 60, 30d supply, fill #0

## 2022-09-22 MED ORDER — GABAPENTIN 100 MG PO CAPS
100.0000 mg | ORAL_CAPSULE | Freq: Once | ORAL | Status: AC
  Administered 2022-09-22: 100 mg via ORAL
  Filled 2022-09-22: qty 1

## 2022-09-22 MED ORDER — PANTOPRAZOLE SODIUM 40 MG PO TBEC
40.0000 mg | DELAYED_RELEASE_TABLET | Freq: Every day | ORAL | 1 refills | Status: AC
  Filled 2022-09-22: qty 30, 30d supply, fill #0

## 2022-09-22 MED ORDER — METOPROLOL SUCCINATE ER 50 MG PO TB24: 50.0000 mg | ORAL_TABLET | Freq: Every day | ORAL | Status: DC

## 2022-09-22 MED ORDER — METOPROLOL SUCCINATE ER 25 MG PO TB24
25.0000 mg | ORAL_TABLET | ORAL | Status: AC
  Administered 2022-09-22: 25 mg via ORAL
  Filled 2022-09-22: qty 1

## 2022-09-22 MED ORDER — GABAPENTIN 100 MG PO CAPS: 200.0000 mg | ORAL_CAPSULE | Freq: Every evening | ORAL | Status: DC | PRN

## 2022-09-22 MED ORDER — METOPROLOL SUCCINATE ER 50 MG PO TB24
50.0000 mg | ORAL_TABLET | Freq: Every day | ORAL | 1 refills | Status: DC
  Filled 2022-09-22: qty 30, 30d supply, fill #0
  Filled 2022-11-23: qty 30, 30d supply, fill #1

## 2022-09-22 MED ORDER — APIXABAN 2.5 MG PO TABS
2.5000 mg | ORAL_TABLET | Freq: Two times a day (BID) | ORAL | Status: DC
  Administered 2022-09-22: 2.5 mg via ORAL
  Filled 2022-09-22: qty 1

## 2022-09-22 NOTE — Discharge Summary (Signed)
Physician Discharge Summary  Kathern Lobosco VZD:[id number] DOB: 11-13-1940 DOA: 09/19/2022  PCP: Juanita Laster, MD  Admit date: 09/19/2022 Discharge date: 09/22/2022  Admitted From: Home Disposition:  Home  Recommendations for Outpatient Follow-up:  Follow up with PCP in 1 week Follow up with cardiology outpatient in 4 to 6 weeks Follow-up with GI as needed  Discharge Condition: Stable CODE STATUS: Full code Diet recommendation: Heart healthy diet  Brief/Interim Summary: Bertine Schlottman is a 82 y.o. female with medical history significant of hypertension, hypothyroidism, history of stroke presents to the emergency room today with complaints of shortness of breath.  Patient reports that she has been feeling short of breath for the last 4 to 5 days with experiencing some pedal edema. Upon evaluating here patient was found to be in A-fib with RVR, chest x-ray that was done showed pulmonary vascular congestion with 2+ pedal edema.  Patient is currently on Cardizem and heparin drip evaluated by cardiology, echocardiogram ordered.  Patient also has been having black stools hence GI has been consulted, EGD planned for 7/10.  EGD revealed esophageal mucosal changes suspicious for Barrett's esophagus, hiatal hernia, gastritis.  She was transition from IV heparin to DOAC.  She was educated on lifelong use of PPI and to avoid ibuprofen/NSAIDs indefinitely.  Discharge Diagnoses:   Principal Problem:   Atrial fibrillation with RVR (HCC) Active Problems:   Acute congestive heart failure (HCC)   Uncontrolled hypertension   Anemia   Acute diastolic heart failure (HCC)   Melena   Hypothyroidism   Hypokalemia    New onset A-fib RVR -Status post Cardizem drip --> Toprol -CHA2DS2-VASc score 4 -Eliquis   Acute diastolic CHF -IV Lasix --> now on hold -Strict I's and O's, daily weight, fluid restriction diet -Improved, now euvolemic   Melena -Suspected GI bleed -Hemoglobin remains  stable -GI consulted -EGD 7/10 revealed esophageal mucosal changes suspicious for Barrett's esophagus, hiatal hernia, gastritis -PPI   Hypertension -Toprol   Hypothyroidism -Repeat TSH, free T4 as outpatient in 4 to 6 weeks -Synthroid   Hypokalemia -Replaced  Discharge Instructions  Discharge Instructions     (HEART FAILURE PATIENTS) Call MD:  Anytime you have any of the following symptoms: 1) 3 pound weight gain in 24 hours or 5 pounds in 1 week 2) shortness of breath, with or without a dry hacking cough 3) swelling in the hands, feet or stomach 4) if you have to sleep on extra pillows at night in order to breathe.   Complete by: As directed    Call MD for:  difficulty breathing, headache or visual disturbances   Complete by: As directed    Call MD for:  extreme fatigue   Complete by: As directed    Call MD for:  persistant dizziness or light-headedness   Complete by: As directed    Call MD for:  persistant nausea and vomiting   Complete by: As directed    Call MD for:  severe uncontrolled pain   Complete by: As directed    Call MD for:  temperature >100.4   Complete by: As directed    Diet - low sodium heart healthy   Complete by: As directed    Discharge instructions   Complete by: As directed    You were cared for by a hospitalist during your hospital stay. If you have any questions about your discharge medications or the care you received while you were in the hospital after you are discharged, you can call the unit  and ask to speak with the hospitalist on call if the hospitalist that took care of you is not available. Once you are discharged, your primary care physician will handle any further medical issues. Please note that NO REFILLS for any discharge medications will be authorized once you are discharged, as it is imperative that you return to your primary care physician (or establish a relationship with a primary care physician if you do not have one) for your  aftercare needs so that they can reassess your need for medications and monitor your lab values.   Increase activity slowly   Complete by: As directed       Allergies as of 09/22/2022       Reactions   Hydroxychloroquine Other (See Comments)   Toxicity caused "bulls eye vision"   Reglan [metoclopramide] Other (See Comments)   nightmares   Amlodipine-valsartan-hctz Rash   Celebrex [celecoxib] Rash   Total body rash        Medication List     STOP taking these medications    aspirin EC 81 MG tablet       TAKE these medications    acetaminophen 650 MG CR tablet Commonly known as: TYLENOL Take 1,300 mg by mouth in the morning, at noon, and at bedtime.   apixaban 2.5 MG Tabs tablet Commonly known as: ELIQUIS Take 1 tablet (2.5 mg total) by mouth 2 (two) times daily.   atorvastatin 40 MG tablet Commonly known as: LIPITOR Take 40 mg by mouth daily with supper.   Calcium Citrate-Vitamin D 315-5 MG-MCG Tabs Take 1 tablet by mouth daily.   folic acid 1 MG tablet Commonly known as: FOLVITE Take 1 mg by mouth daily.   gabapentin 100 MG capsule Commonly known as: NEURONTIN Take 100-300 mg by mouth at bedtime. For restless leg   levothyroxine 50 MCG tablet Commonly known as: SYNTHROID Take 50 mcg by mouth daily before breakfast. Due to increase to after completing   metoprolol succinate 50 MG 24 hr tablet Commonly known as: TOPROL-XL Take 1 tablet (50 mg total) by mouth daily. Take with or immediately following a meal. Start taking on: September 23, 2022 What changed:  medication strength how much to take additional instructions   pantoprazole 40 MG tablet Commonly known as: PROTONIX Take 1 tablet (40 mg total) by mouth daily.   PreserVision AREDS Caps Take 1 capsule by mouth in the morning and at bedtime.   Refresh Plus 0.5 % Soln Generic drug: Carboxymethylcellulose Sod PF Place 1 drop into both eyes 6 (six) times daily.   Tymlos 3120  MCG/1.56ML Sopn Generic drug: Abaloparatide Inject 80 mcg into the skin daily.   UNABLE TO FIND Take 750 mg by mouth daily. Bone Restore - Life Extension: take 3 tablets daily   Vitamin D (Ergocalciferol) 1.25 MG (50000 UNIT) Caps capsule Commonly known as: DRISDOL Take 50,000 Units by mouth every 7 (seven) days. Sunday        Follow-up Information     Cleaver, Thomasene Ripple, NP Follow up.   Specialty: Cardiology Why: Thursday Oct 20, 2022 Arrive by 8:10 AM Appt at 8:25 AM (25 min) Contact information: [address] [place] [address] [phone]         Juanita Laster, MD Follow up.   Specialty: Family Medicine Contact information: [address] [place] [address] [phone]         Jenel Lucks, MD Follow up.   Specialty:  Gastroenterology Why: As needed, If symptoms worsen Contact information: [address] [place] [address] [phone]                Allergies  Allergen Reactions   Hydroxychloroquine Other (See Comments)    Toxicity caused "bulls eye vision"   Reglan [Metoclopramide] Other (See Comments)    nightmares   Amlodipine-Valsartan-Hctz Rash   Celebrex [Celecoxib] Rash    Total body rash    Consultations: GI Cardiology   Procedures/Studies: DG Abd 1 View  Result Date: 09/21/2022 CLINICAL DATA:  Abdominal pain and possible constipation EXAM: ABDOMEN - 1 VIEW COMPARISON:  08/09/2018 FINDINGS: Scattered large and small bowel gas is noted. Mild fecal material is noted within the colon improved when compared with the prior exam. No free air or obstructive changes are seen. Stable scoliosis of the lumbar spine is noted. IMPRESSION: Significantly reduced stool burden when compared with the prior exam. No obstructive changes are noted. Electronically Signed   By: Alcide Clever M.D.   On: 09/21/2022 11:42   ECHOCARDIOGRAM COMPLETE  Result Date: 09/20/2022    ECHOCARDIOGRAM REPORT   Patient  Name:   ELLYSON RARICK Date of Exam: 09/20/2022 Medical Rec #:  [id number]         Height:       63.0 in Accession #:    [id number]        Weight:       121.5 lb Date of Birth:  Feb 24, 1941        BSA:          1.564 m Patient Age:    81 years          BP:           135/86 mmHg Patient Gender: F                 HR:           96 bpm. 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 CHF, Stroke, Arrythmias:Atrial Fibrillation,                 Signs/Symptoms:Edema and Shortness of Breath; Risk                 Factors:Hypertension and Non-Smoker.  Sonographer:    Jake Seats RDMS, RVT, RDCS Referring Phys: [id number] [facility] [place]  Sonographer Comments: Global longitudinal strain was attempted. IMPRESSIONS  1. Left ventricular ejection fraction, by estimation, is 65 to 70%. The left ventricle has normal function. The left ventricle has no regional wall motion abnormalities. There is mild concentric left ventricular hypertrophy. Left ventricular diastolic function could not be evaluated.  2. Right ventricular systolic function is normal. The right ventricular size is normal. Tricuspid regurgitation signal is inadequate for assessing PA pressure.  3. Left atrial size was mildly dilated.  4. Moderate pleural effusion in the left lateral region.  5. The mitral valve is normal in structure. Mild mitral valve regurgitation.  6. The aortic valve is tricuspid. Aortic valve regurgitation is not visualized. No aortic stenosis is present.  7. The inferior vena cava is  normal in size with greater than 50% respiratory variability, suggesting right atrial pressure of 3 mmHg. Comparison(s): Echocardiogram done at [facility] on 04/19/21 showed an EF of 60-65%. FINDINGS  Left Ventricle: Apex  was suboptimally seen, otherwise normal LV wall motion. Left ventricular ejection fraction, by estimation, is 65 to 70%. The left ventricle has normal function. The left ventricle has no regional wall motion abnormalities. Global longitudinal strain performed but not reported based on interpreter judgement due to suboptimal tracking. The left ventricular internal cavity size was normal in size. There is mild concentric left ventricular hypertrophy. Left ventricular diastolic function could not be evaluated due to atrial fibrillation. Left ventricular diastolic function could not be evaluated. Right Ventricle: The right ventricular size is normal. No increase in right ventricular wall thickness. Right ventricular systolic function is normal. Tricuspid regurgitation signal is inadequate for assessing PA pressure. Left Atrium: Left atrial size was mildly dilated. Right Atrium: Right atrial size was normal in size. Pericardium: Trivial pericardial effusion is present. Mitral Valve: The mitral valve is normal in structure. Mild mitral valve regurgitation, with posteriorly-directed jet. Tricuspid Valve: The tricuspid valve is normal in structure. Tricuspid valve regurgitation is not demonstrated. Aortic Valve: The aortic valve is tricuspid. Aortic valve regurgitation is not visualized. No aortic stenosis is present. Aortic valve mean gradient measures 2.7 mmHg. Aortic valve peak gradient measures 4.3 mmHg. Aortic valve area, by VTI measures 2.04 cm. Pulmonic Valve: The pulmonic valve was grossly normal. Pulmonic valve regurgitation is trivial. No evidence of pulmonic stenosis. Aorta: The aortic root and ascending aorta are structurally normal, with no evidence of dilitation. Venous: The inferior vena cava is normal in size with greater than 50% respiratory variability, suggesting right atrial pressure of 3 mmHg. IAS/Shunts: No atrial level shunt detected by color flow Doppler. Additional Comments: There is a moderate  pleural effusion in the left lateral region.  LEFT VENTRICLE PLAX 2D LVIDd:         4.00 cm     Diastology LVIDs:         2.80 cm     LV e' medial:    5.62 cm/s LV PW:         1.30 cm     LV E/e' medial:  18.5 LV IVS:        1.40 cm     LV e' lateral:   6.96 cm/s LVOT diam:     1.70 cm     LV E/e' lateral: 14.9 LV SV:         39 LV SV Index:   25 LVOT Area:     2.27 cm  LV Volumes (MOD) LV vol d, MOD A2C: 33.7 ml LV vol d, MOD A4C: 28.9 ml LV vol s, MOD A2C: 13.4 ml LV vol s, MOD A4C: 12.1 ml LV SV MOD A2C:     20.3 ml LV SV MOD A4C:     28.9 ml LV SV MOD BP:      18.1 ml RIGHT VENTRICLE RV S prime:     10.23 cm/s TAPSE (M-mode): 1.7 cm LEFT ATRIUM             Index        RIGHT ATRIUM           Index LA diam:        2.90 cm 1.85 cm/m   RA Area:     16.00 cm LA Vol (A2C):   34.5 ml 22.05 ml/m  RA Volume:  42.90 ml  27.42 ml/m LA Vol (A4C):   62.8 ml 40.14 ml/m LA Biplane Vol: 48.8 ml 31.19 ml/m  AORTIC VALVE AV Area (Vmax):    1.87 cm AV Area (Vmean):   1.77 cm AV Area (VTI):     2.04 cm AV Vmax:           103.80 cm/s AV Vmean:          78.600 cm/s AV VTI:            0.193 m AV Peak Grad:      4.3 mmHg AV Mean Grad:      2.7 mmHg LVOT Vmax:         85.30 cm/s LVOT Vmean:        61.150 cm/s LVOT VTI:          0.174 m LVOT/AV VTI ratio: 0.90  AORTA Ao Root diam: 2.80 cm Ao Asc diam:  2.90 cm MITRAL VALVE MV Area (PHT): 4.36 cm     SHUNTS MV Decel Time: 174 msec     Systemic VTI:  0.17 m MR Peak grad: 15.2 mmHg     Systemic Diam: 1.70 cm MR Vmax:      195.00 cm/s MV E velocity: 104.00 cm/s MV A velocity: 23.30 cm/s MV E/A ratio:  4.46 Mihai Croitoru MD Electronically signed by Thurmon Fair MD Signature Date/Time: 09/20/2022/10:29:01 AM    Final    CT Angio Chest PE W and/or Wo Contrast  Result Date: 09/19/2022 CLINICAL DATA:  Pulmonary embolism (PE) suspected, high prob Shortness of breath. EXAM: CT ANGIOGRAPHY CHEST WITH CONTRAST TECHNIQUE: Multidetector CT imaging of the chest was performed using the  standard protocol during bolus administration of intravenous contrast. Multiplanar CT image reconstructions and MIPs were obtained to evaluate the vascular anatomy. RADIATION DOSE REDUCTION: This exam was performed according to the departmental dose-optimization program which includes automated exposure control, adjustment of the mA and/or kV according to patient size and/or use of iterative reconstruction technique. CONTRAST:  75mL OMNIPAQUE IOHEXOL 350 MG/ML SOLN COMPARISON:  Radiograph earlier today FINDINGS: Cardiovascular: There are no filling defects within the pulmonary arteries to suggest pulmonary embolus. Atherosclerosis of the thoracic aorta with tortuosity. Multi chamber cardiomegaly. Trace pericardial effusion. There are coronary artery calcifications. Mediastinum/Nodes: Patulous esophagus without wall thickening. No mediastinal or hilar adenopathy. No thyroid nodule. Lungs/Pleura: Moderate bilateral pleural effusions with associated compressive atelectasis. Occasional areas of septal thickening and ground-glass opacities suspicious for pulmonary edema. There is biapical pleuroparenchymal scarring. Trachea and central airways are clear. Upper Abdomen: No acute findings. Musculoskeletal: T11 superior endplate compression deformity appears subacute. Mild T7 superior endplate compression deformity which is age indeterminate. Review of the MIP images confirms the above findings. IMPRESSION: 1. No pulmonary embolus. 2. Congestive heart failure. Multi chamber cardiomegaly with moderate bilateral pleural effusions and pulmonary edema. 3. T11 superior endplate compression deformity appears subacute. Mild T7 superior endplate compression deformity which is age indeterminate. Aortic Atherosclerosis (ICD10-I70.0). Electronically Signed   By: Narda Rutherford M.D.   On: 09/19/2022 16:09   DG Chest Portable 1 View  Result Date: 09/19/2022 CLINICAL DATA:  Shortness of breath. EXAM: PORTABLE CHEST 1 VIEW  COMPARISON:  None Available. FINDINGS: Mild cardiomegaly and pulmonary vascular congestion. Small bilateral pleural effusions with bibasilar opacities. No pneumothorax. No acute osseous abnormality. IMPRESSION: 1. Mild congestive heart failure. Electronically Signed   By: Obie Dredge M.D.   On: 09/19/2022 13:45       Discharge Exam: Vitals:  09/22/22 0810 09/22/22 1124  BP: 127/85 131/83  Pulse: (!) 58 96  Resp: 19 18  Temp: 98.2 F (36.8 C) 97.9 F (36.6 C)  SpO2: 96% 100%    General: Pt is alert, awake, not in acute distress Cardiovascular: Irregular rhythm, S1/S2 +, no edema Respiratory: CTA bilaterally, no wheezing, no rhonchi, no respiratory distress, no conversational dyspnea  Abdominal: Soft, NT, ND, bowel sounds + Extremities: no edema, no cyanosis Psych: Normal mood and affect, stable judgement and insight     The results of significant diagnostics from this hospitalization (including imaging, microbiology, ancillary and laboratory) are listed below for reference.     Microbiology: Recent Results (from the past 240 hour(s))  SARS Coronavirus 2 by RT PCR (hospital order, performed in [facility] hospital lab) *cepheid single result test* Anterior Nasal Swab     Status: None   Collection Time: 09/19/22  3:42 PM   Specimen: Anterior Nasal Swab  Result Value Ref Range Status   SARS Coronavirus 2 by RT PCR NEGATIVE NEGATIVE Final    Comment: Performed at [facility], [address] [place], [place] [address]     Labs: BNP (last 3 results) Recent Labs    09/19/22 1247  BNP 677.8*   Basic Metabolic Panel: Recent Labs  Lab 09/19/22 1252 09/20/22 0049 09/20/22 2359 09/22/22 0249  NA 127*  --  128* 131*  K 4.3  --  3.4* 4.2  CL 97*  --  91* 100  CO2 19*  --  22 22  GLUCOSE 93  --  128* 95  BUN 18  --  28* 14  CREATININE 0.95 0.85 1.24* 1.01*  CALCIUM 8.7*  --  8.3* 8.9  MG  --   --  1.8 2.0   Liver Function Tests: Recent Labs  Lab  09/19/22 1252  AST 45*  ALT 34  ALKPHOS 139*  BILITOT 1.4*  PROT 6.6  ALBUMIN 3.4*   No results for input(s): "LIPASE", "AMYLASE" in the last 168 hours. Recent Labs  Lab 09/19/22 1257  AMMONIA <10   CBC: Recent Labs  Lab 09/19/22 1252 09/20/22 0049 09/20/22 2359 09/22/22 0249  WBC 5.5 9.9 6.7 8.6  NEUTROABS 4.1  --   --   --   HGB 10.3* 11.4* 10.9* 11.6*  HCT 31.2* 34.1* 32.1* 35.5*  MCV 92.9 92.2 89.7 93.4  PLT 224 243 249 257   Cardiac Enzymes: No results for input(s): "CKTOTAL", "CKMB", "CKMBINDEX", "TROPONINI" in the last 168 hours. BNP: Invalid input(s): "POCBNP" CBG: No results for input(s): "GLUCAP" in the last 168 hours. D-Dimer Recent Labs    09/20/22 0427  DDIMER 0.58*   Hgb A1c No results for input(s): "HGBA1C" in the last 72 hours. Lipid Profile No results for input(s): "CHOL", "HDL", "LDLCALC", "TRIG", "CHOLHDL", "LDLDIRECT" in the last 72 hours. Thyroid function studies Recent Labs    09/20/22 0427  TSH 5.772*   Anemia work up No results for input(s): "VITAMINB12", "FOLATE", "FERRITIN", "TIBC", "IRON", "RETICCTPCT" in the last 72 hours. Urinalysis No results found for: "COLORURINE", "APPEARANCEUR", "LABSPEC", "PHURINE", "GLUCOSEU", "HGBUR", "BILIRUBINUR", "KETONESUR", "PROTEINUR", "UROBILINOGEN", "NITRITE", "LEUKOCYTESUR" Sepsis Labs Recent Labs  Lab 09/19/22 1252 09/20/22 0049 09/20/22 2359 09/22/22 0249  WBC 5.5 9.9 6.7 8.6   Microbiology Recent Results (from the past 240 hour(s))  SARS Coronavirus 2 by RT PCR (hospital order, performed in [facility] hospital lab) *cepheid single result test* Anterior Nasal Swab     Status: None   Collection Time: 09/19/22  3:42 PM   Specimen: Anterior Nasal Swab  Result Value Ref Range Status   SARS Coronavirus 2 by RT PCR NEGATIVE NEGATIVE Final    Comment: Performed at [facility], [address], [place], [place] [address]     Patient was seen and examined on the day of discharge  and was found to be in stable condition. Time coordinating discharge: 35 minutes including assessment and coordination of care, as well as examination of the patient.   SIGNED:  Noralee Stain, DO [facility] 09/22/2022, 12:05 PM

## 2022-09-22 NOTE — Anesthesia Postprocedure Evaluation (Signed)
Anesthesia Post Note  Patient: Sara Snow  Procedure(s) Performed: ESOPHAGOGASTRODUODENOSCOPY (EGD) WITH PROPOFOL     Patient location during evaluation: PACU Anesthesia Type: MAC Level of consciousness: awake and alert Pain management: pain level controlled Vital Signs Assessment: post-procedure vital signs reviewed and stable Respiratory status: spontaneous breathing Cardiovascular status: stable Anesthetic complications: no   No notable events documented.  Last Vitals:  Vitals:   09/22/22 0300 09/22/22 0400  BP:    Pulse:    Resp: 19 20  Temp:    SpO2:      Last Pain:  Vitals:   09/22/22 0238  TempSrc: Oral  PainSc:                  Lewie Loron

## 2022-09-22 NOTE — Progress Notes (Signed)
TRH night cross cover note:   I was contacted by RN requesting clarificaiton regarding existing order for Gabapentin, which is currently ordered as Gabapentin 100 - 300 mg po at bedtime. Patient conveys that, as an outpatient, she takes Gabapentin 200 mg PO at bedtime prn for restless leg.   As the patient had already received a dose of gabapentin 100 mg p.o. earlier this evening, I subsequently placed an order for a one-time additional dose of 100 mg of gabapentin.  Additionally, I have updated existing order for gabapentin to reflect 200 mg p.o. nightly as needed for restless legs.    Newton Pigg, DO Hospitalist

## 2022-09-22 NOTE — Progress Notes (Signed)
DAILY PROGRESS NOTE   Patient Name: Sara Snow Date of Encounter: 09/22/2022 Cardiologist: None  Chief Complaint   No complaints  Patient Profile   82 yo female with progressive DOE, orthopnea, weight gain and fatigue, presented to PCP and found to be in afib with RVR   Subjective   EGD yesterday showed some gastritis, no high risk bleeding lesions, probable Barrett's esophagus, 4 cm hiatal hernia. Remains in afib - rates are a little fast. BP improved.   Objective   Vitals:   09/22/22 0238 09/22/22 0300 09/22/22 0400 09/22/22 0810  BP: (!) 141/100   127/85  Pulse: (!) 106   (!) 58  Resp: 19 19 20 19   Temp: 97.8 F (36.6 C)   98.2 F (36.8 C)  TempSrc: Oral   Oral  SpO2:    96%  Weight:  55.5 kg    Height:        Intake/Output Summary (Last 24 hours) at 09/22/2022 1032 Last data filed at 09/21/2022 2151 Gross per 24 hour  Intake 200 ml  Output 300 ml  Net -100 ml   Filed Weights   09/21/22 0445 09/21/22 0608 09/22/22 0300  Weight: 55.1 kg 54.8 kg 55.5 kg    Physical Exam   General appearance: alert and no distress Neck: no carotid bruit, no JVD, and thyroid not enlarged, symmetric, no tenderness/mass/nodules Lungs: clear to auscultation bilaterally Heart: irregularly irregular rhythm Abdomen: soft, non-tender; bowel sounds normal; no masses,  no organomegaly Extremities: extremities normal, atraumatic, no cyanosis or edema Pulses: 2+ and symmetric Skin: Skin color, texture, turgor normal. No rashes or lesions Neurologic: Grossly normal Psych: Pleasant  Inpatient Medications    Scheduled Meds:  atorvastatin  40 mg Oral Q supper   folic acid  1 mg Oral Daily   levothyroxine  50 mcg Oral QAC breakfast   metoprolol succinate  25 mg Oral Daily   pantoprazole  40 mg Oral Daily   polyvinyl alcohol  1 drop Both Eyes 6 X Daily    Continuous Infusions:  heparin 900 Units/hr (09/21/22 1943)   lactated ringers 10 mL/hr at 09/21/22 1449    PRN  Meds: gabapentin, oxyCODONE-acetaminophen   Labs   Results for orders placed or performed during the hospital encounter of 09/19/22 (from the past 48 hour(s))  Heparin level (unfractionated)     Status: None   Collection Time: 09/20/22 12:44 PM  Result Value Ref Range   Heparin Unfractionated 0.56 0.30 - 0.70 IU/mL    Comment: (NOTE) The clinical reportable range upper limit is being lowered to >1.10 to align with the FDA approved guidance for the current laboratory assay.  If heparin results are below expected values, and patient dosage has  been confirmed, suggest follow up testing of antithrombin III levels. Performed at [facility], [address] [place], [place] [address]   Heparin level (unfractionated)     Status: None   Collection Time: 09/20/22 11:59 PM  Result Value Ref Range   Heparin Unfractionated 0.53 0.30 - 0.70 IU/mL    Comment: (NOTE) The clinical reportable range upper limit is being lowered to >1.10 to align with the FDA approved guidance for the current laboratory assay.  If heparin results are below expected values, and patient dosage has  been confirmed, suggest follow up testing of antithrombin III levels. Performed at [facility], [address], [place], [place] [address]   CBC     Status: Abnormal   Collection  Time: 09/20/22 11:59 PM  Result Value Ref Range   WBC 6.7 4.0 - 10.5 K/uL   RBC 3.58 (L) 3.87 - 5.11 MIL/uL   Hemoglobin 10.9 (L) 12.0 - 15.0 g/dL   HCT 40.9 (L) 81.1 - 91.4 %   MCV 89.7 80.0 - 100.0 fL   MCH 30.4 26.0 - 34.0 pg   MCHC 34.0 30.0 - 36.0 g/dL   RDW 78.2 95.6 - 21.3 %   Platelets 249 150 - 400 K/uL   nRBC 0.0 0.0 - 0.2 %    Comment: Performed at [facility], [address], [place], [place] [address]  Basic metabolic panel     Status: Abnormal   Collection Time: 09/20/22 11:59 PM  Result Value Ref Range   Sodium 128 (L) 135 - 145 mmol/L   Potassium 3.4 (L) 3.5 - 5.1 mmol/L   Chloride 91 (L) 98 -  111 mmol/L   CO2 22 22 - 32 mmol/L   Glucose, Bld 128 (H) 70 - 99 mg/dL    Comment: Glucose reference range applies only to samples taken after fasting for at least 8 hours.   BUN 28 (H) 8 - 23 mg/dL   Creatinine, Ser 8.46 (H) 0.44 - 1.00 mg/dL   Calcium 8.3 (L) 8.9 - 10.3 mg/dL   GFR, Estimated 44 (L) >60 mL/min    Comment: (NOTE) Calculated using the CKD-EPI Creatinine Equation (2021)    Anion gap 15 5 - 15    Comment: Performed at [facility], [address], [place], [place] [address]  Magnesium     Status: None   Collection Time: 09/20/22 11:59 PM  Result Value Ref Range   Magnesium 1.8 1.7 - 2.4 mg/dL    Comment: Performed at [facility], [address], [place], [place] [address]  CBC     Status: Abnormal   Collection Time: 09/22/22  2:49 AM  Result Value Ref Range   WBC 8.6 4.0 - 10.5 K/uL   RBC 3.80 (L) 3.87 - 5.11 MIL/uL   Hemoglobin 11.6 (L) 12.0 - 15.0 g/dL   HCT 24.4 (L) 01.0 - 27.2 %   MCV 93.4 80.0 - 100.0 fL   MCH 30.5 26.0 - 34.0 pg   MCHC 32.7 30.0 - 36.0 g/dL   RDW 53.6 64.4 - 03.4 %   Platelets 257 150 - 400 K/uL   nRBC 0.0 0.0 - 0.2 %    Comment: Performed at [facility], [address] [place], [place] [address]  Basic metabolic panel     Status: Abnormal   Collection Time: 09/22/22  2:49 AM  Result Value Ref Range   Sodium 131 (L) 135 - 145 mmol/L   Potassium 4.2 3.5 - 5.1 mmol/L   Chloride 100 98 - 111 mmol/L   CO2 22 22 - 32 mmol/L   Glucose, Bld 95 70 - 99 mg/dL    Comment: Glucose reference range applies only to samples taken after fasting for at least 8 hours.   BUN 14 8 - 23 mg/dL   Creatinine, Ser 5.63 (H) 0.44 - 1.00 mg/dL   Calcium 8.9 8.9 - 87.5 mg/dL   GFR, Estimated 56 (L) >60 mL/min    Comment: (NOTE) Calculated using the CKD-EPI Creatinine Equation (2021)    Anion gap 9 5 - 15    Comment: Performed at [facility], [address], [place], [place] [address]  Magnesium     Status: None  Collection  Time: 09/22/22  2:49 AM  Result Value Ref Range   Magnesium 2.0 1.7 - 2.4 mg/dL    Comment: Performed at [facility], [address] [place], [place] [address]  Heparin level (unfractionated)     Status: None   Collection Time: 09/22/22  2:49 AM  Result Value Ref Range   Heparin Unfractionated 0.47 0.30 - 0.70 IU/mL    Comment: (NOTE) The clinical reportable range upper limit is being lowered to >1.10 to align with the FDA approved guidance for the current laboratory assay.  If heparin results are below expected values, and patient dosage has  been confirmed, suggest follow up testing of antithrombin III levels. Performed at [facility], [address] [place], [place] [address]     ECG   AFib at 134 - Personally Reviewed  Telemetry   Afib with CVR - Personally Reviewed  Radiology    DG Abd 1 View  Result Date: 09/21/2022 CLINICAL DATA:  Abdominal pain and possible constipation EXAM: ABDOMEN - 1 VIEW COMPARISON:  08/09/2018 FINDINGS: Scattered large and small bowel gas is noted. Mild fecal material is noted within the colon improved when compared with the prior exam. No free air or obstructive changes are seen. Stable scoliosis of the lumbar spine is noted. IMPRESSION: Significantly reduced stool burden when compared with the prior exam. No obstructive changes are noted. Electronically Signed   By: Alcide Clever M.D.   On: 09/21/2022 11:42    Cardiac Studies   See echo above  Assessment   Principal Problem:   Atrial fibrillation with RVR (HCC) Active Problems:   Acute congestive heart failure (HCC)   Uncontrolled hypertension   Anemia   Acute diastolic heart failure (HCC)   Melena   Hypothyroidism   Hypokalemia   Plan   A-fib with rapid ventricular response HR better controlled, but hypotensive today- stop diltiazem. On home metoprolol. Discussed management options, but in light of normal LV function on echo, no urgent need for cardioversion.  Would prefer to see if she tolerates anticoagulation before committing her to it (such as if she had cardioversion, due to the increased stroke risk of the procedure).  Switch to Eliquis 2.5 mg BID (Age 11, weight <60 kg), stop heparin Increase Toprol XL to 50 mg daily. BP improved Will plan 3-4 weeks of anticoagulation and follow-up in the office to consider outpatient elective DCCV if she remains in afib. Uncontrolled hypertension BP has normalized - room to increase Toprol XL to 50 mg daily. Anemia No clear UGI source of anemia. Some gastritis, now on protonix.  Probably could be discharged later today from a cardiology standpoint. Will arrange for follow-up.  [facility] will sign off.   Medication Recommendations:  as above Other recommendations (labs, testing, etc):  none Follow up as an outpatient:  Dr. Rennis Golden or APP in 4-6 weeks.   Time Spent Directly with Patient:  I have spent a total of 25 minutes with the patient reviewing hospital notes, telemetry, EKGs, labs and examining the patient as well as establishing an assessment and plan that was discussed personally with the patient.  > 50% of time was spent in direct patient care.  Length of Stay:  LOS: 3 days   Chrystie Nose, MD, [facility], FACP  [place]  [facility]  Medical Director of the [facility]  Cardiovascular Risk Reduction Clinic Diplomate of the American Board of Clinical Lipidology Attending Cardiologist  Direct  Dial: [phone]  Fax: [phone]  Website:  [email or web address] Nicely Briston Lax 09/22/2022, 10:32 AM

## 2022-09-22 NOTE — Discharge Instructions (Signed)

## 2022-09-22 NOTE — Evaluation (Signed)
Physical Therapy Evaluation Patient Details Name: Sara Snow MRN: [id number] DOB: [birth year] Today's Date: 09/22/2022  History of Present Illness  Pt is 82 year old presented to [facility] on  09/19/22 for afib with rvr and acute heart failure. Pt also developed melena with suspected GI bleed. PMH - htn, CVA  Clinical Impression  Pt doing well with mobility and no further PT needed.  Ready for dc from PT standpoint.         Assistance Recommended at Discharge Intermittent Supervision/Assistance  If plan is discharge home, recommend the following:  Can travel by private vehicle  Assistance with cooking/housework;Assist for transportation        Equipment Recommendations None recommended by PT  Recommendations for Other Services       Functional Status Assessment       Precautions / Restrictions        Mobility  Bed Mobility               General bed mobility comments: Pt up in chair    Transfers Overall transfer level: Modified independent Equipment used: Rollator (4 wheels)                    Ambulation/Gait Ambulation/Gait assistance: Supervision Gait Distance (Feet): 150 Feet Assistive device: Rollator (4 wheels) Gait Pattern/deviations: Step-through pattern, Decreased stride length Gait velocity: decr Gait velocity interpretation: 1.31 - 2.62 ft/sec, indicative of limited community ambulator   General Gait Details: Steady gait with rollator  Stairs            Wheelchair Mobility     Tilt Bed    Modified Rankin (Stroke Patients Only)       Balance Overall balance assessment: Needs assistance Sitting-balance support: No upper extremity supported, Feet supported Sitting balance-Leahy Scale: Good     Standing balance support: No upper extremity supported, During functional activity Standing balance-Leahy Scale: Fair                               Pertinent Vitals/Pain Pain Assessment Pain Assessment: No/denies  pain    Home Living Family/patient expects to be discharged to:: Other (Comment) (Independent living at [facility])                        Prior Function Prior Level of Function : Independent/Modified Independent             Mobility Comments: uses rollator outside of home       Hand Dominance        Extremity/Trunk Assessment   Upper Extremity Assessment Upper Extremity Assessment: Overall WFL for tasks assessed    Lower Extremity Assessment Lower Extremity Assessment: Generalized weakness       Communication   Communication: No difficulties  Cognition Arousal/Alertness: Awake/alert Behavior During Therapy: WFL for tasks assessed/performed Overall Cognitive Status: Within Functional Limits for tasks assessed                                          General Comments      Exercises     Assessment/Plan    PT Assessment Patient does not need any further PT services  PT Problem List         PT Treatment Interventions      PT Goals (Current goals can be found  in the Care Plan section)  Acute Rehab PT Goals PT Goal Formulation: All assessment and education complete, DC therapy    Frequency       Co-evaluation               AM-PAC PT "6 Clicks" Mobility  Outcome Measure Help needed turning from your back to your side while in a flat bed without using bedrails?: None Help needed moving from lying on your back to sitting on the side of a flat bed without using bedrails?: None Help needed moving to and from a bed to a chair (including a wheelchair)?: None Help needed standing up from a chair using your arms (e.g., wheelchair or bedside chair)?: None Help needed to walk in hospital room?: A Little Help needed climbing 3-5 steps with a railing? : A Little 6 Click Score: 22    End of Session   Activity Tolerance: Patient tolerated treatment well Patient left: in chair;with call bell/phone within reach;with family/visitor  present Nurse Communication: Mobility status PT Visit Diagnosis: Muscle weakness (generalized) (M62.81)    Time: 1610-9604 PT Time Calculation (min) (ACUTE ONLY): 9 min   Charges:   PT Evaluation $PT Eval Low Complexity: 1 Low   PT General Charges $$ ACUTE PT VISIT: 1 Visit         [facility] PT Acute Rehabilitation Services Office [phone]   Angelina Ok [facility] 09/22/2022, 3:45 PM

## 2022-09-22 NOTE — TOC CM/SW Note (Signed)
Transition of Care [facility]) - Inpatient Brief Assessment   Patient Details  Name: Sara Snow MRN: [id number] Date of Birth: [birth year]  [facility] CM/SW Contact:    Darrold Span, RN Phone Number: 09/22/2022, 3:28 PM   Clinical Narrative: Pt stable for transition home today- pt lives at [facility]. PT has completed eval and no recommendations made for f/u.  Family to transport home   Transition of Care Asessment: Insurance and Status: Insurance coverage has been reviewed Patient has primary care physician: Yes Home environment has been reviewed: [place] at [employer] Prior level of function:: Self Prior/Current Home Services: No current home services Social Determinants of Health Reivew: SDOH reviewed no interventions necessary Readmission risk has been reviewed: Yes Transition of care needs: no transition of care needs at this time

## 2022-09-22 NOTE — Care Management Important Message (Signed)
Important Message  Patient Details  Name: Sara Snow MRN: [id number] Date of Birth: 1941/02/12   Medicare Important Message Given:  Yes     Renie Ora 09/22/2022, 8:52 AM

## 2022-09-23 LAB — SURGICAL PATHOLOGY

## 2022-09-27 ENCOUNTER — Telehealth: Payer: Self-pay | Admitting: *Deleted

## 2022-09-27 NOTE — Progress Notes (Signed)
Sara Snow,     1 The biopsies that I took during your recent procedure showed Barrett's mucosa (intestinal metaplasia), but NO sign of the pre-cancerous change called "dysplasia."    Barrett's esophagus is a change in the lining of the esophagus associated with long term acid reflux and is associated with an increased risk of esophageal cancer.  The risk of esophageal cancer is still very low, but it is recommended that patient's with Barrett's esophagus undergo 'surveillance' endoscopies every 3-5 years. Given your age and comorbidities, as well as your female sex (which is associated with very low risk of esophageal cancer), I would recommend against routine Barrett's surveillance.  Please follow up with me in the office if you would like to discuss further Barrett's surveillance.  It is also recommended that patient's with Barrett's esophagus take a proton pump inhibitor (medication to reduce stomach acid) daily.  The biopsies taken from your stomach were notable for mild reactive gastropathy which is a common finding and often related to use of certain medications (usually NSAIDs), but there was no evidence of Helicobacter pylori infection. This common finding is not felt to necessarily be a cause of any particular symptom and there is no specific treatment or further evaluation recommended.

## 2022-09-28 ENCOUNTER — Telehealth: Payer: Self-pay | Admitting: *Deleted

## 2022-09-28 NOTE — Telephone Encounter (Signed)
Called patient to inform of Dr. Milas Hock  response and stating Barrett's esophagus can regress with time, although most patient's do not experience this. Patient understands.

## 2022-09-28 NOTE — Telephone Encounter (Signed)
-----   Message from Jenel Lucks sent at 09/28/2022  4:23 PM EDT ----- Yes, Barrett's esophagus can regress with time, although most patients do not experience this. ----- Message ----- From: Avanell Shackleton, RN Sent: 09/28/2022   1:13 PM EDT To: Jenel Lucks, MD  Called patient to inform of Dr. Milas Hock result and recommendations. Patient has a question concerning whether or not Barretts esophagus can be reversed over time. Patient understood and agreed to recommendations.

## 2022-10-18 ENCOUNTER — Telehealth: Payer: Self-pay | Admitting: Internal Medicine

## 2022-10-18 NOTE — Progress Notes (Unsigned)
Office Visit    Patient Name: Sara Snow Date of Encounter: 10/19/2022  Primary Care Provider:  Juanita Laster, MD Primary Cardiologist:  None Primary Electrophysiologist: None   Past Medical History    Past Medical History:  Diagnosis Date   GERD (gastroesophageal reflux disease)    Hypertension    RA (rheumatoid arthritis) (HCC)    Restless leg syndrome    Stroke [facility])    SVT (supraventricular tachycardia)    Thyroid disease    Past Surgical History:  Procedure Laterality Date   BIOPSY  09/21/2022   Procedure: BIOPSY;  Surgeon: Jenel Lucks, MD;  Location: [facility] ENDOSCOPY;  Service: Gastroenterology;;   ESOPHAGOGASTRODUODENOSCOPY (EGD) WITH PROPOFOL N/A 09/21/2022   Procedure: ESOPHAGOGASTRODUODENOSCOPY (EGD) WITH PROPOFOL;  Surgeon: Jenel Lucks, MD;  Location: [facility] ENDOSCOPY;  Service: Gastroenterology;  Laterality: N/A;    Allergies  Allergies  Allergen Reactions   Hydroxychloroquine Other (See Comments)    Toxicity caused "bulls eye vision"   Reglan [Metoclopramide] Other (See Comments)    nightmares   Amlodipine-Valsartan-Hctz Rash   Celebrex [Celecoxib] Rash    Total body rash     History of Present Illness    Sara Snow  is a 82 year old female with a PMH of atrial fibrillation, HTN, CVA, hypothyroidism, HFpEF, anemia who presents today for complaint of shortness of breath and weight gain.  Sara Snow was seen initially 09/2018 for when she presented to the ED with complaint of shortness of breath and was found to have new onset AF with RVR.  She was treated with IV Cardizem was placed on Eliquis 5 mg twice daily.  2D echo was completed and showed EF of 65-70% with mild concentric LVH and mildly dilated LA with moderate pleural effusion in the left lateral region, mild mitral regurgitation.  She was discharged and DCCV was deferred with plan to have outpatient procedure after 3 to 4 weeks of anticoagulation.  She contacted our office  on 10/18/2022 with complaint of shortness of breath and weight gain.  Had a heart rate in the 90s to low 100s and reported having to sleep sitting up.  Since last being seen in the office patient reports she is experienced increased shortness of breath and increased lower extremity edema.  She has experienced orthopnea and has been sleeping in a recliner over the past 2 to 3 weeks.  Her blood pressure today is controlled at 148/82 and heart rate was 105.  She reports compliance with her current medications and denies any doses of Eliquis over the past 3 to 4 weeks.  She is slightly volume up on examination today.  She denies any sodium indiscretions and has otherwise been compliant with her medications.  Patient denies chest pain, palpitations, dyspnea, PND, orthopnea, nausea, vomiting, dizziness, syncope, edema, weight gain, or early satiety.   Home Medications    Current Outpatient Medications  Medication Sig Dispense Refill   furosemide (LASIX) 20 MG tablet Take 1 tablet (20 mg total) by mouth 2 (two) times daily for 3 days, THEN 1 tablet (20 mg total) daily for 7 days. 15 tablet 0   metoprolol tartrate (LOPRESSOR) 25 MG tablet Take 1 tablet (25 mg total) by mouth daily as needed (HEART RATE OVER 120). 30 tablet 1   potassium chloride (KLOR-CON) 10 MEQ tablet Take 1 tablet (10 mEq total) by mouth daily. 10 tablet 0   Abaloparatide (TYMLOS) 3120 MCG/1.56ML SOPN Inject 80 mcg into the skin daily. (Patient not  taking: Reported on 09/19/2022)     acetaminophen (TYLENOL) 650 MG CR tablet Take 1,300 mg by mouth in the morning, at noon, and at bedtime.     apixaban (ELIQUIS) 2.5 MG TABS tablet Take 1 tablet (2.5 mg total) by mouth 2 (two) times daily. 60 tablet 1   atorvastatin (LIPITOR) 40 MG tablet Take 40 mg by mouth daily with supper.     Calcium Citrate-Vitamin D 315-5 MG-MCG TABS Take 1 tablet by mouth daily.     Carboxymethylcellulose Sod PF (REFRESH PLUS) 0.5 % SOLN Place 1 drop into both eyes 6  (six) times daily.     folic acid (FOLVITE) 1 MG tablet Take 1 mg by mouth daily.     gabapentin (NEURONTIN) 100 MG capsule Take 100-300 mg by mouth at bedtime. For restless leg     levothyroxine (SYNTHROID) 50 MCG tablet Take 50 mcg by mouth daily before breakfast. Due to increase to after completing     metoprolol succinate (TOPROL-XL) 50 MG 24 hr tablet Take 1 tablet (50 mg total) by mouth daily. Take with or immediately following a meal. 30 tablet 1   Multiple Vitamins-Minerals (PRESERVISION AREDS) CAPS Take 1 capsule by mouth in the morning and at bedtime.     pantoprazole (PROTONIX) 40 MG tablet Take 1 tablet (40 mg total) by mouth daily. 30 tablet 1   UNABLE TO FIND Take 750 mg by mouth daily. Bone Restore - Life Extension: take 3 tablets daily     Vitamin D, Ergocalciferol, (DRISDOL) 1.25 MG (50000 UNIT) CAPS capsule Take 50,000 Units by mouth every 7 (seven) days. Sunday     No current facility-administered medications for this visit.     Review of Systems  Please see the history of present illness.    (+) Shortness of breath (+) Lower extremity edema  All other systems reviewed and are otherwise negative except as noted above.  Physical Exam    Wt Readings from Last 3 Encounters:  10/19/22 128 lb (58.1 kg)  09/22/22 122 lb 4.8 oz (55.5 kg)  07/02/21 122 lb (55.3 kg)   VS: Vitals:   10/19/22 1349  BP: (!) 148/82  Pulse: (!) 105  SpO2: 98%  ,Body mass index is 22.67 kg/m.  Constitutional:      Appearance: Healthy appearance. Not in distress.  Neck:     Vascular: JVD normal.  Pulmonary:     Effort: Pulmonary effort is normal.     Breath sounds: No wheezing. No rales. Diminished in the bases Cardiovascular:     Irregularly irregular normal S1. Normal S2.      Murmurs: There is no murmur.  Edema:    +2 bilateral lower extremity edema Abdominal:     Palpations: Abdomen is soft non tender. There is no hepatomegaly.  Skin:    General: Skin is warm and  dry.  Neurological:     General: No focal deficit present.     Mental Status: Alert and oriented to person, place and time.     Cranial Nerves: Cranial nerves are intact.  EKG/LABS/ Recent Cardiac Studies    ECG personally reviewed by me today -atrial fibrillation with rate of 91 bpm and incomplete RBBB with left axis deviation noted and no acute changes since previous EKG.   Risk Assessment/Calculations:    CHA2DS2-VASc Score = 7   This indicates a 11.2% annual risk of stroke. The patient's score is based upon: CHF History: 1 HTN History: 1 Diabetes History: 0  Stroke History: 2 Vascular Disease History: 0 Age Score: 2 Gender Score: 1           Lab Results  Component Value Date   WBC 8.6 09/22/2022   HGB 11.6 (L) 09/22/2022   HCT 35.5 (L) 09/22/2022   MCV 93.4 09/22/2022   PLT 257 09/22/2022   Lab Results  Component Value Date   CREATININE 1.01 (H) 09/22/2022   BUN 14 09/22/2022   NA 131 (L) 09/22/2022   K 4.2 09/22/2022   CL 100 09/22/2022   CO2 22 09/22/2022   Lab Results  Component Value Date   ALT 34 09/19/2022   AST 45 (H) 09/19/2022   ALKPHOS 139 (H) 09/19/2022   BILITOT 1.4 (H) 09/19/2022   No results found for: "CHOL", "HDL", "LDLCALC", "LDLDIRECT", "TRIG", "CHOLHDL"  No results found for: "HGBA1C"   Assessment & Plan    1.  Paroxysmal AF: -Today patient is in atrial fibrillation per EKG with variable rate of 91 to 105 bpm -She is short of breath and symptomatic and has noticed 5 pound weight gain since being discharged from the hospital. -Will send patient for DCCV -CBC and BMET in 1 week -Continue rate control with Toprol-XL 50 mg daily and as needed 25 mg for heart rate greater than 120 bpm. -CHA2DS2-VASc Score = 7 [CHF History: 1, HTN History: 1, Diabetes History: 0, Stroke History: 2, Vascular Disease History: 0, Age Score: 2, Gender Score: 1].  Therefore, the patient's annual risk of stroke is 11.2 %.      2.  Essential  hypertension: -Patient blood pressure today was 148/82 -Continue Toprol-XL 50 mg daily with as needed 25 mg  3.  HFpEF/shortness of breath: -Patient's 2D echo completed showing EF of 65 to 70% with no RWMA and moderate pleural effusion, mild MVR -Today's volume up on exam and has 5 pound weight gain since her hospital discharge. -Patient will take Lasix 20 mg twice daily x 3 days and then 20 mg daily x 1 week -She will take potassium 10 mEq daily -Patient will then take Lasix as needed 20 mg we will check BMET and CBC in 1 week -Low sodium diet, fluid restriction <2L, and daily weights encouraged. Educated to contact our office for weight gain of 2 lbs overnight or 5 lbs in one week.   4.  Anemia: -With esophageal mucosal changes possible Barrett's esophagus -Patient started on PPI.  5.  Lower extremity edema: -Patient has +2 pitting lower extremity edema -See diuretic regimen noted above. -Patient was advised to wait extremities when dependent obtain TED hose  Disposition: Follow-up with None or APP in 4 months    Medication Adjustments/Labs and Tests Ordered: Current medicines are reviewed at length with the patient today.  Concerns regarding medicines are outlined above.  Informed Consent   Shared Decision Making/Informed Consent The risks (stroke, cardiac arrhythmias rarely resulting in the need for a temporary or permanent pacemaker, skin irritation or burns and complications associated with conscious sedation including aspiration, arrhythmia, respiratory failure and death), benefits (restoration of normal sinus rhythm) and alternatives of a direct current cardioversion were explained in detail to Ms. [place] and she agrees to proceed.        Signed, Napoleon Form, Leodis Rains, NP 10/19/2022, 4:22 PM  [facility]

## 2022-10-18 NOTE — Telephone Encounter (Signed)
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 Are you currently SOB (can you hear that pt is SOB on the phone)? yes  2. How long have you been experiencing SOB? About a week- gotten worse  3. Are you SOB when sitting or when up moving around? Both, but more when she is moving around  4. Are you currently experiencing any other symptoms? Could not sleep last night, weight gain -yesterday it was 123.8 today it is 125.4    STAT if HR is under 50 or over 120 (normal HR is 60-100 beats per minute)  What is your heart rate? Was  between 96 and 111 about an hour  Do you have a log of your heart rate readings (document readings)?   Do you have any other symptoms?  The ones listed above- patient wanted a sooner appointment than 10-20-22- made her appointment with Alden Server tomorrow- please call to evaluatet

## 2022-10-18 NOTE — Telephone Encounter (Signed)
Called pt she states she is SOB, has had weight increase. She states she did not sleep last night. HR 96-111 wile resting 96-100% 130/86 WT yesterday 123.8 today 125.4 Aug 1st 122.8 Pt has SOB while sitting. Pt did not sleep well last night due to having to sleep sitting up. Pt is not prescribed any diuretics. Spoke with DOD (Dr. Rennis Golden) recommends pt keep appt tomorrow and be evaluated. Message relaied to pt and her husband. They verbalized understanding. Patient encouraged to go to the Emergency Department for worsening symptoms. No further questions at this time.

## 2022-10-18 NOTE — H&P (View-Only) (Signed)
Office Visit    Patient Name: Sara Snow Date of Encounter: 10/19/2022  Primary Care Provider:  Juanita Laster, MD Primary Cardiologist:  None Primary Electrophysiologist: None   Past Medical History    Past Medical History:  Diagnosis Date   GERD (gastroesophageal reflux disease)    Hypertension    RA (rheumatoid arthritis) (HCC)    Restless leg syndrome    Stroke [facility])    SVT (supraventricular tachycardia)    Thyroid disease    Past Surgical History:  Procedure Laterality Date   BIOPSY  09/21/2022   Procedure: BIOPSY;  Surgeon: Jenel Lucks, MD;  Location: [facility] ENDOSCOPY;  Service: Gastroenterology;;   ESOPHAGOGASTRODUODENOSCOPY (EGD) WITH PROPOFOL N/A 09/21/2022   Procedure: ESOPHAGOGASTRODUODENOSCOPY (EGD) WITH PROPOFOL;  Surgeon: Jenel Lucks, MD;  Location: [facility] ENDOSCOPY;  Service: Gastroenterology;  Laterality: N/A;    Allergies  Allergies  Allergen Reactions   Hydroxychloroquine Other (See Comments)    Toxicity caused "bulls eye vision"   Reglan [Metoclopramide] Other (See Comments)    nightmares   Amlodipine-Valsartan-Hctz Rash   Celebrex [Celecoxib] Rash    Total body rash     History of Present Illness    Sara Snow  is a 82 year old female with a PMH of atrial fibrillation, HTN, CVA, hypothyroidism, HFpEF, anemia who presents today for complaint of shortness of breath and weight gain.  Sara Snow was seen initially 09/2018 for when she presented to the ED with complaint of shortness of breath and was found to have new onset AF with RVR.  She was treated with IV Cardizem was placed on Eliquis 5 mg twice daily.  2D echo was completed and showed EF of 65-70% with mild concentric LVH and mildly dilated LA with moderate pleural effusion in the left lateral region, mild mitral regurgitation.  She was discharged and DCCV was deferred with plan to have outpatient procedure after 3 to 4 weeks of anticoagulation.  She contacted our office  on 10/18/2022 with complaint of shortness of breath and weight gain.  Had a heart rate in the 90s to low 100s and reported having to sleep sitting up.  Since last being seen in the office patient reports she is experienced increased shortness of breath and increased lower extremity edema.  She has experienced orthopnea and has been sleeping in a recliner over the past 2 to 3 weeks.  Her blood pressure today is controlled at 148/82 and heart rate was 105.  She reports compliance with her current medications and denies any doses of Eliquis over the past 3 to 4 weeks.  She is slightly volume up on examination today.  She denies any sodium indiscretions and has otherwise been compliant with her medications.  Patient denies chest pain, palpitations, dyspnea, PND, orthopnea, nausea, vomiting, dizziness, syncope, edema, weight gain, or early satiety.   Home Medications    Current Outpatient Medications  Medication Sig Dispense Refill   furosemide (LASIX) 20 MG tablet Take 1 tablet (20 mg total) by mouth 2 (two) times daily for 3 days, THEN 1 tablet (20 mg total) daily for 7 days. 15 tablet 0   metoprolol tartrate (LOPRESSOR) 25 MG tablet Take 1 tablet (25 mg total) by mouth daily as needed (HEART RATE OVER 120). 30 tablet 1   potassium chloride (KLOR-CON) 10 MEQ tablet Take 1 tablet (10 mEq total) by mouth daily. 10 tablet 0   Abaloparatide (TYMLOS) 3120 MCG/1.56ML SOPN Inject 80 mcg into the skin daily. (Patient not  taking: Reported on 09/19/2022)     acetaminophen (TYLENOL) 650 MG CR tablet Take 1,300 mg by mouth in the morning, at noon, and at bedtime.     apixaban (ELIQUIS) 2.5 MG TABS tablet Take 1 tablet (2.5 mg total) by mouth 2 (two) times daily. 60 tablet 1   atorvastatin (LIPITOR) 40 MG tablet Take 40 mg by mouth daily with supper.     Calcium Citrate-Vitamin D 315-5 MG-MCG TABS Take 1 tablet by mouth daily.     Carboxymethylcellulose Sod PF (REFRESH PLUS) 0.5 % SOLN Place 1 drop into both eyes 6  (six) times daily.     folic acid (FOLVITE) 1 MG tablet Take 1 mg by mouth daily.     gabapentin (NEURONTIN) 100 MG capsule Take 100-300 mg by mouth at bedtime. For restless leg     levothyroxine (SYNTHROID) 50 MCG tablet Take 50 mcg by mouth daily before breakfast. Due to increase to after completing     metoprolol succinate (TOPROL-XL) 50 MG 24 hr tablet Take 1 tablet (50 mg total) by mouth daily. Take with or immediately following a meal. 30 tablet 1   Multiple Vitamins-Minerals (PRESERVISION AREDS) CAPS Take 1 capsule by mouth in the morning and at bedtime.     pantoprazole (PROTONIX) 40 MG tablet Take 1 tablet (40 mg total) by mouth daily. 30 tablet 1   UNABLE TO FIND Take 750 mg by mouth daily. Bone Restore - Life Extension: take 3 tablets daily     Vitamin D, Ergocalciferol, (DRISDOL) 1.25 MG (50000 UNIT) CAPS capsule Take 50,000 Units by mouth every 7 (seven) days. Sunday     No current facility-administered medications for this visit.     Review of Systems  Please see the history of present illness.    (+) Shortness of breath (+) Lower extremity edema  All other systems reviewed and are otherwise negative except as noted above.  Physical Exam    Wt Readings from Last 3 Encounters:  10/19/22 128 lb (58.1 kg)  09/22/22 122 lb 4.8 oz (55.5 kg)  07/02/21 122 lb (55.3 kg)   VS: Vitals:   10/19/22 1349  BP: (!) 148/82  Pulse: (!) 105  SpO2: 98%  ,Body mass index is 22.67 kg/m.  Constitutional:      Appearance: Healthy appearance. Not in distress.  Neck:     Vascular: JVD normal.  Pulmonary:     Effort: Pulmonary effort is normal.     Breath sounds: No wheezing. No rales. Diminished in the bases Cardiovascular:     Irregularly irregular normal S1. Normal S2.      Murmurs: There is no murmur.  Edema:    +2 bilateral lower extremity edema Abdominal:     Palpations: Abdomen is soft non tender. There is no hepatomegaly.  Skin:    General: Skin is warm and  dry.  Neurological:     General: No focal deficit present.     Mental Status: Alert and oriented to person, place and time.     Cranial Nerves: Cranial nerves are intact.  EKG/LABS/ Recent Cardiac Studies    ECG personally reviewed by me today -atrial fibrillation with rate of 91 bpm and incomplete RBBB with left axis deviation noted and no acute changes since previous EKG.   Risk Assessment/Calculations:    CHA2DS2-VASc Score = 7   This indicates a 11.2% annual risk of stroke. The patient's score is based upon: CHF History: 1 HTN History: 1 Diabetes History: 0  Stroke History: 2 Vascular Disease History: 0 Age Score: 2 Gender Score: 1           Lab Results  Component Value Date   WBC 8.6 09/22/2022   HGB 11.6 (L) 09/22/2022   HCT 35.5 (L) 09/22/2022   MCV 93.4 09/22/2022   PLT 257 09/22/2022   Lab Results  Component Value Date   CREATININE 1.01 (H) 09/22/2022   BUN 14 09/22/2022   NA 131 (L) 09/22/2022   K 4.2 09/22/2022   CL 100 09/22/2022   CO2 22 09/22/2022   Lab Results  Component Value Date   ALT 34 09/19/2022   AST 45 (H) 09/19/2022   ALKPHOS 139 (H) 09/19/2022   BILITOT 1.4 (H) 09/19/2022   No results found for: "CHOL", "HDL", "LDLCALC", "LDLDIRECT", "TRIG", "CHOLHDL"  No results found for: "HGBA1C"   Assessment & Plan    1.  Paroxysmal AF: -Today patient is in atrial fibrillation per EKG with variable rate of 91 to 105 bpm -She is short of breath and symptomatic and has noticed 5 pound weight gain since being discharged from the hospital. -Will send patient for DCCV -CBC and BMET in 1 week -Continue rate control with Toprol-XL 50 mg daily and as needed 25 mg for heart rate greater than 120 bpm. -CHA2DS2-VASc Score = 7 [CHF History: 1, HTN History: 1, Diabetes History: 0, Stroke History: 2, Vascular Disease History: 0, Age Score: 2, Gender Score: 1].  Therefore, the patient's annual risk of stroke is 11.2 %.      2.  Essential  hypertension: -Patient blood pressure today was 148/82 -Continue Toprol-XL 50 mg daily with as needed 25 mg  3.  HFpEF/shortness of breath: -Patient's 2D echo completed showing EF of 65 to 70% with no RWMA and moderate pleural effusion, mild MVR -Today's volume up on exam and has 5 pound weight gain since her hospital discharge. -Patient will take Lasix 20 mg twice daily x 3 days and then 20 mg daily x 1 week -She will take potassium 10 mEq daily -Patient will then take Lasix as needed 20 mg we will check BMET and CBC in 1 week -Low sodium diet, fluid restriction <2L, and daily weights encouraged. Educated to contact our office for weight gain of 2 lbs overnight or 5 lbs in one week.   4.  Anemia: -With esophageal mucosal changes possible Barrett's esophagus -Patient started on PPI.  5.  Lower extremity edema: -Patient has +2 pitting lower extremity edema -See diuretic regimen noted above. -Patient was advised to wait extremities when dependent obtain TED hose  Disposition: Follow-up with None or APP in 4 months    Medication Adjustments/Labs and Tests Ordered: Current medicines are reviewed at length with the patient today.  Concerns regarding medicines are outlined above.  Informed Consent   Shared Decision Making/Informed Consent The risks (stroke, cardiac arrhythmias rarely resulting in the need for a temporary or permanent pacemaker, skin irritation or burns and complications associated with conscious sedation including aspiration, arrhythmia, respiratory failure and death), benefits (restoration of normal sinus rhythm) and alternatives of a direct current cardioversion were explained in detail to Ms. [place] and she agrees to proceed.        Signed, Napoleon Form, Leodis Rains, NP 10/19/2022, 4:22 PM [facility]

## 2022-10-18 NOTE — Progress Notes (Deleted)
Cardiology Clinic Note   Patient Name: Sara Snow Date of Encounter: 10/18/2022  Primary Care Provider:  Juanita Laster, MD Primary Cardiologist:  None  Patient Profile    Sara Snow 82 year old female presents to the clinic today for follow-up evaluation of her atrial fibrillation and hyperlipidemia.  Past Medical History    Past Medical History:  Diagnosis Date   GERD (gastroesophageal reflux disease)    Hypertension    RA (rheumatoid arthritis) (HCC)    Restless leg syndrome    Stroke Sara Snow)    SVT (supraventricular tachycardia)    Thyroid disease    Past Surgical History:  Procedure Laterality Date   BIOPSY  09/21/2022   Procedure: BIOPSY;  Surgeon: Sara Lucks, MD;  Location: [facility] ENDOSCOPY;  Service: Gastroenterology;;   ESOPHAGOGASTRODUODENOSCOPY (EGD) WITH PROPOFOL N/A 09/21/2022   Procedure: ESOPHAGOGASTRODUODENOSCOPY (EGD) WITH PROPOFOL;  Surgeon: Sara Lucks, MD;  Location: [facility] ENDOSCOPY;  Service: Gastroenterology;  Laterality: N/A;    Allergies  Allergies  Allergen Reactions   Hydroxychloroquine Other (See Comments)    Toxicity caused "bulls eye vision"   Reglan [Metoclopramide] Other (See Comments)    nightmares   Amlodipine-Valsartan-Hctz Rash   Celebrex [Celecoxib] Rash    Total body rash    History of Present Illness    [name] has a PMH of atrial fibrillation with RVR, acute diastolic CHF, HTN, anemia, melena, hypothyroidism, CVA, and hypokalemia.  She was admitted to the hospital on 09/20/2022 and discharged on 09/22/2022.  She presented with shortness of breath x 4-5 days as well as lower extremity edema.  She was found to be in atrial fibrillation with RVR.  Her chest x-ray showed pulmonary vascular congestion.  She was noted to have +2 pedal edema.  Echocardiogram was ordered and showed normal LVEF.  Diastolic parameters could not be evaluated.  She received IV Cardizem and heparin GTT.  She was also noted to  have black stools.  GI was consulted.  She underwent EGD on 09/21/2022.  Which showed suspicion of Barrett's esophagus, hiatal hernia, gastritis.  She was educated on lifelong use of PPI and instructed to avoid NSAIDs.  For her atrial fibrillation she was placed on Eliquis.  Her Cardizem drip was transitioned to metoprolol.  CHA2DS2-VASc score 4.  She presents to the clinic today for follow-up evaluation and states***.  *** denies chest pain, shortness of breath, lower extremity edema, fatigue, palpitations, melena, hematuria, hemoptysis, diaphoresis, weakness, presyncope, syncope, orthopnea, and PND.  Atrial fibrillation-EKG today shows***.  Reports compliance with Eliquis.  Denies bleeding issues.  EGD showed suspicion of Barrett's esophagus, hiatal hernia, and gastritis. Continue metoprolol, apixaban Avoid triggers caffeine, chocolate, EtOH, dehydration etc. DCCV?  Acute diastolic CHF-euvolemic.  Weight stable.  Continue fluid restriction.  Low-sodium diet.  Echocardiogram showed normal LVEF and was unable to determine diastolic parameters. Continue metoprolol Fluid restriction Low-sodium diet Daily weights-contact office with a weight increase of 2 to 3 pounds overnight or 5 pounds in 1 week  Essential hypertension-BP today***. Maintain blood pressure log Heart healthy low-sodium diet Continue metoprolol  Hypothyroidism-on Synthroid.  Denies further episodes of accelerated heart rate. Follows with PCP  Hyperlipidemia-LDL***. Continue atorvastatin High-fiber diet Increase physical activity as tolerated  Melena-denies episodes of bright red stool/dark tarry stool.  EGD reassuring.  Continue to avoid NSAIDs Continue pantoprazole GERD diet instructions given  Disposition: Follow-up with Sara Snow or me in 3-4 months.  Home Medications    Prior to Admission medications   Medication  Sig Start Date End Date Taking? Authorizing Provider  Abaloparatide (TYMLOS) 3120 MCG/1.56ML  SOPN Inject 80 mcg into the skin daily. Patient not taking: Reported on 09/19/2022 07/20/22   [provider]  acetaminophen (TYLENOL) 650 MG CR tablet Take 1,300 mg by mouth in the morning, at noon, and at bedtime. 04/21/22   [provider]  apixaban (ELIQUIS) 2.5 MG TABS tablet Take 1 tablet (2.5 mg total) by mouth 2 (two) times daily. 09/22/22   Noralee Stain, DO  atorvastatin (LIPITOR) 40 MG tablet Take 40 mg by mouth daily with supper. 01/31/22   [provider]  Calcium Citrate-Vitamin D 315-5 MG-MCG TABS Take 1 tablet by mouth daily. 06/28/16   [provider]  Carboxymethylcellulose Sod PF (REFRESH PLUS) 0.5 % SOLN Place 1 drop into both eyes 6 (six) times daily. 06/28/16   [provider]  folic acid (FOLVITE) 1 MG tablet Take 1 mg by mouth daily. 07/19/21   [provider]  gabapentin (NEURONTIN) 100 MG capsule Take 100-300 mg by mouth at bedtime. For restless leg 10/11/21   [provider]  levothyroxine (SYNTHROID) 50 MCG tablet Take 50 mcg by mouth daily before breakfast. Due to increase to after completing 04/11/22   [provider]  metoprolol succinate (TOPROL-XL) 50 MG 24 hr tablet Take 1 tablet (50 mg total) by mouth daily. Take with or immediately following a meal. 09/23/22   Noralee Stain, DO  Multiple Vitamins-Minerals (PRESERVISION AREDS) CAPS Take 1 capsule by mouth in the morning and at bedtime. 12/29/15   [provider]  pantoprazole (PROTONIX) 40 MG tablet Take 1 tablet (40 mg total) by mouth daily. 09/22/22   Noralee Stain, DO  UNABLE TO FIND Take 750 mg by mouth daily. Bone Restore - Life Extension: take 3 tablets daily    [provider]  Vitamin D, Ergocalciferol, (DRISDOL) 1.25 MG (50000 UNIT) CAPS capsule Take 50,000 Units by mouth every 7 (seven) days. Sunday 12/14/21   [provider]    Family History    No family history on file. has no family status information  on file.   Social History    Social History   Socioeconomic History   Marital status: Married    Spouse name: Not on file   Number of children: Not on file   Years of education: Not on file   Highest education level: Not on file  Occupational History   Not on file  Tobacco Use   Smoking status: Never    Passive exposure: Never   Smokeless tobacco: Never  Vaping Use   Vaping status: Never Used  Substance and Sexual Activity   Alcohol use: Yes    Comment: occ   Drug use: Not Currently   Sexual activity: Not on file  Other Topics Concern   Not on file  Social History Narrative   Not on file   Social Determinants of Health   Financial Resource Strain: Not on file  Food Insecurity: Patient Declined (09/20/2022)   Hunger Vital Sign    Worried About Running Out of Food in the Last Year: Patient declined    Ran Out of Food in the Last Year: Patient declined  Transportation Needs: No Transportation Needs (09/20/2022)   PRAPARE - Administrator, Civil Service (Medical): No    Lack of Transportation (Non-Medical): No  Physical Activity: Not on file  Stress: Not on file  Social Connections: Not on file  Intimate Partner Violence:  Not At Risk (09/20/2022)   Humiliation, Afraid, Rape, and Kick questionnaire    Fear of Current or Ex-Partner: No    Emotionally Abused: No    Physically Abused: No    Sexually Abused: No     Review of Systems    General:  No chills, fever, night sweats or weight changes.  Cardiovascular:  No chest pain, dyspnea on exertion, edema, orthopnea, palpitations, paroxysmal nocturnal dyspnea. Dermatological: No rash, lesions/masses Respiratory: No cough, dyspnea Urologic: No hematuria, dysuria Abdominal:   No nausea, vomiting, diarrhea, bright red blood per rectum, melena, or hematemesis Neurologic:  No visual changes, wkns, changes in mental status. All other systems reviewed and are otherwise negative except as noted above.  Physical Exam     VS:  There were no vitals taken for this visit. , BMI There is no height or weight on file to calculate BMI. GEN: Well nourished, well developed, in no acute distress. HEENT: normal. Neck: Supple, no JVD, carotid bruits, or masses. Cardiac: RRR, no murmurs, rubs, or gallops. No clubbing, cyanosis, edema.  Radials/DP/PT 2+ and equal bilaterally.  Respiratory:  Respirations regular and unlabored, clear to auscultation bilaterally. GI: Soft, nontender, nondistended, BS + x 4. MS: no deformity or atrophy. Skin: warm and dry, no rash. Neuro:  Strength and sensation are intact. Psych: Normal affect.  Accessory Clinical Findings    Recent Labs: 09/19/2022: ALT 34; B Natriuretic Peptide 677.8 09/20/2022: TSH 5.772 09/22/2022: BUN 14; Creatinine, Ser 1.01; Hemoglobin 11.6; Magnesium 2.0; Platelets 257; Potassium 4.2; Sodium 131   Recent Lipid Panel No results found for: "CHOL", "TRIG", "HDL", "CHOLHDL", "VLDL", "LDLCALC", "LDLDIRECT"  No BP recorded.  {Refresh Note OR Click here to enter BP  :1}***    ECG personally reviewed by me today- ***     Echocardiogram 09/20/2022  IMPRESSIONS     1. Left ventricular ejection fraction, by estimation, is 65 to 70%. The  left ventricle has normal function. The left ventricle has no regional  wall motion abnormalities. There is mild concentric left ventricular  hypertrophy. Left ventricular diastolic  function could not be evaluated.   2. Right ventricular systolic function is normal. The right ventricular  size is normal. Tricuspid regurgitation signal is inadequate for assessing  PA pressure.   3. Left atrial size was mildly dilated.   4. Moderate pleural effusion in the left lateral region.   5. The mitral valve is normal in structure. Mild mitral valve  regurgitation.   6. The aortic valve is tricuspid. Aortic valve regurgitation is not  visualized. No aortic stenosis is present.   7. The inferior vena cava is normal in size with greater  than 50%  respiratory variability, suggesting right atrial pressure of 3 mmHg.   Comparison(s): Echocardiogram done at [facility] on 04/19/21 showed an EF of  60-65%.   FINDINGS   Left Ventricle: Apex was suboptimally seen, otherwise normal LV wall  motion. Left ventricular ejection fraction, by estimation, is 65 to 70%.  The left ventricle has normal function. The left ventricle has no regional  wall motion abnormalities. Global  longitudinal strain performed but not reported based on interpreter  judgement due to suboptimal tracking. The left ventricular internal cavity  size was normal in size. There is mild concentric left ventricular  hypertrophy. Left ventricular diastolic  function could not be evaluated due to atrial fibrillation. Left  ventricular diastolic function could not be evaluated.   Right Ventricle: The right ventricular size is normal. No increase in  right ventricular wall thickness. Right ventricular systolic function is  normal. Tricuspid regurgitation signal is inadequate for assessing PA  pressure.   Left Atrium: Left atrial size was mildly dilated.   Right Atrium: Right atrial size was normal in size.   Pericardium: Trivial pericardial effusion is present.   Mitral Valve: The mitral valve is normal in structure. Mild mitral valve  regurgitation, with posteriorly-directed jet.   Tricuspid Valve: The tricuspid valve is normal in structure. Tricuspid  valve regurgitation is not demonstrated.   Aortic Valve: The aortic valve is tricuspid. Aortic valve regurgitation is  not visualized. No aortic stenosis is present. Aortic valve mean gradient  measures 2.7 mmHg. Aortic valve peak gradient measures 4.3 mmHg. Aortic  valve area, by VTI measures 2.04  cm.   Pulmonic Valve: The pulmonic valve was grossly normal. Pulmonic valve  regurgitation is trivial. No evidence of pulmonic stenosis.   Aorta: The aortic root and ascending aorta are structurally normal, with   no evidence of dilitation.   Venous: The inferior vena cava is normal in size with greater than 50%  respiratory variability, suggesting right atrial pressure of 3 mmHg.   IAS/Shunts: No atrial level shunt detected by color flow Doppler.      Assessment & Plan   1.  ***   Thomasene Ripple.  NP-C     10/18/2022, 7:08 AM [facility] [address] Office [phone] Fax [phone]    I spent***minutes examining this patient, reviewing medications, and using patient centered shared decision making involving her cardiac care.  Prior to her visit I spent greater than 20 minutes reviewing her past medical history,  medications, and prior cardiac tests.

## 2022-10-19 ENCOUNTER — Ambulatory Visit: Payer: Medicare Other | Attending: Nurse Practitioner | Admitting: Nurse Practitioner

## 2022-10-19 ENCOUNTER — Encounter: Payer: Self-pay | Admitting: Nurse Practitioner

## 2022-10-19 VITALS — BP 148/82 | HR 105 | Ht 63.0 in | Wt 128.0 lb

## 2022-10-19 DIAGNOSIS — I480 Paroxysmal atrial fibrillation: Secondary | ICD-10-CM

## 2022-10-19 DIAGNOSIS — R600 Localized edema: Secondary | ICD-10-CM

## 2022-10-19 DIAGNOSIS — I5032 Chronic diastolic (congestive) heart failure: Secondary | ICD-10-CM | POA: Diagnosis present

## 2022-10-19 DIAGNOSIS — I10 Essential (primary) hypertension: Secondary | ICD-10-CM

## 2022-10-19 DIAGNOSIS — D649 Anemia, unspecified: Secondary | ICD-10-CM

## 2022-10-19 MED ORDER — METOPROLOL TARTRATE 25 MG PO TABS: 25.0000 mg | ORAL_TABLET | Freq: Every day | ORAL | 1 refills | Status: AC | PRN

## 2022-10-19 MED ORDER — FUROSEMIDE 20 MG PO TABS: ORAL_TABLET | ORAL | 0 refills | Status: DC

## 2022-10-19 MED ORDER — POTASSIUM CHLORIDE ER 10 MEQ PO TBCR: 10.0000 meq | EXTENDED_RELEASE_TABLET | Freq: Every day | ORAL | 0 refills | Status: DC

## 2022-10-19 NOTE — Patient Instructions (Addendum)
Medication Instructions:  Take PRN Toprol XL 25mg  if heart rate is over 120  START Lasix 20mg  Take 1 tablet two times a day for 3 days then once a day  START Postasium Once a day with lasix  *If you need a refill on your cardiac medications before your next appointment, please call your pharmacy*   Lab Work: 1 WEEK BMET & CBC (Tuesday next week at [facility]) If you have labs (blood work) drawn today and your tests are completely normal, you will receive your results only by: MyChart Message (if you have MyChart) OR A paper copy in the mail If you have any lab test that is abnormal or we need to change your treatment, we will call you to review the results.   Testing/Procedures: Your physician has recommended that you have a Cardioversion (DCCV). Electrical Cardioversion uses a jolt of electricity to your heart either through paddles or wired patches attached to your chest. This is a controlled, usually prescheduled, procedure. Defibrillation is done under light anesthesia in the hospital, and you usually go home the day of the procedure. This is done to get your heart back into a normal rhythm. You are not awake for the procedure. Please see the instruction sheet given to you today.   Follow-Up: At [facility], you and your health needs are our priority.  As part of our continuing mission to provide you with exceptional heart care, we have created designated Provider Care Teams.  These Care Teams include your primary Cardiologist (physician) and Advanced Practice Providers (APPs -  Physician Assistants and Nurse Practitioners) who all work together to provide you with the care you need, when you need it.  We recommend signing up for the patient portal called "MyChart".  Sign up information is provided on this After Visit Summary.  MyChart is used to connect with patients for Virtual Visits (Telemedicine).  Patients are able to view lab/test results, encounter notes, upcoming  appointments, etc.  Non-urgent messages can be sent to your provider as well.   To learn more about what you can do with MyChart, go to [email or web address].    Your next appointment:   4 week(s)  Provider:   Edd Fabian, FNP      Other Instructions: GET SOME TED HOSE/COMPRESSION STOCKINGS WEAR DURING THE DAY AND REMOVE AT NIGHT ELEVATE YOUR LEGS WHEN SITTING  CARDIOVERSION INSTRUCTIONS       Dear Sara Snow   You are scheduled for a Cardioversion on Wednesday, August 14 with Dr. Jens Som.  Please arrive at the [facility] (Main Entrance A) at [facility]: [address] [place], [place] [address] at 11:45 AM (This time is 1 hour(s) before your procedure to ensure your preparation). Free valet parking service is available. You will check in at ADMITTING. The support person will be asked to wait in the waiting room.  It is OK to have someone drop you off and come back when you are ready to be discharged.     DIET:  Nothing to eat or drink after midnight except a sip of water with medications (see medication instructions below)  MEDICATION INSTRUCTIONS: !!IF ANY NEW MEDICATIONS ARE STARTED AFTER TODAY, PLEASE NOTIFY YOUR PROVIDER AS SOON AS POSSIBLE!!  FYI: Medications such as Semaglutide (Ozempic, [place]), Tirzepatide (Mounjaro, Zepbound), Dulaglutide (Trulicity), etc ("GLP1 agonists") AND Canagliflozin (Invokana), Dapagliflozin (Farxiga), Empagliflozin (Jardiance), Ertugliflozin (Steglatro), Bexagliflozin [employer]) or any combination with one of these drugs such as Invokamet (Canagliflozin/Metformin), Synjardy (Empagliflozin/Metformin), etc ("SGLT2  inhibitors") must be held around the time of a procedure. This is not a comprehensive list of all of these drugs. Please review all of your medications and talk to your provider if you take any one of these. If you are not sure, ask your provider.   Continue taking your anticoagulant (blood thinner): Apixaban (Eliquis).   You will need to continue this after your procedure until you are told by your provider that it is safe to stop.    HOLD Lasix on the morning of your procedure.   LABS: TUESDAY AT [facility] OFFICE Come to the lab at [facility] at [address] between the hours of 8:00 am and 4:30 pm. You do NOT have to be fasting.   FYI:  For your safety, and to allow [place] to monitor your vital signs accurately during the surgery/procedure we request: If you have artificial nails, gel coating, SNS etc, please have those removed prior to your surgery/procedure. Not having the nail coverings /polish removed may result in cancellation or delay of your surgery/procedure.  You must have a responsible person to drive you home and stay in the waiting area during your procedure. Failure to do so could result in cancellation.  Bring your insurance cards.  *Special Note: Every effort is made to have your procedure done on time. Occasionally there are emergencies that occur at the hospital that may cause delays. Please be patient if a delay does occur.

## 2022-10-20 ENCOUNTER — Ambulatory Visit: Payer: Medicare Other | Admitting: General Practice

## 2022-10-25 NOTE — Progress Notes (Signed)
Spoke to pt and instructed them to come at 1130 and to be NPO after 0000. Confirmed no missed doses of AC and instructed to take in AM with a small sip of water.   Confirmed that pt will have a ride home and someone to stay with them for 24 hours after the procedure.

## 2022-10-26 ENCOUNTER — Ambulatory Visit (HOSPITAL_BASED_OUTPATIENT_CLINIC_OR_DEPARTMENT_OTHER): Payer: Medicare Other | Admitting: Registered Nurse

## 2022-10-26 ENCOUNTER — Encounter (HOSPITAL_COMMUNITY)
Admission: RE | Disposition: A | Discharge status: Home / Self Care | Payer: Self-pay | Source: Ambulatory Visit | Attending: Cardiology

## 2022-10-26 ENCOUNTER — Encounter (HOSPITAL_COMMUNITY): Payer: Self-pay | Admitting: Cardiology

## 2022-10-26 ENCOUNTER — Other Ambulatory Visit: Payer: Self-pay

## 2022-10-26 ENCOUNTER — Ambulatory Visit (HOSPITAL_COMMUNITY): Payer: Medicare Other | Admitting: Registered Nurse

## 2022-10-26 ENCOUNTER — Ambulatory Visit (HOSPITAL_COMMUNITY)
Admission: RE | Admit: 2022-10-26 | Discharge: 2022-10-26 | Disposition: A | Discharge status: Home / Self Care | Payer: Medicare Other | Source: Ambulatory Visit | Attending: Cardiology | Admitting: Cardiology

## 2022-10-26 ENCOUNTER — Ambulatory Visit: Attending: Nurse Practitioner

## 2022-10-26 DIAGNOSIS — I4891 Unspecified atrial fibrillation: Secondary | ICD-10-CM | POA: Diagnosis not present

## 2022-10-26 DIAGNOSIS — I110 Hypertensive heart disease with heart failure: Secondary | ICD-10-CM | POA: Diagnosis not present

## 2022-10-26 DIAGNOSIS — R600 Localized edema: Secondary | ICD-10-CM | POA: Insufficient documentation

## 2022-10-26 DIAGNOSIS — I480 Paroxysmal atrial fibrillation: Secondary | ICD-10-CM | POA: Insufficient documentation

## 2022-10-26 DIAGNOSIS — I5032 Chronic diastolic (congestive) heart failure: Secondary | ICD-10-CM | POA: Insufficient documentation

## 2022-10-26 DIAGNOSIS — Z79899 Other long term (current) drug therapy: Secondary | ICD-10-CM | POA: Insufficient documentation

## 2022-10-26 DIAGNOSIS — Z7901 Long term (current) use of anticoagulants: Secondary | ICD-10-CM | POA: Insufficient documentation

## 2022-10-26 DIAGNOSIS — D649 Anemia, unspecified: Secondary | ICD-10-CM | POA: Insufficient documentation

## 2022-10-26 HISTORY — PX: CARDIOVERSION: SHX1299

## 2022-10-26 SURGERY — CARDIOVERSION
Anesthesia: General

## 2022-10-26 MED ORDER — PROPOFOL 10 MG/ML IV BOLUS
INTRAVENOUS | Status: DC | PRN
Start: 2022-10-26 — End: 2022-10-26
  Administered 2022-10-26: 50 mg via INTRAVENOUS

## 2022-10-26 MED ORDER — SODIUM CHLORIDE 0.9 % IV SOLN: INTRAVENOUS | Status: DC

## 2022-10-26 MED ORDER — LIDOCAINE 2% (20 MG/ML) 5 ML SYRINGE
INTRAMUSCULAR | Status: DC | PRN
  Administered 2022-10-26: 100 mg via INTRAVENOUS

## 2022-10-26 SURGICAL SUPPLY — 1 items: ELECT DEFIB PAD ADLT CADENCE (PAD) ×1 IMPLANT

## 2022-10-26 NOTE — Anesthesia Postprocedure Evaluation (Signed)
Anesthesia Post Note  Patient: Sara Snow  Procedure(s) Performed: CARDIOVERSION     Patient location during evaluation: PACU Anesthesia Type: General Level of consciousness: awake and alert Pain management: pain level controlled Vital Signs Assessment: post-procedure vital signs reviewed and stable Respiratory status: spontaneous breathing, nonlabored ventilation, respiratory function stable and patient connected to nasal cannula oxygen Cardiovascular status: blood pressure returned to baseline and stable Postop Assessment: no apparent nausea or vomiting Anesthetic complications: no   There were no known notable events for this encounter.  Last Vitals:  Vitals:   10/26/22 1415 10/26/22 1420  BP: 125/72 123/77  Pulse: (!) 53 61  Resp: 13 16  Temp:  36.7 C  SpO2: 100% 95%    Last Pain:  Vitals:   10/26/22 1420  TempSrc: Temporal  PainSc: 0-No pain                 Mariann Barter

## 2022-10-26 NOTE — CV Procedure (Signed)
   Electrical Cardioversion Procedure Note Lahela Waxler [id number] May 17, 1940  Procedure: Electrical Cardioversion Indications:  Atrial Fibrillation  Time Out: Verified patient identification, verified procedure,medications/allergies/relevent history reviewed, required imaging and test results available.  Performed  Procedure Details  The patient signed informed consent.   The patient was NPO past midnight. Has had therapeutic anticoagulation with Eliquis greater than 3 weeks. The patient denies any interruption of anticoagulation.  Anesthesia was administered by Dr. Ace Gins.  Adequate airway was maintained throughout and vital followed per protocol.  She was cardioverted x 1 with 200J of biphasic synchronized energy. She converted to NSR.  There were no apparent complications.  The patient tolerated the procedure well and had normal neuro status and respiratory status post procedure with vitals stable as recorded elsewhere.     IMPRESSION:  Successful cardioversion of atrial fibrillation   Follow up:  We will arrange follow up with her primary cardiologist.  He will continue on current medical therapy.  The patient advised to continue anticoagulation.  Lakysha Kossman 10/26/2022, 1:55 PM

## 2022-10-26 NOTE — Anesthesia Preprocedure Evaluation (Signed)
Anesthesia Evaluation  Patient identified by MRN, date of birth, ID band Patient awake    Reviewed: Allergy & Precautions, NPO status , Patient's Chart, lab work & pertinent test results, reviewed documented beta blocker date and time   History of Anesthesia Complications Negative for: history of anesthetic complications  Airway Mallampati: IV  TM Distance: >3 FB Neck ROM: Full  Mouth opening: Limited Mouth Opening  Dental no notable dental hx.    Pulmonary neg COPD, neg PE Moderate pleural effusion on last TTE   breath sounds clear to auscultation       Cardiovascular hypertension, +CHF  (-) CAD, (-) Past MI, (-) Cardiac Stents and (-) Orthopnea  Rhythm:Irregular Rate:Normal     Neuro/Psych CVA, Residual Symptoms    GI/Hepatic ,GERD  ,,(+) neg Cirrhosis        Endo/Other  Hypothyroidism    Renal/GU ARFRenal disease     Musculoskeletal  (+) Arthritis ,    Abdominal   Peds  Hematology  (+) Blood dyscrasia, anemia   Anesthesia Other Findings   Reproductive/Obstetrics                              Anesthesia Physical Anesthesia Plan  ASA: 2  Anesthesia Plan: General   Post-op Pain Management:    Induction:   PONV Risk Score and Plan:   Airway Management Planned: Mask  Additional Equipment:   Intra-op Plan:   Post-operative Plan: Extubation in OR  Informed Consent: I have reviewed the patients History and Physical, chart, labs and discussed the procedure including the risks, benefits and alternatives for the proposed anesthesia with the patient or authorized representative who has indicated his/her understanding and acceptance.     Dental advisory given  Plan Discussed with: CRNA  Anesthesia Plan Comments:          Anesthesia Quick Evaluation

## 2022-10-26 NOTE — Transfer of Care (Addendum)
Immediate Anesthesia Transfer of Care Note  Patient: Sara Snow  Procedure(s) Performed: CARDIOVERSION  Patient Location: PACU  Anesthesia Type:MAC  Level of Consciousness: awake, alert , and oriented  Airway & Oxygen Therapy: Patient Spontanous Breathing  Post-op Assessment: Report given to RN and Post -op Vital signs reviewed and stable  Post vital signs: Reviewed and stable  Last Vitals:  Vitals Value Taken Time  BP 100/61   Temp    Pulse 67 10/26/22 1356  Resp 19 10/26/22 1356  SpO2 100 % 10/26/22 1356  Vitals shown include unfiled device data.  Last Pain:  Vitals:   10/26/22 1159  TempSrc:   PainSc: 0-No pain         Complications: There were no known notable events for this encounter.

## 2022-10-26 NOTE — Interval H&P Note (Signed)
History and Physical Interval Note:  10/26/2022 12:56 PM  Sara Snow  has presented today for surgery, with the diagnosis of AFIB.  The various methods of treatment have been discussed with the patient and family. After consideration of risks, benefits and other options for treatment, the patient has consented to  Procedure(s): CARDIOVERSION (N/A) as a surgical intervention.  The patient's history has been reviewed, patient examined, no change in status, stable for surgery.  I have reviewed the patient's chart and labs.  Questions were answered to the patient's satisfaction.     Teresa Lemmerman

## 2022-10-27 ENCOUNTER — Encounter (HOSPITAL_COMMUNITY): Payer: Self-pay | Admitting: Cardiology

## 2022-11-11 NOTE — Progress Notes (Unsigned)
Cardiology Clinic Note   Patient Name: Sara Snow Date of Encounter: 11/16/2022  Primary Care Provider:  Karna Dupes, MD Primary Cardiologist:  None  Patient Profile    Sara Snow 82 year old female presents to the clinic today for follow-up evaluation of her atrial fibrillation and hyperlipidemia.  Past Medical History    Past Medical History:  Diagnosis Date   GERD (gastroesophageal reflux disease)    Hypertension    RA (rheumatoid arthritis) (HCC)    Restless leg syndrome    Stroke [facility])    SVT (supraventricular tachycardia)    Thyroid disease    Past Surgical History:  Procedure Laterality Date   BIOPSY  09/21/2022   Procedure: BIOPSY;  Surgeon: Jenel Lucks, MD;  Location: [facility] ENDOSCOPY;  Service: Gastroenterology;;   CARDIOVERSION N/A 10/26/2022   Procedure: CARDIOVERSION;  Surgeon: Thomasene Ripple, DO;  Location: [facility] INVASIVE CV LAB;  Service: Cardiovascular;  Laterality: N/A;   ESOPHAGOGASTRODUODENOSCOPY (EGD) WITH PROPOFOL N/A 09/21/2022   Procedure: ESOPHAGOGASTRODUODENOSCOPY (EGD) WITH PROPOFOL;  Surgeon: Jenel Lucks, MD;  Location: [facility] ENDOSCOPY;  Service: Gastroenterology;  Laterality: N/A;    Allergies  Allergies  Allergen Reactions   Plaquenil [Hydroxychloroquine] Other (See Comments)    Toxicity caused "bulls eye vision"   Reglan [Metoclopramide] Other (See Comments)    nightmares   Amlodipine-Valsartan-Hctz Rash   Celebrex [Celecoxib] Rash    Total body rash    History of Present Illness    Sara Snow has a PMH of atrial fibrillation with RVR, acute diastolic CHF, HTN, anemia, melena, hypothyroidism, CVA, and hypokalemia.  She was admitted to the hospital on 09/20/2022 and discharged on 09/22/2022.  She presented with shortness of breath x 4-5 days as well as lower extremity edema.  She was found to be in atrial fibrillation with RVR.  Her chest x-ray showed pulmonary vascular congestion.  She was noted to have +2  pedal edema.  Echocardiogram was ordered and showed normal LVEF.  Diastolic parameters could not be evaluated.  She received IV Cardizem and heparin GTT.  She was also noted to have black stools.  GI was consulted.  She underwent EGD on 09/21/2022.  Which showed suspicion of Barrett's esophagus, hiatal hernia, gastritis.  She was educated on lifelong use of PPI and instructed to avoid NSAIDs.  For her atrial fibrillation she was placed on Eliquis.  Her Cardizem drip was transitioned to metoprolol.  CHA2DS2-VASc score 4.  She presents to the clinic today for follow-up evaluation and states she has noted some shortness of breath with increased physical activity and some squeezing around the bottom of her chest with increased physical activity.  We reviewed her medications and cardioversion procedure from 10/26/2022.  She and her husband expressed understanding.  She does not know when she went back into atrial flutter/fibrillation.  She continues to take her Eliquis as prescribed.  Her blood pressure is well-controlled today at 124/78.  Reports compliance with CPAP.  Her EKG shows atrial flutter with variable AV block 88 bpm.  We discussed options for atrial fibrillation, repeat DCCV, and referral to A-fib clinic.  She wishes to be referred to A-fib clinic at this time.  Today she denies chest pain,  lower extremity edema, fatigue, palpitations, melena, hematuria, hemoptysis, diaphoresis, weakness, presyncope, syncope, orthopnea, and PND.    Home Medications    Prior to Admission medications   Medication Sig Start Date End Date Taking? Authorizing Provider  Abaloparatide (TYMLOS) 3120 MCG/1.56ML SOPN Inject 80 mcg into  the skin daily. Patient not taking: Reported on 09/19/2022 07/20/22   [provider]  acetaminophen (TYLENOL) 650 MG CR tablet Take 1,300 mg by mouth in the morning, at noon, and at bedtime. 04/21/22   [provider]  apixaban (ELIQUIS) 2.5 MG TABS tablet Take 1 tablet (2.5  mg total) by mouth 2 (two) times daily. 09/22/22   Noralee Stain, DO  atorvastatin (LIPITOR) 40 MG tablet Take 40 mg by mouth daily with supper. 01/31/22   [provider]  Calcium Citrate-Vitamin D 315-5 MG-MCG TABS Take 1 tablet by mouth daily. 06/28/16   [provider]  Carboxymethylcellulose Sod PF (REFRESH PLUS) 0.5 % SOLN Place 1 drop into both eyes 6 (six) times daily. 06/28/16   [provider]  folic acid (FOLVITE) 1 MG tablet Take 1 mg by mouth daily. 07/19/21   [provider]  gabapentin (NEURONTIN) 100 MG capsule Take 100-300 mg by mouth at bedtime. For restless leg 10/11/21   [provider]  levothyroxine (SYNTHROID) 50 MCG tablet Take 50 mcg by mouth daily before breakfast. Due to increase to after completing 04/11/22   [provider]  metoprolol succinate (TOPROL-XL) 50 MG 24 hr tablet Take 1 tablet (50 mg total) by mouth daily. Take with or immediately following a meal. 09/23/22   Noralee Stain, DO  Multiple Vitamins-Minerals (PRESERVISION AREDS) CAPS Take 1 capsule by mouth in the morning and at bedtime. 12/29/15   [provider]  pantoprazole (PROTONIX) 40 MG tablet Take 1 tablet (40 mg total) by mouth daily. 09/22/22   Noralee Stain, DO  UNABLE TO FIND Take 750 mg by mouth daily. Bone Restore - Life Extension: take 3 tablets daily    [provider]  Vitamin D, Ergocalciferol, (DRISDOL) 1.25 MG (50000 UNIT) CAPS capsule Take 50,000 Units by mouth every 7 (seven) days. Sunday 12/14/21   [provider]    Family History    History reviewed. No pertinent family history. has no family status information on file.   Social History    Social History   Socioeconomic History   Marital status: Married    Spouse name: Not on file   Number of children: Not on file   Years of education: Not on file   Highest education level: Not on file  Occupational History   Not on file  Tobacco Use    Smoking status: Never    Passive exposure: Never   Smokeless tobacco: Never  Vaping Use   Vaping status: Never Used  Substance and Sexual Activity   Alcohol use: Yes    Comment: occ   Drug use: Not Currently   Sexual activity: Not on file  Other Topics Concern   Not on file  Social History Narrative   Not on file   Social Determinants of Health   Financial Resource Strain: Not on file  Food Insecurity: Patient Declined (09/20/2022)   Hunger Vital Sign    Worried About Running Out of Food in the Last Year: Patient declined    Ran Out of Food in the Last Year: Patient declined  Transportation Needs: No Transportation Needs (09/20/2022)   PRAPARE - Administrator, Civil Service (Medical): No    Lack of Transportation (Non-Medical): No  Physical Activity: Not on file  Stress: Not on file  Social Connections: Not on file  Intimate Partner Violence: Not At Risk (09/20/2022)   Humiliation, Afraid, Rape, and Kick questionnaire    Fear of  Current or Ex-Partner: No    Emotionally Abused: No    Physically Abused: No    Sexually Abused: No     Review of Systems    General:  No chills, fever, night sweats or weight changes.  Cardiovascular:  No chest pain, dyspnea on exertion, edema, orthopnea, palpitations, paroxysmal nocturnal dyspnea. Dermatological: No rash, lesions/masses Respiratory: No cough, dyspnea Urologic: No hematuria, dysuria Abdominal:   No nausea, vomiting, diarrhea, bright red blood per rectum, melena, or hematemesis Neurologic:  No visual changes, wkns, changes in mental status. All other systems reviewed and are otherwise negative except as noted above.  Physical Exam    VS:  BP 124/78   Pulse 88   Ht 5' 3.5" (1.613 m)   Wt 127 lb 9.6 oz (57.9 kg)   SpO2 99%   BMI 22.25 kg/m  , BMI Body mass index is 22.25 kg/m. GEN: Well nourished, well developed, in no acute distress. HEENT: normal. Neck: Supple, no JVD, carotid bruits, or masses. Cardiac:  Irregularly irregular, no murmurs, rubs, or gallops. No clubbing, cyanosis, generalized bilateral nonpitting edema left greater than right.  Radials/DP/PT 2+ and equal bilaterally.  Respiratory:  Respirations regular and unlabored, clear to auscultation bilaterally. GI: Soft, nontender, nondistended, BS + x 4. MS: no deformity or atrophy. Skin: warm and dry, no rash. Neuro:  Strength and sensation are intact. Psych: Normal affect.  Accessory Clinical Findings    Recent Labs: 09/19/2022: ALT 34; B Natriuretic Peptide 677.8 09/20/2022: TSH 5.772 09/22/2022: BUN 14; Creatinine, Ser 1.01; Hemoglobin 11.6; Magnesium 2.0; Platelets 257; Potassium 4.2; Sodium 131   Recent Lipid Panel No results found for: "CHOL", "TRIG", "HDL", "CHOLHDL", "VLDL", "LDLCALC", "LDLDIRECT"       ECG personally reviewed by me today- EKG Interpretation Date/Time:  Wednesday November 16 2022 14:13:21 EDT Ventricular Rate:  88 PR Interval:    QRS Duration:  116 QT Interval:  384 QTC Calculation: 464 R Axis:   -46  Text Interpretation: Atrial flutter with variable A-V block Left axis deviation Low voltage QRS Confirmed by Edd Fabian [phone]) on 11/16/2022 2:22:11 PM    Echocardiogram 09/20/2022  IMPRESSIONS     1. Left ventricular ejection fraction, by estimation, is 65 to 70%. The  left ventricle has normal function. The left ventricle has no regional  wall motion abnormalities. There is mild concentric left ventricular  hypertrophy. Left ventricular diastolic  function could not be evaluated.   2. Right ventricular systolic function is normal. The right ventricular  size is normal. Tricuspid regurgitation signal is inadequate for assessing  PA pressure.   3. Left atrial size was mildly dilated.   4. Moderate pleural effusion in the left lateral region.   5. The mitral valve is normal in structure. Mild mitral valve  regurgitation.   6. The aortic valve is tricuspid. Aortic valve regurgitation is not   visualized. No aortic stenosis is present.   7. The inferior vena cava is normal in size with greater than 50%  respiratory variability, suggesting right atrial pressure of 3 mmHg.   Comparison(s): Echocardiogram done at [facility] on 04/19/21 showed an EF of  60-65%.   FINDINGS   Left Ventricle: Apex was suboptimally seen, otherwise normal LV wall  motion. Left ventricular ejection fraction, by estimation, is 65 to 70%.  The left ventricle has normal function. The left ventricle has no regional  wall motion abnormalities. Global  longitudinal strain performed but not reported based on interpreter  judgement due to suboptimal tracking.  The left ventricular internal cavity  size was normal in size. There is mild concentric left ventricular  hypertrophy. Left ventricular diastolic  function could not be evaluated due to atrial fibrillation. Left  ventricular diastolic function could not be evaluated.   Right Ventricle: The right ventricular size is normal. No increase in  right ventricular wall thickness. Right ventricular systolic function is  normal. Tricuspid regurgitation signal is inadequate for assessing PA  pressure.   Left Atrium: Left atrial size was mildly dilated.   Right Atrium: Right atrial size was normal in size.   Pericardium: Trivial pericardial effusion is present.   Mitral Valve: The mitral valve is normal in structure. Mild mitral valve  regurgitation, with posteriorly-directed jet.   Tricuspid Valve: The tricuspid valve is normal in structure. Tricuspid  valve regurgitation is not demonstrated.   Aortic Valve: The aortic valve is tricuspid. Aortic valve regurgitation is  not visualized. No aortic stenosis is present. Aortic valve mean gradient  measures 2.7 mmHg. Aortic valve peak gradient measures 4.3 mmHg. Aortic  valve area, by VTI measures 2.04  cm.   Pulmonic Valve: The pulmonic valve was grossly normal. Pulmonic valve  regurgitation is trivial. No  evidence of pulmonic stenosis.   Aorta: The aortic root and ascending aorta are structurally normal, with  no evidence of dilitation.   Venous: The inferior vena cava is normal in size with greater than 50%  respiratory variability, suggesting right atrial pressure of 3 mmHg.   IAS/Shunts: No atrial level shunt detected by color flow Doppler.      Assessment & Plan   1.  Atrial fibrillation-EKG today shows atrial flutter 88B PM .  Reports compliance with Eliquis.  Denies bleeding issues.  EGD showed suspicion of Barrett's esophagus, hiatal hernia, and gastritis.  Underwent successful cardioversion on 10/26/2022.  Cardiac unaware. Continue metoprolol, apixaban Avoid triggers caffeine, chocolate, EtOH, dehydration etc. Refer to A-fib clinic  Acute diastolic CHF-euvolemic.  Weight stable.  Continue fluid restriction.  Low-sodium diet.  Echocardiogram showed normal LVEF and was unable to determine diastolic parameters. Continue metoprolol Fluid restriction Low-sodium diet Daily weights-contact office with a weight increase of 2 to 3 pounds overnight or 5 pounds in 1 week  Essential hypertension-BP today 124/78. Maintain blood pressure log Heart healthy low-sodium diet Continue metoprolol  Hypothyroidism-on Synthroid.  Denies further episodes of accelerated heart rate, cardiac unaware. Follows with PCP  Hyperlipidemia-Follows with Dr. Harrison Mons Continue atorvastatin High-fiber diet Increase physical activity as tolerated  Melena-denies episodes of bright red stool/dark tarry stool.  EGD reassuring.  Continue to avoid NSAIDs Continue pantoprazole GERD diet  Disposition: Follow-up with Dr. Servando Salina or me in 4-6 months.   Thomasene Ripple. Akshith Moncus NP-C     11/16/2022, 2:22 PM [facility] [address] Office [phone] Fax [phone]    I spent 14 minutes examining this patient, reviewing medications, and using patient centered shared decision  making involving her cardiac care.  Prior to her visit I spent greater than 20 minutes reviewing her past medical history,  medications, and prior cardiac tests.

## 2022-11-16 ENCOUNTER — Encounter: Payer: Self-pay | Admitting: General Practice

## 2022-11-16 ENCOUNTER — Ambulatory Visit: Payer: Medicare Other | Attending: General Practice | Admitting: General Practice

## 2022-11-16 VITALS — BP 124/78 | HR 88 | Ht 63.5 in | Wt 127.6 lb

## 2022-11-16 DIAGNOSIS — E038 Other specified hypothyroidism: Secondary | ICD-10-CM | POA: Insufficient documentation

## 2022-11-16 DIAGNOSIS — I10 Essential (primary) hypertension: Secondary | ICD-10-CM | POA: Insufficient documentation

## 2022-11-16 DIAGNOSIS — I5031 Acute diastolic (congestive) heart failure: Secondary | ICD-10-CM | POA: Insufficient documentation

## 2022-11-16 DIAGNOSIS — I4891 Unspecified atrial fibrillation: Secondary | ICD-10-CM | POA: Insufficient documentation

## 2022-11-16 DIAGNOSIS — K921 Melena: Secondary | ICD-10-CM | POA: Diagnosis present

## 2022-11-16 DIAGNOSIS — E782 Mixed hyperlipidemia: Secondary | ICD-10-CM | POA: Insufficient documentation

## 2022-11-16 NOTE — Patient Instructions (Addendum)
Medication Instructions:  The current medical regimen is effective;  continue present plan and medications as directed. Please refer to the Current Medication list given to you today.  *If you need a refill on your cardiac medications before your next appointment, please call your pharmacy*  Lab Work: NONE If you have labs (blood work) drawn today and your tests are completely normal, you will receive your results only by: MyChart Message (if you have MyChart) OR A paper copy in the mail If you have any lab test that is abnormal or we need to change your treatment, we will call you to review the results.  Other Instructions MAINTAIN HYDRATION CONTINUE LIGHT PHYSICAL ACTIVITY SEE TRIGGERS BELOW  Follow-Up: At [facility], you and your health needs are our priority.  As part of our continuing mission to provide you with exceptional heart care, we have created designated Provider Care Teams.  These Care Teams include your primary Cardiologist (physician) and Advanced Practice Providers (APPs -  Physician Assistants and Nurse Practitioners) who all work together to provide you with the care you need, when you need it.  Your next appointment:   6 month(s)  Provider:   Thomasene Ripple, DO     Please try to avoid these triggers: Do not use any products that have nicotine or tobacco in them. These include cigarettes, e-cigarettes, and chewing tobacco. If you need help quitting, ask your doctor. Eat heart-healthy foods. Talk with your doctor about the right eating plan for you. Exercise regularly as told by your doctor. Stay hydrated Do not drink alcohol, Caffeine or chocolate. Lose weight if you are overweight. Do not use drugs, including cannabis

## 2022-11-22 ENCOUNTER — Other Ambulatory Visit (HOSPITAL_COMMUNITY): Payer: Self-pay

## 2022-11-22 ENCOUNTER — Other Ambulatory Visit: Payer: Self-pay

## 2022-11-23 ENCOUNTER — Other Ambulatory Visit (HOSPITAL_BASED_OUTPATIENT_CLINIC_OR_DEPARTMENT_OTHER): Payer: Self-pay

## 2022-12-02 ENCOUNTER — Telehealth (HOSPITAL_COMMUNITY): Payer: Self-pay

## 2022-12-02 ENCOUNTER — Ambulatory Visit (HOSPITAL_COMMUNITY)
Admission: RE | Admit: 2022-12-02 | Discharge: 2022-12-02 | Disposition: A | Discharge status: Home / Self Care | Payer: Medicare Other | Source: Ambulatory Visit | Attending: Internal Medicine | Admitting: Internal Medicine

## 2022-12-02 VITALS — BP 138/86 | HR 92 | Ht 63.5 in | Wt 129.8 lb

## 2022-12-02 DIAGNOSIS — Z79899 Other long term (current) drug therapy: Secondary | ICD-10-CM | POA: Diagnosis not present

## 2022-12-02 DIAGNOSIS — Z7901 Long term (current) use of anticoagulants: Secondary | ICD-10-CM | POA: Insufficient documentation

## 2022-12-02 DIAGNOSIS — Z8673 Personal history of transient ischemic attack (TIA), and cerebral infarction without residual deficits: Secondary | ICD-10-CM | POA: Diagnosis not present

## 2022-12-02 DIAGNOSIS — Z79890 Hormone replacement therapy (postmenopausal): Secondary | ICD-10-CM | POA: Diagnosis not present

## 2022-12-02 DIAGNOSIS — I4819 Other persistent atrial fibrillation: Secondary | ICD-10-CM | POA: Diagnosis not present

## 2022-12-02 DIAGNOSIS — D6869 Other thrombophilia: Secondary | ICD-10-CM | POA: Diagnosis not present

## 2022-12-02 DIAGNOSIS — I5030 Unspecified diastolic (congestive) heart failure: Secondary | ICD-10-CM | POA: Insufficient documentation

## 2022-12-02 DIAGNOSIS — I4510 Unspecified right bundle-branch block: Secondary | ICD-10-CM | POA: Diagnosis not present

## 2022-12-02 DIAGNOSIS — I4891 Unspecified atrial fibrillation: Secondary | ICD-10-CM | POA: Diagnosis present

## 2022-12-02 DIAGNOSIS — I4892 Unspecified atrial flutter: Secondary | ICD-10-CM | POA: Insufficient documentation

## 2022-12-02 DIAGNOSIS — I110 Hypertensive heart disease with heart failure: Secondary | ICD-10-CM | POA: Insufficient documentation

## 2022-12-02 DIAGNOSIS — E039 Hypothyroidism, unspecified: Secondary | ICD-10-CM | POA: Insufficient documentation

## 2022-12-02 NOTE — Progress Notes (Signed)
Primary Care Physician: Karna Dupes, MD Primary Cardiologist: Thomasene Ripple, DO Electrophysiologist: None     Referring Physician: Edd Fabian     Sara Snow is a 82 y.o. female with a history of coronary and aortic artery calcifications by imaging, HTN, CVA, hypothyroidism, HFpEF, anemia, and persistent atrial fibrillation who presents for consultation in the [facility].  Seen by Robin Searing, NP, on 8/7 and noted to be in Afib. She underwent successful DCCV on 8/14. Seen by Edd Fabian, NP, on 9/4, and found to be in atrial flutter. Currently taking Toprol 50 mg daily. Patient is on Eliquis 2.5 mg BID for a CHADS2VASC score of 7.  On evaluation today, she is currently in Afib. She had ERAF after successful cardioversion on 8/14. No missed doses of Eliquis. She feels very tired when in Afib and SOB with activity.   Today, she denies symptoms of palpitations, chest pain, orthopnea, PND, lower extremity edema, dizziness, presyncope, syncope, snoring, daytime somnolence, bleeding, or neurologic sequela. The patient is tolerating medications without difficulties and is otherwise without complaint today.   she has a BMI of Body mass index is 22.63 kg/m.Marland Kitchen Filed Weights   12/02/22 1110  Weight: 58.9 kg    Current Outpatient Medications  Medication Sig Dispense Refill   acetaminophen (TYLENOL) 650 MG CR tablet Take 1,300 mg by mouth in the morning, at noon, and at bedtime.     apixaban (ELIQUIS) 2.5 MG TABS tablet Take 1 tablet (2.5 mg total) by mouth 2 (two) times daily. 60 tablet 1   atorvastatin (LIPITOR) 40 MG tablet Take 40 mg by mouth daily with supper.     Calcium Citrate-Vitamin D 315-5 MG-MCG TABS Take 1 tablet by mouth daily.     Carboxymethylcellulose Sod PF (REFRESH PLUS) 0.5 % SOLN Place 1 drop into both eyes 6 (six) times daily.     folic acid (FOLVITE) 1 MG tablet Take 1 mg by mouth daily.     furosemide (LASIX) 20 MG tablet Take 1  tablet (20 mg total) by mouth 2 (two) times daily for 3 days, THEN 1 tablet (20 mg total) daily for 7 days. (Patient taking differently: Taking 20 mg by mouth daily) 15 tablet 0   gabapentin (NEURONTIN) 100 MG capsule Take 100-300 mg by mouth at bedtime. For restless leg     KLOR-CON 20 MEQ packet Take 20 mEq by mouth daily.     levothyroxine (SYNTHROID) 75 MCG tablet Take 75 mcg by mouth daily.     metoprolol succinate (TOPROL-XL) 50 MG 24 hr tablet Take 1 tablet (50 mg total) by mouth daily. Take with or immediately following a meal. 30 tablet 1   metoprolol tartrate (LOPRESSOR) 25 MG tablet Take 1 tablet (25 mg total) by mouth daily as needed (HEART RATE OVER 120). 30 tablet 1   Multiple Vitamins-Minerals (PRESERVISION AREDS) CAPS Take 1 capsule by mouth in the morning and at bedtime.     pantoprazole (PROTONIX) 40 MG tablet Take 1 tablet (40 mg total) by mouth daily. 30 tablet 1   UNABLE TO FIND Take 750 mg by mouth daily. Bone Restore - Life Extension: take 3 tablets daily     Vitamin D, Ergocalciferol, (DRISDOL) 1.25 MG (50000 UNIT) CAPS capsule Take 50,000 Units by mouth every 7 (seven) days. Sunday     No current facility-administered medications for this encounter.    Atrial Fibrillation Management history:  Previous antiarrhythmic drugs: None Previous cardioversions: 10/26/22 Previous ablations:  None Anticoagulation history: Eliquis 2.5 mg BID   ROS- All systems are reviewed and negative except as per the HPI above.  Physical Exam: BP 138/86   Pulse 92   Ht 5' 3.5" (1.613 m)   Wt 58.9 kg   BMI 22.63 kg/m   GEN: Well nourished, well developed in no acute distress NECK: No JVD; No carotid bruits CARDIAC: Irregularly irregular rate and rhythm, no murmurs, rubs, gallops RESPIRATORY:  Clear to auscultation without rales, wheezing or rhonchi  ABDOMEN: Soft, non-tender, non-distended EXTREMITIES:  No edema; No deformity   EKG today demonstrates  Vent. rate 92 BPM PR  interval * ms QRS duration 112 ms QT/QTcB 410/507 ms P-R-T axes * 60 64 Atrial fibrillation Indeterminate axis Low voltage QRS Incomplete right bundle branch block Cannot rule out Anterior infarct , age undetermined Abnormal ECG When compared with ECG of 16-Nov-2022 14:13, PREVIOUS ECG IS PRESENT  Echo 09/20/22 demonstrated  1. Left ventricular ejection fraction, by estimation, is 65 to 70%. The  left ventricle has normal function. The left ventricle has no regional  wall motion abnormalities. There is mild concentric left ventricular  hypertrophy. Left ventricular diastolic  function could not be evaluated.   2. Right ventricular systolic function is normal. The right ventricular  size is normal. Tricuspid regurgitation signal is inadequate for assessing  PA pressure.   3. Left atrial size was mildly dilated.   4. Moderate pleural effusion in the left lateral region.   5. The mitral valve is normal in structure. Mild mitral valve  regurgitation.   6. The aortic valve is tricuspid. Aortic valve regurgitation is not  visualized. No aortic stenosis is present.   7. The inferior vena cava is normal in size with greater than 50%  respiratory variability, suggesting right atrial pressure of 3 mmHg.   Comparison(s): Echocardiogram done at [facility] on 04/19/21 showed an EF of  60-65%.   ASSESSMENT & PLAN CHA2DS2-VASc Score = 7  The patient's score is based upon: CHF History: 1 HTN History: 1 Diabetes History: 0 Stroke History: 2 Vascular Disease History: 0 Age Score: 2 Gender Score: 1       ASSESSMENT AND PLAN: Persistent Atrial Fibrillation (ICD10:  I48.19) / atrial flutter The patient's CHA2DS2-VASc score is 7, indicating a 11.2% annual risk of stroke.    S/p successful DCCV on 10/26/22 with ERAF.   She is currently in Afib. Discussion of options for rhythm control including AAD and ablation. She could be a candidate for Multaq, Tikosyn, or amiodarone. CrCl estimate 41 mLmin;  no obvious medication contraindication with Tikosyn. Qtc 414 ms with manual calculation. After discussion, she would like to proceed with medication treatment. We went over the potential adverse effects associated with these three AAD options and the necessity for a hospital admission for Tikosyn. We also went over the necessity for another cardioversion (possibly more in the future) in order to maintain normal rhythm. We discussed the monitoring required for all three medications and possible side effects of amiodarone. She does not wish to speak with EP regarding ablation and currently not interested in procedure. She would like to discuss further with husband on medication choices and will call with decision. She will also call insurance for pricing.   Secondary Hypercoagulable State (ICD10:  D68.69) The patient is at significant risk for stroke/thromboembolism based upon her CHA2DS2-VASc Score of 7.  Continue Apixaban (Eliquis).  She is on correct dose of 2.5 mg according to age and weight.   Addendum  12/05/22: Patient called office stating would like to start amiodarone. Will begin amiodarone load 200 mg BID x 1 month with f/u visit in 2-3 weeks.  Patient will call to confirm decision for treatment plan.    [place] Bells, PA-C  Afib Clinic [facility] [address] [place], [place] [address] [phone]

## 2022-12-02 NOTE — Telephone Encounter (Signed)
Patient called stating that she wants to start Amiodarone. Reached out to [place], RN she stated that she will get with the provider on Monday and will send to [facility] for patient.

## 2022-12-05 ENCOUNTER — Telehealth (HOSPITAL_COMMUNITY): Payer: Self-pay | Admitting: *Deleted

## 2022-12-05 MED ORDER — AMIODARONE HCL 200 MG PO TABS: ORAL_TABLET | ORAL | 3 refills | Status: DC

## 2022-12-05 NOTE — Addendum Note (Signed)
Encounter addended by: Eustace Pen, PA-C on: 12/05/2022 9:24 AM  Actions taken: Clinical Note Signed

## 2022-12-05 NOTE — Telephone Encounter (Signed)
Patient prefers to start amiodarone for afib management per recent office visit. Discussed with Landry Mellow PA will start amiodarone 200mg  BID for 30 days then once a day going forward. Pt using mail order for RX -- will call once she has the amiodarone to schedule 2 week  follow up after starting. Pt verbalized understanding.

## 2023-01-03 ENCOUNTER — Ambulatory Visit (HOSPITAL_COMMUNITY)
Admission: RE | Admit: 2023-01-03 | Discharge: 2023-01-03 | Disposition: A | Discharge status: Home / Self Care | Payer: Medicare Other | Source: Ambulatory Visit | Attending: Internal Medicine | Admitting: Internal Medicine

## 2023-01-03 VITALS — BP 136/80 | HR 79 | Ht 63.5 in | Wt 131.8 lb

## 2023-01-03 DIAGNOSIS — I110 Hypertensive heart disease with heart failure: Secondary | ICD-10-CM | POA: Diagnosis not present

## 2023-01-03 DIAGNOSIS — I4892 Unspecified atrial flutter: Secondary | ICD-10-CM | POA: Diagnosis not present

## 2023-01-03 DIAGNOSIS — D649 Anemia, unspecified: Secondary | ICD-10-CM | POA: Diagnosis not present

## 2023-01-03 DIAGNOSIS — I5032 Chronic diastolic (congestive) heart failure: Secondary | ICD-10-CM | POA: Diagnosis present

## 2023-01-03 DIAGNOSIS — E039 Hypothyroidism, unspecified: Secondary | ICD-10-CM | POA: Insufficient documentation

## 2023-01-03 DIAGNOSIS — I2510 Atherosclerotic heart disease of native coronary artery without angina pectoris: Secondary | ICD-10-CM | POA: Insufficient documentation

## 2023-01-03 DIAGNOSIS — Z5181 Encounter for therapeutic drug level monitoring: Secondary | ICD-10-CM

## 2023-01-03 DIAGNOSIS — I4819 Other persistent atrial fibrillation: Secondary | ICD-10-CM | POA: Diagnosis not present

## 2023-01-03 DIAGNOSIS — Z7901 Long term (current) use of anticoagulants: Secondary | ICD-10-CM | POA: Diagnosis not present

## 2023-01-03 DIAGNOSIS — D6869 Other thrombophilia: Secondary | ICD-10-CM | POA: Diagnosis not present

## 2023-01-03 DIAGNOSIS — Z79899 Other long term (current) drug therapy: Secondary | ICD-10-CM | POA: Diagnosis not present

## 2023-01-03 DIAGNOSIS — R9431 Abnormal electrocardiogram [ECG] [EKG]: Secondary | ICD-10-CM | POA: Insufficient documentation

## 2023-01-03 DIAGNOSIS — Z8673 Personal history of transient ischemic attack (TIA), and cerebral infarction without residual deficits: Secondary | ICD-10-CM | POA: Diagnosis not present

## 2023-01-03 LAB — BASIC METABOLIC PANEL
Anion gap: 10 (ref 5–15)
BUN: 23 mg/dL (ref 8–23)
CO2: 24 mmol/L (ref 22–32)
Calcium: 9.3 mg/dL (ref 8.9–10.3)
Chloride: 94 mmol/L — ABNORMAL LOW (ref 98–111)
Creatinine, Ser: 0.96 mg/dL (ref 0.44–1.00)
GFR, Estimated: 60 mL/min — ABNORMAL LOW (ref >60)
Glucose, Bld: 95 mg/dL (ref 70–99)
Potassium: 5.3 mmol/L — ABNORMAL HIGH (ref 3.5–5.1)
Sodium: 128 mmol/L — ABNORMAL LOW (ref 135–145)

## 2023-01-03 LAB — CBC
HCT: 32.5 % — ABNORMAL LOW (ref 36.0–46.0)
Hemoglobin: 10.2 g/dL — ABNORMAL LOW (ref 12.0–15.0)
MCH: 26.2 pg (ref 26.0–34.0)
MCHC: 31.4 g/dL (ref 30.0–36.0)
MCV: 83.5 fL (ref 80.0–100.0)
Platelets: 206 10*3/uL (ref 150–400)
RBC: 3.89 MIL/uL (ref 3.87–5.11)
RDW: 15.5 % (ref 11.5–15.5)
WBC: 6.3 10*3/uL (ref 4.0–10.5)
nRBC: 0 % (ref 0.0–0.2)

## 2023-01-03 NOTE — Progress Notes (Signed)
Primary Care Physician: Karna Dupes, MD Primary Cardiologist: Thomasene Ripple, DO Electrophysiologist: None     Referring Physician: Edd Fabian     Sara Snow is a 82 y.o. female with a history of coronary and aortic artery calcifications by imaging, HTN, CVA, hypothyroidism, HFpEF, anemia, and persistent atrial fibrillation who presents for consultation in the [facility].  Seen by Robin Searing, NP, on 8/7 and noted to be in Afib. She underwent successful DCCV on 8/14. Seen by Edd Fabian, NP, on 9/4, and found to be in atrial flutter. Currently taking Toprol 50 mg daily. Patient is on Eliquis 2.5 mg BID for a CHADS2VASC score of 7.  On evaluation today, she is currently in Afib. She had ERAF after successful cardioversion on 8/14. No missed doses of Eliquis. She feels very tired when in Afib and SOB with activity.   On follow up 01/03/23, she is currently in Afib. After last OV, she called back 9/23 and determine she would like to try amiodarone for rhythm control. She is currently taking amiodarone 200 mg BID. No missed doses of Eliquis 2.5 mg BID. Husband notes from paper showing daily weights that she is up about 5 lbs in one week. She notes some more SOB recently than in the past few weeks.   Today, she denies symptoms of palpitations, chest pain, orthopnea, PND, lower extremity edema, dizziness, presyncope, syncope, snoring, daytime somnolence, bleeding, or neurologic sequela. The patient is tolerating medications without difficulties and is otherwise without complaint today.   she has a BMI of Body mass index is 22.98 kg/m.Marland Kitchen Filed Weights   01/03/23 1355  Weight: 59.8 kg     Current Outpatient Medications  Medication Sig Dispense Refill   acetaminophen (TYLENOL) 650 MG CR tablet Take 1,300 mg by mouth in the morning, at noon, and at bedtime.     amiodarone (PACERONE) 200 MG tablet Take 1 tablet (200 mg total) by mouth 2 (two) times  daily for 30 days, THEN 1 tablet (200 mg total) daily. 90 tablet 3   apixaban (ELIQUIS) 2.5 MG TABS tablet Take 1 tablet (2.5 mg total) by mouth 2 (two) times daily. 60 tablet 1   atorvastatin (LIPITOR) 40 MG tablet Take 40 mg by mouth daily with supper.     Calcium Citrate-Vitamin D 315-5 MG-MCG TABS Take 1 tablet by mouth daily.     Carboxymethylcellulose Sod PF (REFRESH PLUS) 0.5 % SOLN Place 1 drop into both eyes 6 (six) times daily.     folic acid (FOLVITE) 1 MG tablet Take 1 mg by mouth daily.     furosemide (LASIX) 20 MG tablet Take 1 tablet (20 mg total) by mouth 2 (two) times daily for 3 days, THEN 1 tablet (20 mg total) daily for 7 days. (Patient taking differently: Taking 20 mg by mouth daily) 15 tablet 0   gabapentin (NEURONTIN) 100 MG capsule Take 100-300 mg by mouth at bedtime. For restless leg     KLOR-CON 20 MEQ packet Take 20 mEq by mouth daily.     levothyroxine (SYNTHROID) 75 MCG tablet Take 75 mcg by mouth daily.     metoprolol succinate (TOPROL-XL) 50 MG 24 hr tablet Take 1 tablet (50 mg total) by mouth daily. Take with or immediately following a meal. 30 tablet 1   metoprolol tartrate (LOPRESSOR) 25 MG tablet Take 1 tablet (25 mg total) by mouth daily as needed (HEART RATE OVER 120). 30 tablet 1   Multiple Vitamins-Minerals (  PRESERVISION AREDS) CAPS Take 1 capsule by mouth in the morning and at bedtime.     pantoprazole (PROTONIX) 40 MG tablet Take 1 tablet (40 mg total) by mouth daily. 30 tablet 1   UNABLE TO FIND Take 750 mg by mouth daily. Bone Restore - Life Extension: take 3 tablets daily     Vitamin D, Ergocalciferol, (DRISDOL) 1.25 MG (50000 UNIT) CAPS capsule Take 50,000 Units by mouth every 7 (seven) days. Sunday     No current facility-administered medications for this encounter.    Atrial Fibrillation Management history:  Previous antiarrhythmic drugs: amiodarone Previous cardioversions: 10/26/22 Previous ablations: None Anticoagulation history: Eliquis 2.5 mg  BID   ROS- All systems are reviewed and negative except as per the HPI above.  Physical Exam: BP 136/80   Pulse 79   Ht 5' 3.5" (1.613 m)   Wt 59.8 kg   BMI 22.98 kg/m   GEN- The patient is well appearing, alert and oriented x 3 today.   Neck - no JVD or carotid bruit noted Lungs- Clear to ausculation bilaterally, normal work of breathing Heart- Irregular rate and rhythm, no murmurs, rubs or gallops, PMI not laterally displaced Extremities- no clubbing, cyanosis, or edema Skin - no rash or ecchymosis noted   EKG today demonstrates  Vent. rate 79 BPM PR interval * ms QRS duration 126 ms QT/QTcB 438/502 ms P-R-T axes * 142 -27 Atrial fibrillation Right axis deviation Non-specific intra-ventricular conduction block Cannot rule out Septal infarct , age undetermined T wave abnormality, consider inferior ischemia Abnormal ECG When compared with ECG of 02-Dec-2022 11:23, PREVIOUS ECG IS PRESENT  Echo 09/20/22 demonstrated  1. Left ventricular ejection fraction, by estimation, is 65 to 70%. The  left ventricle has normal function. The left ventricle has no regional  wall motion abnormalities. There is mild concentric left ventricular  hypertrophy. Left ventricular diastolic  function could not be evaluated.   2. Right ventricular systolic function is normal. The right ventricular  size is normal. Tricuspid regurgitation signal is inadequate for assessing  PA pressure.   3. Left atrial size was mildly dilated.   4. Moderate pleural effusion in the left lateral region.   5. The mitral valve is normal in structure. Mild mitral valve  regurgitation.   6. The aortic valve is tricuspid. Aortic valve regurgitation is not  visualized. No aortic stenosis is present.   7. The inferior vena cava is normal in size with greater than 50%  respiratory variability, suggesting right atrial pressure of 3 mmHg.   Comparison(s): Echocardiogram done at [facility] on 04/19/21 showed an EF of   60-65%.   ASSESSMENT & PLAN CHA2DS2-VASc Score = 7  The patient's score is based upon: CHF History: 1 HTN History: 1 Diabetes History: 0 Stroke History: 2 Vascular Disease History: 0 Age Score: 2 Gender Score: 1       ASSESSMENT AND PLAN: Persistent Atrial Fibrillation (ICD10:  I48.19) / atrial flutter The patient's CHA2DS2-VASc score is 7, indicating a 11.2% annual risk of stroke.    S/p successful DCCV on 10/26/22 with ERAF.   She is currently in Afib. She is loading on amiodarone 200 mg BID since 10/6. She would like to proceed with scheduling cardioversion after 1 month load of amiodarone.   After discussion of plan to perform repeat cardioversion after amiodarone load, patient is in agreement to proceed with cardioversion. Labs today. She will temporarily increase lasix to 40 mg daily for 3 days then return to lasix 20  mg daily.   Informed Consent   Shared Decision Making/Informed Consent The risks (stroke, cardiac arrhythmias rarely resulting in the need for a temporary or permanent pacemaker, skin irritation or burns and complications associated with conscious sedation including aspiration, arrhythmia, respiratory failure and death), benefits (restoration of normal sinus rhythm) and alternatives of a direct current cardioversion were explained in detail to Ms. Kronenberger and she agrees to proceed.       Secondary Hypercoagulable State (ICD10:  D68.69) The patient is at significant risk for stroke/thromboembolism based upon her CHA2DS2-VASc Score of 7.  Continue Apixaban (Eliquis).  She is on correct dose of 2.5 mg according to age and weight.   F/u 2 weeks after DCCV.    Lake Bells, PA-C  [facility] [address] [place], [place] [address] [phone]

## 2023-01-03 NOTE — H&P (View-Only) (Signed)
Primary Care Physician: Karna Dupes, MD Primary Cardiologist: Thomasene Ripple, DO Electrophysiologist: None     Referring Physician: Edd Fabian     Sara Snow is a 82 y.o. female with a history of coronary and aortic artery calcifications by imaging, HTN, CVA, hypothyroidism, HFpEF, anemia, and persistent atrial fibrillation who presents for consultation in the [facility].  Seen by Robin Searing, NP, on 8/7 and noted to be in Afib. She underwent successful DCCV on 8/14. Seen by Edd Fabian, NP, on 9/4, and found to be in atrial flutter. Currently taking Toprol 50 mg daily. Patient is on Eliquis 2.5 mg BID for a CHADS2VASC score of 7.  On evaluation today, she is currently in Afib. She had ERAF after successful cardioversion on 8/14. No missed doses of Eliquis. She feels very tired when in Afib and SOB with activity.   On follow up 01/03/23, she is currently in Afib. After last OV, she called back 9/23 and determine she would like to try amiodarone for rhythm control. She is currently taking amiodarone 200 mg BID. No missed doses of Eliquis 2.5 mg BID. Husband notes from paper showing daily weights that she is up about 5 lbs in one week. She notes some more SOB recently than in the past few weeks.   Today, she denies symptoms of palpitations, chest pain, orthopnea, PND, lower extremity edema, dizziness, presyncope, syncope, snoring, daytime somnolence, bleeding, or neurologic sequela. The patient is tolerating medications without difficulties and is otherwise without complaint today.   she has a BMI of Body mass index is 22.98 kg/m.Marland Kitchen Filed Weights   01/03/23 1355  Weight: 59.8 kg     Current Outpatient Medications  Medication Sig Dispense Refill   acetaminophen (TYLENOL) 650 MG CR tablet Take 1,300 mg by mouth in the morning, at noon, and at bedtime.     amiodarone (PACERONE) 200 MG tablet Take 1 tablet (200 mg total) by mouth 2 (two) times  daily for 30 days, THEN 1 tablet (200 mg total) daily. 90 tablet 3   apixaban (ELIQUIS) 2.5 MG TABS tablet Take 1 tablet (2.5 mg total) by mouth 2 (two) times daily. 60 tablet 1   atorvastatin (LIPITOR) 40 MG tablet Take 40 mg by mouth daily with supper.     Calcium Citrate-Vitamin D 315-5 MG-MCG TABS Take 1 tablet by mouth daily.     Carboxymethylcellulose Sod PF (REFRESH PLUS) 0.5 % SOLN Place 1 drop into both eyes 6 (six) times daily.     folic acid (FOLVITE) 1 MG tablet Take 1 mg by mouth daily.     furosemide (LASIX) 20 MG tablet Take 1 tablet (20 mg total) by mouth 2 (two) times daily for 3 days, THEN 1 tablet (20 mg total) daily for 7 days. (Patient taking differently: Taking 20 mg by mouth daily) 15 tablet 0   gabapentin (NEURONTIN) 100 MG capsule Take 100-300 mg by mouth at bedtime. For restless leg     KLOR-CON 20 MEQ packet Take 20 mEq by mouth daily.     levothyroxine (SYNTHROID) 75 MCG tablet Take 75 mcg by mouth daily.     metoprolol succinate (TOPROL-XL) 50 MG 24 hr tablet Take 1 tablet (50 mg total) by mouth daily. Take with or immediately following a meal. 30 tablet 1   metoprolol tartrate (LOPRESSOR) 25 MG tablet Take 1 tablet (25 mg total) by mouth daily as needed (HEART RATE OVER 120). 30 tablet 1   Multiple Vitamins-Minerals (  PRESERVISION AREDS) CAPS Take 1 capsule by mouth in the morning and at bedtime.     pantoprazole (PROTONIX) 40 MG tablet Take 1 tablet (40 mg total) by mouth daily. 30 tablet 1   UNABLE TO FIND Take 750 mg by mouth daily. Bone Restore - Life Extension: take 3 tablets daily     Vitamin D, Ergocalciferol, (DRISDOL) 1.25 MG (50000 UNIT) CAPS capsule Take 50,000 Units by mouth every 7 (seven) days. Sunday     No current facility-administered medications for this encounter.    Atrial Fibrillation Management history:  Previous antiarrhythmic drugs: amiodarone Previous cardioversions: 10/26/22 Previous ablations: None Anticoagulation history: Eliquis 2.5 mg  BID   ROS- All systems are reviewed and negative except as per the HPI above.  Physical Exam: BP 136/80   Pulse 79   Ht 5' 3.5" (1.613 m)   Wt 59.8 kg   BMI 22.98 kg/m   GEN- The patient is well appearing, alert and oriented x 3 today.   Neck - no JVD or carotid bruit noted Lungs- Clear to ausculation bilaterally, normal work of breathing Heart- Irregular rate and rhythm, no murmurs, rubs or gallops, PMI not laterally displaced Extremities- no clubbing, cyanosis, or edema Skin - no rash or ecchymosis noted   EKG today demonstrates  Vent. rate 79 BPM PR interval * ms QRS duration 126 ms QT/QTcB 438/502 ms P-R-T axes * 142 -27 Atrial fibrillation Right axis deviation Non-specific intra-ventricular conduction block Cannot rule out Septal infarct , age undetermined T wave abnormality, consider inferior ischemia Abnormal ECG When compared with ECG of 02-Dec-2022 11:23, PREVIOUS ECG IS PRESENT  Echo 09/20/22 demonstrated  1. Left ventricular ejection fraction, by estimation, is 65 to 70%. The  left ventricle has normal function. The left ventricle has no regional  wall motion abnormalities. There is mild concentric left ventricular  hypertrophy. Left ventricular diastolic  function could not be evaluated.   2. Right ventricular systolic function is normal. The right ventricular  size is normal. Tricuspid regurgitation signal is inadequate for assessing  PA pressure.   3. Left atrial size was mildly dilated.   4. Moderate pleural effusion in the left lateral region.   5. The mitral valve is normal in structure. Mild mitral valve  regurgitation.   6. The aortic valve is tricuspid. Aortic valve regurgitation is not  visualized. No aortic stenosis is present.   7. The inferior vena cava is normal in size with greater than 50%  respiratory variability, suggesting right atrial pressure of 3 mmHg.   Comparison(s): Echocardiogram done at [facility] on 04/19/21 showed an EF of   60-65%.   ASSESSMENT & PLAN CHA2DS2-VASc Score = 7  The patient's score is based upon: CHF History: 1 HTN History: 1 Diabetes History: 0 Stroke History: 2 Vascular Disease History: 0 Age Score: 2 Gender Score: 1       ASSESSMENT AND PLAN: Persistent Atrial Fibrillation (ICD10:  I48.19) / atrial flutter The patient's CHA2DS2-VASc score is 7, indicating a 11.2% annual risk of stroke.    S/p successful DCCV on 10/26/22 with ERAF.   She is currently in Afib. She is loading on amiodarone 200 mg BID since 10/6. She would like to proceed with scheduling cardioversion after 1 month load of amiodarone.   After discussion of plan to perform repeat cardioversion after amiodarone load, patient is in agreement to proceed with cardioversion. Labs today. She will temporarily increase lasix to 40 mg daily for 3 days then return to lasix 20  mg daily.   Informed Consent   Shared Decision Making/Informed Consent The risks (stroke, cardiac arrhythmias rarely resulting in the need for a temporary or permanent pacemaker, skin irritation or burns and complications associated with conscious sedation including aspiration, arrhythmia, respiratory failure and death), benefits (restoration of normal sinus rhythm) and alternatives of a direct current cardioversion were explained in detail to Ms. Kronenberger and she agrees to proceed.       Secondary Hypercoagulable State (ICD10:  D68.69) The patient is at significant risk for stroke/thromboembolism based upon her CHA2DS2-VASc Score of 7.  Continue Apixaban (Eliquis).  She is on correct dose of 2.5 mg according to age and weight.   F/u 2 weeks after DCCV.    Lake Bells, PA-C  [facility] [address] [place], [place] [address] [phone]

## 2023-01-03 NOTE — Patient Instructions (Signed)
Cardioversion scheduled for: Thursday, November 7th   - Arrive at the [facility] and go to admitting at 830am   - Do not eat or drink anything after midnight the night prior to your procedure.   - Take all your morning medication (except diabetic medications) with a sip of water prior to arrival.  - You will not be able to drive home after your procedure.    - Do NOT miss any doses of your blood thinner - if you should miss a dose please notify our office immediately.   - If you feel as if you go back into normal rhythm prior to scheduled cardioversion, please notify our office immediately.   If your procedure is canceled in the cardioversion suite you will be charged a cancellation fee.

## 2023-01-10 ENCOUNTER — Telehealth (HOSPITAL_COMMUNITY): Payer: Self-pay | Admitting: *Deleted

## 2023-01-10 MED ORDER — FUROSEMIDE 20 MG PO TABS: 20.0000 mg | ORAL_TABLET | Freq: Every day | ORAL | 6 refills | Status: AC

## 2023-01-10 NOTE — Telephone Encounter (Signed)
Patient husband called in stating patient had good response to lasix 40mg  daily for 3 days then when returned to normal dosing of lasix her weight is back up about 4-5lbs and more short of breath. Discussed with Jorja Loa PA will increase lasix to 40mg  daily for 3 days then can use 20mg  daily with 20mg  PRN with wt gain/swelling. Pt husband notified.

## 2023-01-18 NOTE — Progress Notes (Signed)
Unable to reach patient about procedure, but was able to leave a detailed message. Stated that the patient needed to arrive at the hospital at 0800 , remain NPO after 0000, needs to have a ride home and a responsible adult to stay with them for 24 hours after the procedure. Instructed the patient to call back if they had any questions.

## 2023-01-18 NOTE — Progress Notes (Signed)
Pt called back and instructed them to come at 0800 and to be NPO after 0000. Confirmed no missed doses of AC and instructed to take in AM with a small sip of water.   Confirmed that pt will have a ride home and someone to stay with them for 24 hours after the procedure.

## 2023-01-19 ENCOUNTER — Ambulatory Visit (HOSPITAL_COMMUNITY): Payer: Medicare Other | Admitting: Anesthesiology

## 2023-01-19 ENCOUNTER — Other Ambulatory Visit: Payer: Self-pay

## 2023-01-19 ENCOUNTER — Ambulatory Visit (HOSPITAL_COMMUNITY)
Admission: RE | Admit: 2023-01-19 | Discharge: 2023-01-19 | Disposition: A | Discharge status: Home / Self Care | Payer: Medicare Other | Source: Ambulatory Visit | Attending: Cardiology | Admitting: Cardiology

## 2023-01-19 ENCOUNTER — Ambulatory Visit (HOSPITAL_BASED_OUTPATIENT_CLINIC_OR_DEPARTMENT_OTHER): Payer: Medicare Other | Admitting: Anesthesiology

## 2023-01-19 ENCOUNTER — Encounter (HOSPITAL_COMMUNITY)
Admission: RE | Disposition: A | Discharge status: Home / Self Care | Payer: Self-pay | Source: Ambulatory Visit | Attending: Cardiology

## 2023-01-19 DIAGNOSIS — I5030 Unspecified diastolic (congestive) heart failure: Secondary | ICD-10-CM | POA: Insufficient documentation

## 2023-01-19 DIAGNOSIS — I4891 Unspecified atrial fibrillation: Secondary | ICD-10-CM | POA: Diagnosis not present

## 2023-01-19 DIAGNOSIS — K219 Gastro-esophageal reflux disease without esophagitis: Secondary | ICD-10-CM | POA: Diagnosis not present

## 2023-01-19 DIAGNOSIS — Z7901 Long term (current) use of anticoagulants: Secondary | ICD-10-CM | POA: Insufficient documentation

## 2023-01-19 DIAGNOSIS — Z8673 Personal history of transient ischemic attack (TIA), and cerebral infarction without residual deficits: Secondary | ICD-10-CM | POA: Insufficient documentation

## 2023-01-19 DIAGNOSIS — I4892 Unspecified atrial flutter: Secondary | ICD-10-CM | POA: Insufficient documentation

## 2023-01-19 DIAGNOSIS — I110 Hypertensive heart disease with heart failure: Secondary | ICD-10-CM | POA: Diagnosis not present

## 2023-01-19 DIAGNOSIS — I4819 Other persistent atrial fibrillation: Secondary | ICD-10-CM | POA: Diagnosis present

## 2023-01-19 DIAGNOSIS — Z79899 Other long term (current) drug therapy: Secondary | ICD-10-CM | POA: Diagnosis not present

## 2023-01-19 HISTORY — PX: CARDIOVERSION: SHX1299

## 2023-01-19 LAB — POCT I-STAT, CHEM 8
BUN: 23 mg/dL (ref 8–23)
Calcium, Ion: 1.15 mmol/L (ref 1.15–1.40)
Chloride: 88 mmol/L — ABNORMAL LOW (ref 98–111)
Creatinine, Ser: 1 mg/dL (ref 0.44–1.00)
Glucose, Bld: 89 mg/dL (ref 70–99)
HCT: 34 % — ABNORMAL LOW (ref 36.0–46.0)
Hemoglobin: 11.6 g/dL — ABNORMAL LOW (ref 12.0–15.0)
Potassium: 3.9 mmol/L (ref 3.5–5.1)
Sodium: 124 mmol/L — ABNORMAL LOW (ref 135–145)
TCO2: 23 mmol/L (ref 22–32)

## 2023-01-19 SURGERY — CARDIOVERSION
Anesthesia: General

## 2023-01-19 MED ORDER — DEXTROSE-SODIUM CHLORIDE 5-0.45 % IV SOLN: INTRAVENOUS | Status: DC

## 2023-01-19 MED ORDER — PROPOFOL 10 MG/ML IV BOLUS
INTRAVENOUS | Status: DC | PRN
  Administered 2023-01-19: 20 mg via INTRAVENOUS
  Administered 2023-01-19: 50 mg via INTRAVENOUS

## 2023-01-19 MED ORDER — METOPROLOL SUCCINATE ER 50 MG PO TB24: 50.0000 mg | ORAL_TABLET | Freq: Every day | ORAL | Status: DC

## 2023-01-19 MED ORDER — LIDOCAINE 2% (20 MG/ML) 5 ML SYRINGE
INTRAMUSCULAR | Status: DC | PRN
  Administered 2023-01-19: 50 mg via INTRAVENOUS

## 2023-01-19 SURGICAL SUPPLY — 1 items: PAD DEFIB RADIO PHYSIO CONN (PAD) ×1 IMPLANT

## 2023-01-19 NOTE — Interval H&P Note (Signed)
History and Physical Interval Note:  01/19/2023 8:22 AM  Sara Snow  has presented today for surgery, with the diagnosis of afib.  The various methods of treatment have been discussed with the patient and family. After consideration of risks, benefits and other options for treatment, the patient has consented to  Procedure(s): CARDIOVERSION (N/A) as a surgical intervention.  The patient's history has been reviewed, patient examined, no change in status, stable for surgery.  I have reviewed the patient's chart and labs.  Questions were answered to the patient's satisfaction.    Has been taking her Eliquis for more than 3 weeks without interruption.   Tessa Lerner, DO, [facility] [place]  [facility]  [address] [place], [place] [address]

## 2023-01-19 NOTE — Anesthesia Postprocedure Evaluation (Signed)
Anesthesia Post Note  Patient: Sara Snow  Procedure(s) Performed: CARDIOVERSION     Patient location during evaluation: Cath Lab Anesthesia Type: General Level of consciousness: awake and alert Pain management: pain level controlled Vital Signs Assessment: post-procedure vital signs reviewed and stable Respiratory status: spontaneous breathing, nonlabored ventilation, respiratory function stable and patient connected to nasal cannula oxygen Cardiovascular status: blood pressure returned to baseline and stable Postop Assessment: no apparent nausea or vomiting Anesthetic complications: no   No notable events documented.  Last Vitals:  Vitals:   01/19/23 1055 01/19/23 1100  BP: (!) 153/91 (!) 150/91  Pulse:  (!) 59  Resp: (!) 21 (!) 21  Temp:    SpO2:  100%    Last Pain:  Vitals:   01/19/23 1015  TempSrc: Temporal  PainSc: 0-No pain                 Collene Schlichter

## 2023-01-19 NOTE — Anesthesia Preprocedure Evaluation (Signed)
Anesthesia Evaluation  Patient identified by MRN, date of birth, ID band Patient awake    Reviewed: Allergy & Precautions, NPO status , Patient's Chart, lab work & pertinent test results, reviewed documented beta blocker date and time   Airway Mallampati: II  TM Distance: >3 FB Neck ROM: Full    Dental  (+) Teeth Intact, Dental Advisory Given   Pulmonary neg pulmonary ROS   Pulmonary exam normal breath sounds clear to auscultation       Cardiovascular hypertension, Pt. on medications and Pt. on home beta blockers +CHF  + dysrhythmias Atrial Fibrillation and Supra Ventricular Tachycardia  Rhythm:Irregular Rate:Abnormal     Neuro/Psych CVA    GI/Hepatic Neg liver ROS,GERD  Medicated,,  Endo/Other  Hypothyroidism    Renal/GU negative Renal ROS     Musculoskeletal  (+) Arthritis , Rheumatoid disorders,    Abdominal   Peds  Hematology negative hematology ROS (+)   Anesthesia Other Findings Day of surgery medications reviewed with the patient.  Reproductive/Obstetrics                             Anesthesia Physical Anesthesia Plan  ASA: 3  Anesthesia Plan: General   Post-op Pain Management: Minimal or no pain anticipated   Induction: Intravenous  PONV Risk Score and Plan: 3 and TIVA and Treatment may vary due to age or medical condition  Airway Management Planned: Natural Airway and Simple Face Mask  Additional Equipment:   Intra-op Plan:   Post-operative Plan:   Informed Consent: I have reviewed the patients History and Physical, chart, labs and discussed the procedure including the risks, benefits and alternatives for the proposed anesthesia with the patient or authorized representative who has indicated his/her understanding and acceptance.     Dental advisory given  Plan Discussed with: CRNA  Anesthesia Plan Comments:        Anesthesia Quick Evaluation

## 2023-01-19 NOTE — Anesthesia Procedure Notes (Signed)
Date/Time: 01/19/2023 10:13 AM  Performed by: Stanton Kidney, CRNAOxygen Delivery Method: Simple face mask

## 2023-01-19 NOTE — CV Procedure (Signed)
   DIRECT CURRENT CARDIOVERSION  NAME:  Sara Snow    MRN: [id number] DOB:  03/31/1940    ADMIT DATE: 01/19/2023  Indication:  Symptomatic atrial fibrillation  Procedure Note:  The patient signed informed consent.  They have had had therapeutic anticoagulation with Eliquis greater than 3 weeks.  Anesthesia was administered by Dr. Desmond Lope.  Adequate airway was maintained throughout and vital followed per protocol.  They were cardioverted x 1 with 200J of biphasic synchronized energy.  They converted to NSR.  There were no apparent complications.  The patient had normal neuro status and respiratory status post procedure with vitals stable as recorded elsewhere.    Follow up: They will continue on current medical therapy and follow up with cardiology as scheduled.  Husband updated post procedures.   Tessa Lerner, DO, [facility] [place]  [facility]  [address] [place], [place] [address] [phone] 10:10 AM

## 2023-01-19 NOTE — Transfer of Care (Signed)
Immediate Anesthesia Transfer of Care Note  Patient: Sara Snow  Procedure(s) Performed: CARDIOVERSION  Patient Location: PACU  Anesthesia Type:General  Level of Consciousness: awake and drowsy  Airway & Oxygen Therapy: Patient Spontanous Breathing and Patient connected to face mask oxygen  Post-op Assessment: Report given to RN and Post -op Vital signs reviewed and stable  Post vital signs: Reviewed and stable  Last Vitals:  Vitals Value Taken Time  BP    Temp    Pulse    Resp    SpO2      Last Pain:  Vitals:   01/19/23 0810  TempSrc: Temporal         Complications: No notable events documented.

## 2023-01-20 ENCOUNTER — Telehealth (HOSPITAL_COMMUNITY): Payer: Self-pay | Admitting: *Deleted

## 2023-01-20 ENCOUNTER — Encounter (HOSPITAL_COMMUNITY): Payer: Self-pay | Admitting: Cardiology

## 2023-01-20 NOTE — Telephone Encounter (Signed)
Patient called in stating she is extremely short of breath today. Her weight is up 5lbs. DCCV yesterday. Pt is audibly short of breath over the phone. Per Jorja Loa PA will increase lasix to 40mg  BID through Monday then call with update of response. Pt to take extra of potassium as well. Pt verbalized agreement. HR 62.

## 2023-01-23 NOTE — Telephone Encounter (Signed)
Pt feeling much better after increasing lasix over weekend. States her weight is 128 which is still above her baseline weight but denies swelling/abdominal bloating. Discussed with Jorja Loa PA will continue lasix 40mg  daily until seen for follow up later this week. Pt in agreement.

## 2023-01-26 ENCOUNTER — Other Ambulatory Visit: Payer: Self-pay

## 2023-01-26 ENCOUNTER — Ambulatory Visit (HOSPITAL_COMMUNITY)
Admission: RE | Admit: 2023-01-26 | Discharge: 2023-01-26 | Disposition: A | Discharge status: Home / Self Care | Payer: Medicare Other | Source: Ambulatory Visit | Attending: Internal Medicine | Admitting: Internal Medicine

## 2023-01-26 VITALS — BP 130/66 | HR 73 | Ht 63.5 in | Wt 125.8 lb

## 2023-01-26 DIAGNOSIS — I4819 Other persistent atrial fibrillation: Secondary | ICD-10-CM | POA: Insufficient documentation

## 2023-01-26 DIAGNOSIS — I509 Heart failure, unspecified: Secondary | ICD-10-CM | POA: Insufficient documentation

## 2023-01-26 DIAGNOSIS — Z5181 Encounter for therapeutic drug level monitoring: Secondary | ICD-10-CM | POA: Diagnosis not present

## 2023-01-26 DIAGNOSIS — I110 Hypertensive heart disease with heart failure: Secondary | ICD-10-CM | POA: Diagnosis not present

## 2023-01-26 DIAGNOSIS — Z79899 Other long term (current) drug therapy: Secondary | ICD-10-CM | POA: Diagnosis not present

## 2023-01-26 DIAGNOSIS — E039 Hypothyroidism, unspecified: Secondary | ICD-10-CM | POA: Diagnosis not present

## 2023-01-26 DIAGNOSIS — D6869 Other thrombophilia: Secondary | ICD-10-CM | POA: Diagnosis not present

## 2023-01-26 DIAGNOSIS — I4892 Unspecified atrial flutter: Secondary | ICD-10-CM | POA: Insufficient documentation

## 2023-01-26 DIAGNOSIS — Z7901 Long term (current) use of anticoagulants: Secondary | ICD-10-CM | POA: Insufficient documentation

## 2023-01-26 DIAGNOSIS — Z8673 Personal history of transient ischemic attack (TIA), and cerebral infarction without residual deficits: Secondary | ICD-10-CM | POA: Diagnosis not present

## 2023-01-26 LAB — BASIC METABOLIC PANEL
Anion gap: 9 (ref 5–15)
BUN: 10 mg/dL (ref 8–23)
CO2: 26 mmol/L (ref 22–32)
Calcium: 9.3 mg/dL (ref 8.9–10.3)
Chloride: 97 mmol/L — ABNORMAL LOW (ref 98–111)
Creatinine, Ser: 1.04 mg/dL — ABNORMAL HIGH (ref 0.44–1.00)
GFR, Estimated: 54 mL/min — ABNORMAL LOW (ref >60)
Glucose, Bld: 105 mg/dL — ABNORMAL HIGH (ref 70–99)
Potassium: 4.6 mmol/L (ref 3.5–5.1)
Sodium: 132 mmol/L — ABNORMAL LOW (ref 135–145)

## 2023-01-26 LAB — CBC
HCT: 37.1 % (ref 36.0–46.0)
Hemoglobin: 11.6 g/dL — ABNORMAL LOW (ref 12.0–15.0)
MCH: 25.7 pg — ABNORMAL LOW (ref 26.0–34.0)
MCHC: 31.3 g/dL (ref 30.0–36.0)
MCV: 82.3 fL (ref 80.0–100.0)
Platelets: 202 10*3/uL (ref 150–400)
RBC: 4.51 MIL/uL (ref 3.87–5.11)
RDW: 15.9 % — ABNORMAL HIGH (ref 11.5–15.5)
WBC: 5.1 10*3/uL (ref 4.0–10.5)
nRBC: 0 % (ref 0.0–0.2)

## 2023-01-26 MED ORDER — METOPROLOL SUCCINATE ER 25 MG PO TB24: 12.5000 mg | ORAL_TABLET | Freq: Every day | ORAL | 1 refills | Status: DC

## 2023-01-26 NOTE — Patient Instructions (Addendum)
Please continue amiodarone 200 mg daily.  Please take lasix 20 mg once daily.  Please take potassium 20 meq once daily.  Please take metoprolol 12.5 mg once at bedtime.   Please call clinic if HR below 60 beats per minute and patient is symptomatic.

## 2023-01-26 NOTE — Progress Notes (Signed)
Primary Care Physician: Karna Dupes, MD Primary Cardiologist: Thomasene Ripple, DO Electrophysiologist: None     Referring Physician: Edd Fabian     Sara Snow is a 82 y.o. female with a history of coronary and aortic artery calcifications by imaging, HTN, CVA, hypothyroidism, HFpEF, anemia, and persistent atrial fibrillation who presents for consultation in the [facility].  Seen by Robin Searing, NP, on 8/7 and noted to be in Afib. She underwent successful DCCV on 8/14. Seen by Edd Fabian, NP, on 9/4, and found to be in atrial flutter. Currently taking Toprol 50 mg daily. Patient is on Eliquis 2.5 mg BID for a CHADS2VASC score of 7.  On evaluation today, she is currently in Afib. She had ERAF after successful cardioversion on 8/14. No missed doses of Eliquis. She feels very tired when in Afib and SOB with activity.   On follow up 01/03/23, she is currently in Afib. After last OV, she called back 9/23 and determine she would like to try amiodarone for rhythm control. She is currently taking amiodarone 200 mg BID. No missed doses of Eliquis 2.5 mg BID. Husband notes from paper showing daily weights that she is up about 5 lbs in one week. She notes some more SOB recently than in the past few weeks.   On follow up 01/26/23, she is currently in NSR. She is s/p successful DCCV on 01/19/23. She is currently taking amiodarone 200 mg daily. She was placed on increased lasix 40 mg BID and additional potassium 11/8-11 for increased weight post cardioversion. She transitioned to lasix 40 mg daily since temporary increase. She is doing well overall. Husband has provided me a detailed log of her weights and discontinuation of metoprolol for HR < 60 bpm. She feels much better since increased lasix. She has lost ~15 lbs since post cardioversion.   Today, she denies symptoms of palpitations, chest pain, orthopnea, PND, lower extremity edema, dizziness, presyncope,  syncope, snoring, daytime somnolence, bleeding, or neurologic sequela. The patient is tolerating medications without difficulties and is otherwise without complaint today.   she has a BMI of Body mass index is 21.93 kg/m.Marland Kitchen Filed Weights   01/26/23 1327  Weight: 57.1 kg     Current Outpatient Medications  Medication Sig Dispense Refill   acetaminophen (TYLENOL) 650 MG CR tablet Take 1,300 mg by mouth in the morning, at noon, and at bedtime.     amiodarone (PACERONE) 200 MG tablet Take 1 tablet (200 mg total) by mouth 2 (two) times daily for 30 days, THEN 1 tablet (200 mg total) daily. (Patient taking differently: 1 tablet (200 mg total) daily.) 90 tablet 3   apixaban (ELIQUIS) 2.5 MG TABS tablet Take 1 tablet (2.5 mg total) by mouth 2 (two) times daily. 60 tablet 1   atorvastatin (LIPITOR) 40 MG tablet Take 40 mg by mouth daily with supper.     Calcium Citrate-Vitamin D 315-5 MG-MCG TABS Take 1 tablet by mouth daily.     Carboxymethylcellulose Sod PF (REFRESH PLUS) 0.5 % SOLN Place 1 drop into both eyes 6 (six) times daily.     folic acid (FOLVITE) 1 MG tablet Take 1 mg by mouth daily.     furosemide (LASIX) 20 MG tablet Take 1 tablet (20 mg total) by mouth daily. May take extra tablet daily for wt gain/swelling 60 tablet 6   gabapentin (NEURONTIN) 100 MG capsule Take 100-300 mg by mouth at bedtime. For restless leg     KLOR-CON  20 MEQ packet Take 20 mEq by mouth daily.     levothyroxine (SYNTHROID) 75 MCG tablet Take 75 mcg by mouth daily before breakfast.     metoprolol tartrate (LOPRESSOR) 25 MG tablet Take 1 tablet (25 mg total) by mouth daily as needed (HEART RATE OVER 120). 30 tablet 1   Multiple Vitamins-Minerals (PRESERVISION AREDS) CAPS Take 1 capsule by mouth in the morning and at bedtime.     pantoprazole (PROTONIX) 40 MG tablet Take 1 tablet (40 mg total) by mouth daily. 30 tablet 1   UNABLE TO FIND Take 750 mg by mouth daily. Bone Restore - Life Extension: take 3 tablets daily      Vitamin D, Ergocalciferol, (DRISDOL) 1.25 MG (50000 UNIT) CAPS capsule Take 50,000 Units by mouth every 7 (seven) days. Sunday     metoprolol succinate (TOPROL-XL) 50 MG 24 hr tablet Take 1 tablet (50 mg total) by mouth daily. Hold if systolic blood pressure (top number) less than 100 mmHg or pulse less than 60 bpm. (Patient not taking: Reported on 01/26/2023)     No current facility-administered medications for this encounter.    Atrial Fibrillation Management history:  Previous antiarrhythmic drugs: amiodarone Previous cardioversions: 10/26/22, 01/19/23 Previous ablations: None Anticoagulation history: Eliquis 2.5 mg BID   ROS- All systems are reviewed and negative except as per the HPI above.  Physical Exam: BP 130/66   Pulse 73   Ht 5' 3.5" (1.613 m)   Wt 57.1 kg   BMI 21.93 kg/m   GEN- The patient is well appearing, alert and oriented x 3 today.   Neck - no JVD or carotid bruit noted Lungs- Clear to ausculation bilaterally, normal work of breathing Heart- Regular rate and rhythm, no murmurs, rubs or gallops, PMI not laterally displaced Extremities- no clubbing, cyanosis, or edema Skin - no rash or ecchymosis noted   EKG today demonstrates  Vent. rate 73 BPM PR interval 200 ms QRS duration 128 ms QT/QTcB 468/515 ms P-R-T axes 56 145 1 Sinus rhythm with Premature supraventricular complexes Right axis deviation Non-specific intra-ventricular conduction block Cannot rule out Septal infarct , age undetermined T wave abnormality, consider inferior ischemia Abnormal ECG When compared with ECG of 19-Jan-2023 10:18, PREVIOUS ECG IS PRESENT  Echo 09/20/22 demonstrated  1. Left ventricular ejection fraction, by estimation, is 65 to 70%. The  left ventricle has normal function. The left ventricle has no regional  wall motion abnormalities. There is mild concentric left ventricular  hypertrophy. Left ventricular diastolic  function could not be evaluated.   2. Right  ventricular systolic function is normal. The right ventricular  size is normal. Tricuspid regurgitation signal is inadequate for assessing  PA pressure.   3. Left atrial size was mildly dilated.   4. Moderate pleural effusion in the left lateral region.   5. The mitral valve is normal in structure. Mild mitral valve  regurgitation.   6. The aortic valve is tricuspid. Aortic valve regurgitation is not  visualized. No aortic stenosis is present.   7. The inferior vena cava is normal in size with greater than 50%  respiratory variability, suggesting right atrial pressure of 3 mmHg.   Comparison(s): Echocardiogram done at [facility] on 04/19/21 showed an EF of  60-65%.   ASSESSMENT & PLAN CHA2DS2-VASc Score = 7  The patient's score is based upon: CHF History: 1 HTN History: 1 Diabetes History: 0 Stroke History: 2 Vascular Disease History: 0 Age Score: 2 Gender Score: 1  ASSESSMENT AND PLAN: Persistent Atrial Fibrillation (ICD10:  I48.19) / atrial flutter The patient's CHA2DS2-VASc score is 7, indicating a 11.2% annual risk of stroke.   S/p successful DCCV on 10/26/22 with ERAF.  S/p successful DCCV on 01/19/23.   She is currently in NSR.Marland Kitchen She is currently on amiodarone 200 mg once daily.  Reduce lasix to 20 mg daily. Continue potassium 20 meq daily. Stop Toprol 50 mg daily. Start Toprol low dose 12.5 mg at bedtime.  Qtc stable. Manual interpretation ~485 ms.   Secondary Hypercoagulable State (ICD10:  D68.69) The patient is at significant risk for stroke/thromboembolism based upon her CHA2DS2-VASc Score of 7.  Continue Apixaban (Eliquis).  She is on correct dose of 2.5 mg according to age and weight.   F/u 6 months Afib clinic.    Lake Bells, PA-C  Afib Clinic [facility] [address] [place], [place] [address] [phone]

## 2023-01-27 ENCOUNTER — Other Ambulatory Visit (HOSPITAL_COMMUNITY): Payer: Self-pay

## 2023-01-27 MED ORDER — METOPROLOL SUCCINATE ER 25 MG PO TB24: 12.5000 mg | ORAL_TABLET | Freq: Every day | ORAL | 1 refills | Status: AC

## 2023-02-25 IMAGING — DX DG WRIST COMPLETE 3+V*R*
4 series · 4 of 4 positions shown · non-contrast
Comparison: None.

CLINICAL DATA: 80-year-old female status post fall on outstretched
hand.

EXAM:
RIGHT WRIST - COMPLETE 3+ VIEW

[wrist ap (1 of 2)]
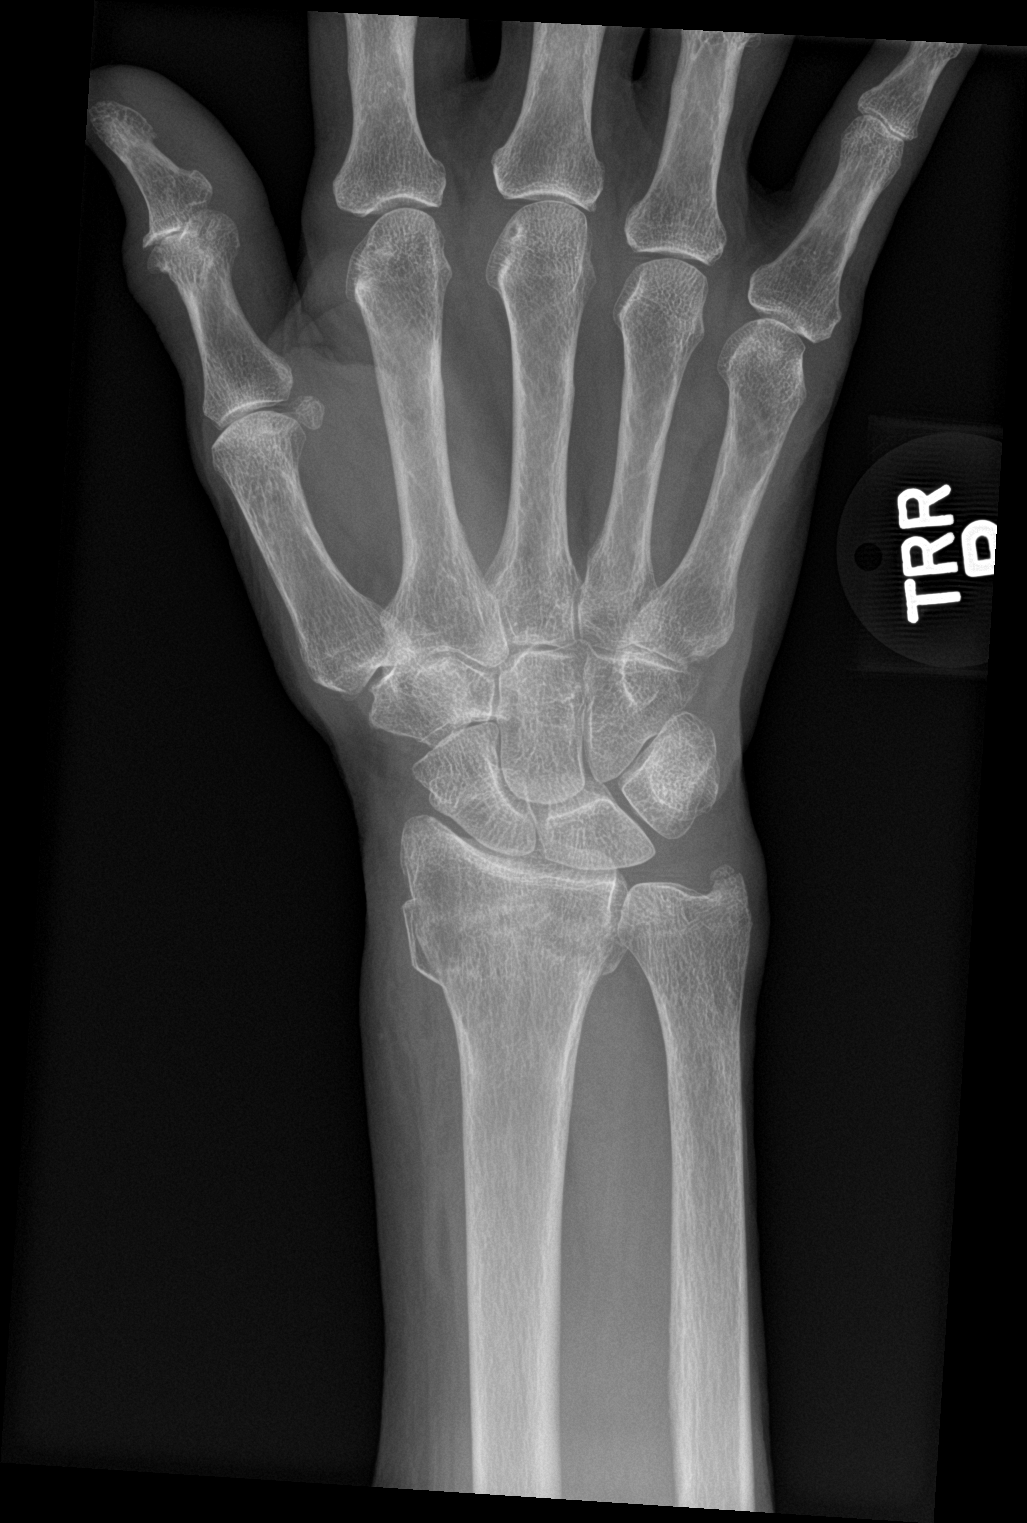

[wrist obl]
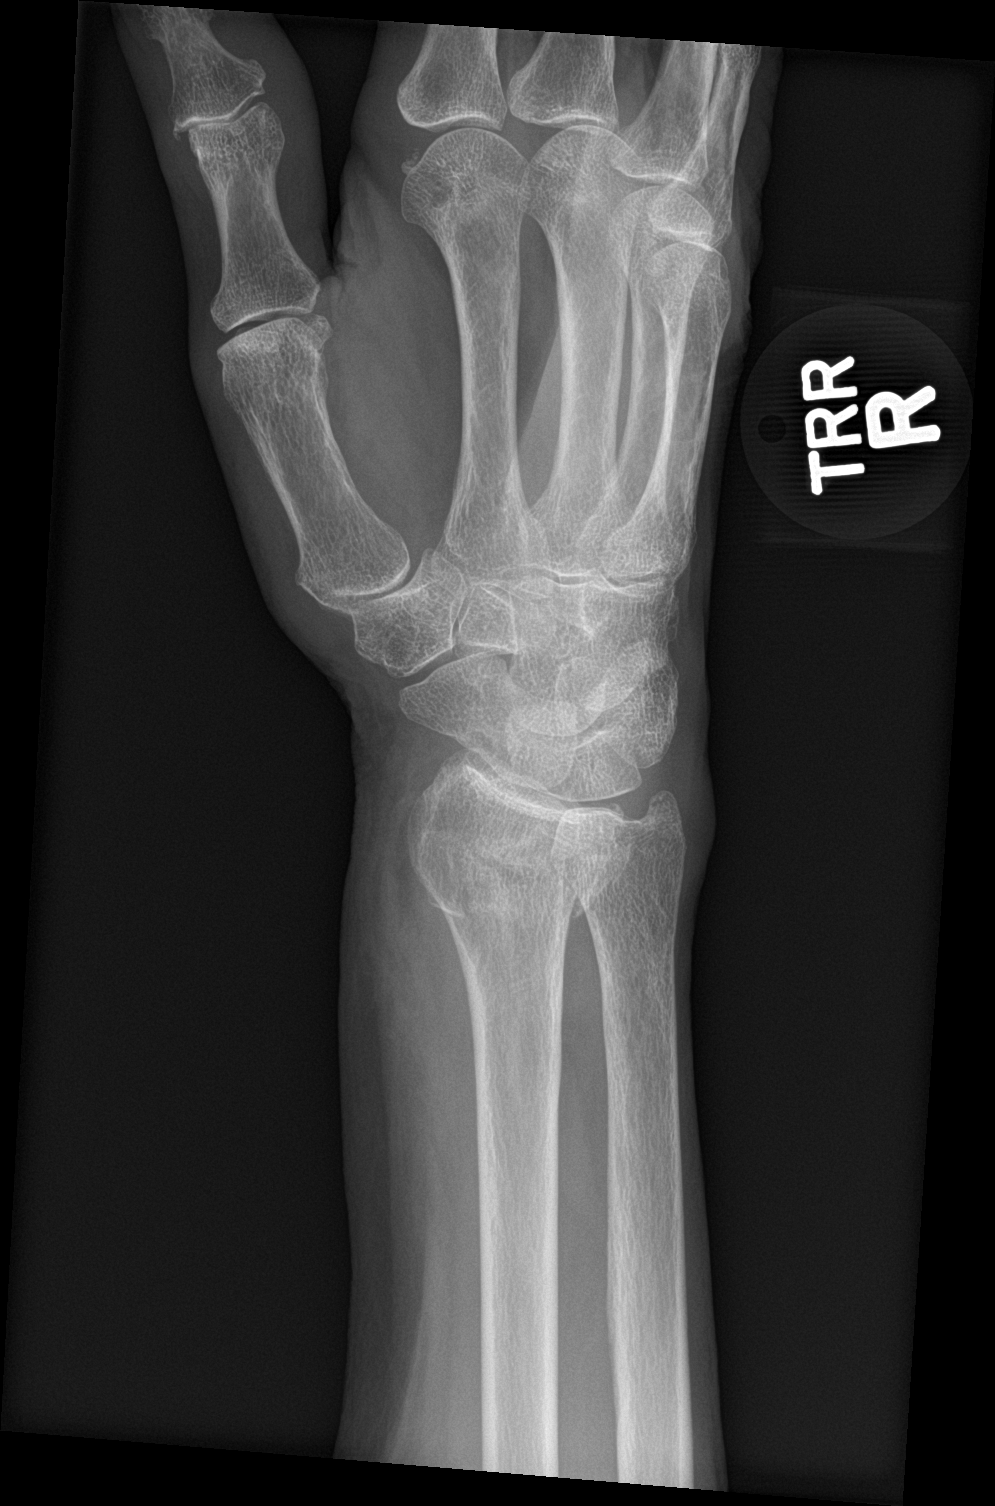

[wrist lat]
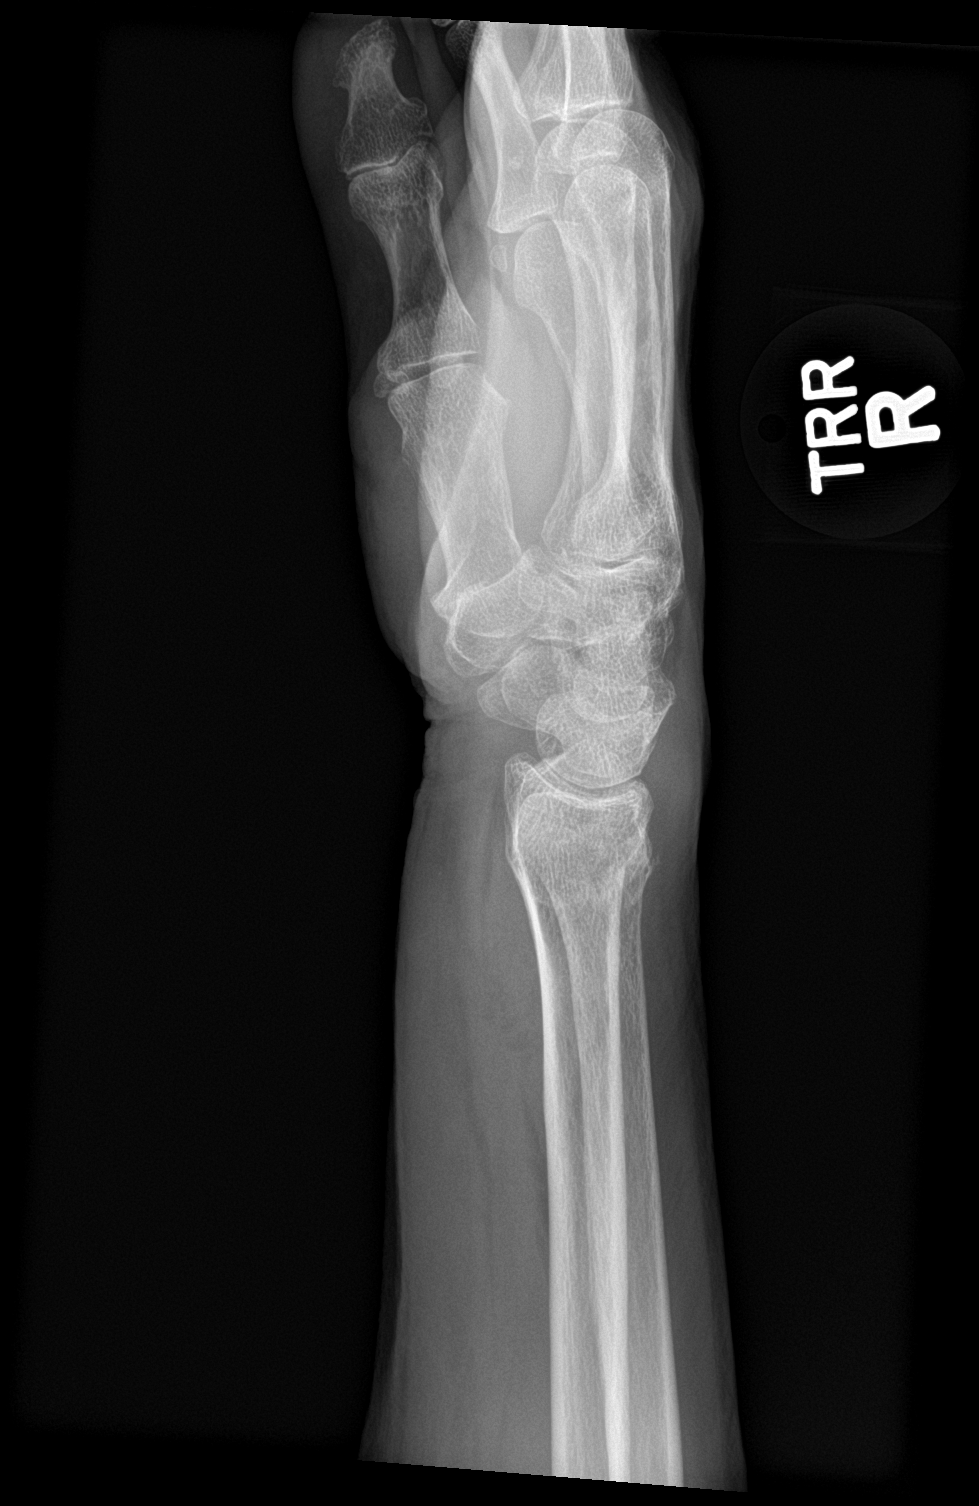

[wrist ap (2 of 2)]
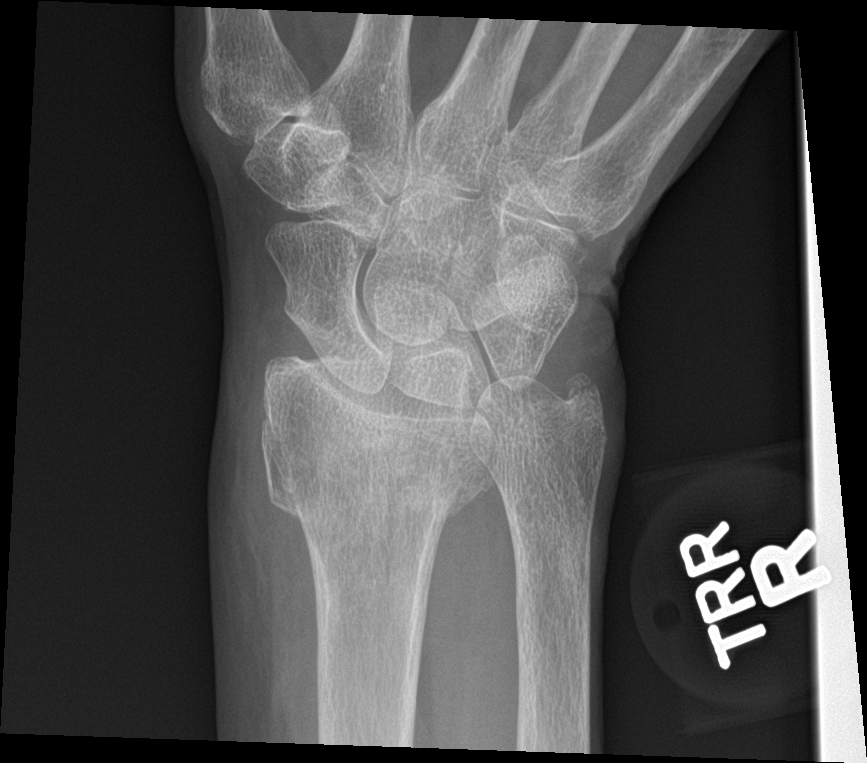

[4 of 4 positions shown; findings below may reference images not displayed]

FINDINGS: Acute, impacted fracture of the distal radius with mild apex palmar
angulation. No evidence of intra-articular involvement. Associated
minimally displaced acute avulsion fracture of the ulnar styloid.
Carpal alignment is maintained. Soft tissue prominence about the
wrist.
IMPRESSION: 1. Acute, impacted and mildly angulated distal radius fracture.
2. Acute, mildly displaced avulsion fracture of the ulnar styloid.

## 2023-06-05 ENCOUNTER — Ambulatory Visit: Attending: Cardiology | Admitting: Cardiology

## 2023-06-05 ENCOUNTER — Encounter: Payer: Self-pay | Admitting: Cardiology

## 2023-06-05 VITALS — BP 136/64 | HR 64 | Ht 63.0 in | Wt 123.0 lb

## 2023-06-05 DIAGNOSIS — Z79899 Other long term (current) drug therapy: Secondary | ICD-10-CM | POA: Diagnosis not present

## 2023-06-05 DIAGNOSIS — E782 Mixed hyperlipidemia: Secondary | ICD-10-CM | POA: Diagnosis not present

## 2023-06-05 DIAGNOSIS — I10 Essential (primary) hypertension: Secondary | ICD-10-CM | POA: Diagnosis not present

## 2023-06-05 DIAGNOSIS — I480 Paroxysmal atrial fibrillation: Secondary | ICD-10-CM | POA: Diagnosis present

## 2023-06-05 NOTE — Patient Instructions (Signed)
 Medication Instructions:  Your physician recommends that you continue on your current medications as directed. Please refer to the Current Medication list given to you today.  *If you need a refill on your cardiac medications before your next appointment, please call your pharmacy*   Lab Work: CMET, Mag, CBC If you have labs (blood work) drawn today and your tests are completely normal, you will receive your results only by: MyChart Message (if you have MyChart) OR A paper copy in the mail If you have any lab test that is abnormal or we need to change your treatment, we will call you to review the results.     Follow-Up: At [facility], you and your health needs are our priority.  As part of our continuing mission to provide you with exceptional heart care, we have created designated Provider Care Teams.  These Care Teams include your primary Cardiologist (physician) and Advanced Practice Providers (APPs -  Physician Assistants and Nurse Practitioners) who all work together to provide you with the care you need, when you need it.   Your next appointment:   6 month(s)  Provider:   Thomasene Ripple, DO

## 2023-06-05 NOTE — Progress Notes (Signed)
 Cardiology Office Note:    Date:  06/05/2023   ID:  Sara Snow, DOB 03/02/1941, MRN [id number]  PCP:  Karna Dupes, MD  Cardiologist:  Thomasene Ripple, DO  Electrophysiologist:  None   Referring MD: Karna Dupes, MD   " I am doing fine"  History of Present Illness:    Sara Snow is a 83 y.o. female with a hx of atrial fibrillation rare ventricular rate she has had at least 2 cardioversions in the past, a chronic diastolic heart failure, hypertension, anemia, melena, hypothyroidism, CVA and hypokalemia.  She was last seen in the A-fib clinic at which time she was post cardioversion.  She tells me that she is doing well she has not had any leg swelling since her cardioversion she is very happy about this.  She is here with her husband today. She offers no complaints.  She reports significant improvement in her symptoms post-cardioversion, noting that she feels "much better." Prior to the procedure, she experienced severe symptoms, including a sensation of her heart "beating improperly," fatigue, and shortness of breath that was so severe she had to stop three times while walking down a hallway. She also reported leg swelling. Currently, she reports feeling "closer" to her normal self. She is on multiple medications including amiodarone, Eliquis, atorvastatin, furosemide, metoprolol, thyroid medication, pantoprazole, and gabapentin. She lives in an independent living facility and has some visual impairment.  Past Medical History:  Diagnosis Date   GERD (gastroesophageal reflux disease)    Hypertension    RA (rheumatoid arthritis) (HCC)    Restless leg syndrome    Stroke [facility])    SVT (supraventricular tachycardia) (HCC)    Thyroid disease     Past Surgical History:  Procedure Laterality Date   BIOPSY  09/21/2022   Procedure: BIOPSY;  Surgeon: Jenel Lucks, MD;  Location: [facility] ENDOSCOPY;  Service: Gastroenterology;;   CARDIOVERSION N/A 10/26/2022   Procedure:  CARDIOVERSION;  Surgeon: Thomasene Ripple, DO;  Location: [facility] INVASIVE CV LAB;  Service: Cardiovascular;  Laterality: N/A;   CARDIOVERSION N/A 01/19/2023   Procedure: CARDIOVERSION;  Surgeon: Tessa Lerner, DO;  Location: [facility] INVASIVE CV LAB;  Service: Cardiovascular;  Laterality: N/A;   ESOPHAGOGASTRODUODENOSCOPY (EGD) WITH PROPOFOL N/A 09/21/2022   Procedure: ESOPHAGOGASTRODUODENOSCOPY (EGD) WITH PROPOFOL;  Surgeon: Jenel Lucks, MD;  Location: [facility] ENDOSCOPY;  Service: Gastroenterology;  Laterality: N/A;    Current Medications: Current Meds  Medication Sig   acetaminophen (TYLENOL) 650 MG CR tablet Take 1,300 mg by mouth in the morning, at noon, and at bedtime.   amiodarone (PACERONE) 200 MG tablet Take 1 tablet (200 mg total) by mouth 2 (two) times daily for 30 days, THEN 1 tablet (200 mg total) daily. (Patient taking differently: 1 tablet (200 mg total) daily.)   apixaban (ELIQUIS) 2.5 MG TABS tablet Take 1 tablet (2.5 mg total) by mouth 2 (two) times daily.   atorvastatin (LIPITOR) 40 MG tablet Take 40 mg by mouth daily with supper.   Calcium Citrate-Vitamin D 315-5 MG-MCG TABS Take 1 tablet by mouth daily.   Carboxymethylcellulose Sod PF (REFRESH PLUS) 0.5 % SOLN Place 1 drop into both eyes 6 (six) times daily.   folic acid (FOLVITE) 1 MG tablet Take 1 mg by mouth daily.   furosemide (LASIX) 20 MG tablet Take 1 tablet (20 mg total) by mouth daily. May take extra tablet daily for wt gain/swelling   gabapentin (NEURONTIN) 100 MG capsule Take 100-300 mg by mouth at bedtime. For restless leg  KLOR-CON 20 MEQ packet Take 20 mEq by mouth daily.   levothyroxine (SYNTHROID) 75 MCG tablet Take 75 mcg by mouth daily before breakfast.   metoprolol succinate (TOPROL-XL) 25 MG 24 hr tablet Take 0.5 tablets (12.5 mg total) by mouth daily.   Multiple Vitamins-Minerals (PRESERVISION AREDS) CAPS Take 1 capsule by mouth in the morning and at bedtime.   pantoprazole (PROTONIX) 40 MG tablet Take 1 tablet (40  mg total) by mouth daily.   UNABLE TO FIND Take 750 mg by mouth daily. Bone Restore - Life Extension: take 3 tablets daily   Vitamin D, Ergocalciferol, (DRISDOL) 1.25 MG (50000 UNIT) CAPS capsule Take 50,000 Units by mouth every 7 (seven) days. Sunday     Allergies:   Plaquenil [hydroxychloroquine], Reglan [metoclopramide], Amlodipine-valsartan-hctz, and Celebrex [celecoxib]   Social History   Socioeconomic History   Marital status: Married    Spouse name: Not on file   Number of children: Not on file   Years of education: Not on file   Highest education level: Not on file  Occupational History   Not on file  Tobacco Use   Smoking status: Never    Passive exposure: Never   Smokeless tobacco: Never  Vaping Use   Vaping status: Never Used  Substance and Sexual Activity   Alcohol use: Yes    Comment: occ   Drug use: Not Currently   Sexual activity: Not on file  Other Topics Concern   Not on file  Social History Narrative   Not on file   Social Drivers of Health   Financial Resource Strain: Not on file  Food Insecurity: Patient Declined (09/20/2022)   Hunger Vital Sign    Worried About Running Out of Food in the Last Year: Patient declined    Ran Out of Food in the Last Year: Patient declined  Transportation Needs: No Transportation Needs (09/20/2022)   PRAPARE - Administrator, Civil Service (Medical): No    Lack of Transportation (Non-Medical): No  Physical Activity: Not on file  Stress: Not on file  Social Connections: Not on file     Family History: The patient's family history is not on file.  ROS:   Review of Systems  Constitution: Negative for decreased appetite, fever and weight gain.  HENT: Negative for congestion, ear discharge, hoarse voice and sore throat.   Eyes: Negative for discharge, redness, vision loss in right eye and visual halos.  Cardiovascular: Negative for chest pain, dyspnea on exertion, leg swelling, orthopnea and palpitations.   Respiratory: Negative for cough, hemoptysis, shortness of breath and snoring.   Endocrine: Negative for heat intolerance and polyphagia.  Hematologic/Lymphatic: Negative for bleeding problem. Does not bruise/bleed easily.  Skin: Negative for flushing, nail changes, rash and suspicious lesions.  Musculoskeletal: Negative for arthritis, joint pain, muscle cramps, myalgias, neck pain and stiffness.  Gastrointestinal: Negative for abdominal pain, bowel incontinence, diarrhea and excessive appetite.  Genitourinary: Negative for decreased libido, genital sores and incomplete emptying.  Neurological: Negative for brief paralysis, focal weakness, headaches and loss of balance.  Psychiatric/Behavioral: Negative for altered mental status, depression and suicidal ideas.  Allergic/Immunologic: Negative for HIV exposure and persistent infections.    EKGs/Labs/Other Studies Reviewed:    The following studies were reviewed today:   EKG:  The ekg ordered today demonstrates   Recent Labs: 09/19/2022: ALT 34; B Natriuretic Peptide 677.8 09/20/2022: TSH 5.772 09/22/2022: Magnesium 2.0 01/26/2023: BUN 10; Creatinine, Ser 1.04; Hemoglobin 11.6; Platelets 202; Potassium 4.6; Sodium 132  Recent Lipid Panel No results found for: "CHOL", "TRIG", "HDL", "CHOLHDL", "VLDL", "LDLCALC", "LDLDIRECT"  Physical Exam:    VS:  BP 136/64 (BP Location: Right Arm, Patient Position: Sitting, Cuff Size: Normal)   Pulse 64   Ht 5\' 3"  (1.6 m)   Wt 123 lb (55.8 kg)   SpO2 96%   BMI 21.79 kg/m     Wt Readings from Last 3 Encounters:  06/05/23 123 lb (55.8 kg)  01/26/23 125 lb 12.8 oz (57.1 kg)  01/03/23 131 lb 12.8 oz (59.8 kg)     GEN: Well nourished, well developed in no acute distress HEENT: Normal NECK: No JVD; No carotid bruits LYMPHATICS: No lymphadenopathy CARDIAC: S1S2 noted,RRR, no murmurs, rubs, gallops RESPIRATORY:  Clear to auscultation without rales, wheezing or rhonchi  ABDOMEN: Soft, non-tender,  non-distended, +bowel sounds, no guarding. EXTREMITIES: No edema, No cyanosis, no clubbing MUSCULOSKELETAL:  No deformity  SKIN: Warm and dry NEUROLOGIC:  Alert and oriented x 3, non-focal PSYCHIATRIC:  Normal affect, good insight  ASSESSMENT:    1. Medication management   2. PAF (paroxysmal atrial fibrillation) (HCC)   3. Primary hypertension   4. Mixed hyperlipidemia    PLAN:    Paroxysmal Atrial Fibrillation -Post-cardioversion, she reports significant symptom improvement with current amiodarone and Eliquis regimen. - Continue amiodarone and Eliquis as prescribed. - Schedule a 71-month follow-up appointment. - Instruct to call the office if symptoms worsen or new symptoms develop.  Hypertension Blood pressure slightly elevated at 140/80 mmHg, stable on metoprolol. - Continue metoprolol as prescribed.  Chronic diastolic heart failure-he appears to be clinically euvolemic.  Is on daily Lasix.  Will continue Lasix for now.  Will get blood work today.   Hyperlipidemia Stable on atorvastatin, no medication changes needed. - Continue atorvastatin as prescribed.  Vision Impairment Significant vision impairment requires effective communication strategies. - Ensure appropriate positioning during visits to accommodate vision impairment.  The patient is in agreement with the above plan. The patient left the office in stable condition.  The patient will follow up in   Medication Adjustments/Labs and Tests Ordered: Current medicines are reviewed at length with the patient today.  Concerns regarding medicines are outlined above.  Orders Placed This Encounter  Procedures   Comprehensive Metabolic Panel (CMET)   Magnesium   CBC with Differential/Platelet   No orders of the defined types were placed in this encounter.   Patient Instructions  Medication Instructions:  Your physician recommends that you continue on your current medications as directed. Please refer to the Current  Medication list given to you today.  *If you need a refill on your cardiac medications before your next appointment, please call your pharmacy*   Lab Work: CMET, Mag, CBC If you have labs (blood work) drawn today and your tests are completely normal, you will receive your results only by: MyChart Message (if you have MyChart) OR A paper copy in the mail If you have any lab test that is abnormal or we need to change your treatment, we will call you to review the results.     Follow-Up: At [facility], you and your health needs are our priority.  As part of our continuing mission to provide you with exceptional heart care, we have created designated Provider Care Teams.  These Care Teams include your primary Cardiologist (physician) and Advanced Practice Providers (APPs -  Physician Assistants and Nurse Practitioners) who all work together to provide you with the care you need, when you need it.  Your next appointment:   6 month(s)  Provider:   Thomasene Ripple, DO           Adopting a Healthy Lifestyle.  Know what a healthy weight is for you (roughly BMI <25) and aim to maintain this   Aim for 7+ servings of fruits and vegetables daily   65-80+ fluid ounces of water or unsweet tea for healthy kidneys   Limit to max 1 drink of alcohol per day; avoid smoking/tobacco   Limit animal fats in diet for cholesterol and heart health - choose grass fed whenever available   Avoid highly processed foods, and foods high in saturated/trans fats   Aim for low stress - take time to unwind and care for your mental health   Aim for 150 min of moderate intensity exercise weekly for heart health, and weights twice weekly for bone health   Aim for 7-9 hours of sleep daily   When it comes to diets, agreement about the perfect plan isnt easy to find, even among the experts. Experts at the [facility] of [employer] developed an idea known as the Healthy Eating Plate. Just imagine a  plate divided into logical, healthy portions.   The emphasis is on diet quality:   Load up on vegetables and fruits - one-half of your plate: Aim for color and variety, and remember that potatoes dont count.   Go for whole grains - one-quarter of your plate: Whole wheat, barley, wheat berries, quinoa, oats, brown rice, and foods made with them. If you want pasta, go with whole wheat pasta.   Protein power - one-quarter of your plate: Fish, chicken, beans, and nuts are all healthy, versatile protein sources. Limit red meat.   The diet, however, does go beyond the plate, offering a few other suggestions.   Use healthy plant oils, such as olive, canola, soy, corn, sunflower and peanut. Check the labels, and avoid partially hydrogenated oil, which have unhealthy trans fats.   If youre thirsty, drink water. Coffee and tea are good in moderation, but skip sugary drinks and limit milk and dairy products to one or two daily servings.   The type of carbohydrate in the diet is more important than the amount. Some sources of carbohydrates, such as vegetables, fruits, whole grains, and beans-are healthier than others.   Finally, stay active  Signed, Thomasene Ripple, DO  06/05/2023 4:44 PM    [facility]

## 2023-06-06 LAB — COMPREHENSIVE METABOLIC PANEL
ALT: 35 IU/L — ABNORMAL HIGH (ref 0–32)
AST: 37 IU/L (ref 0–40)
Albumin: 4.7 g/dL (ref 3.7–4.7)
Alkaline Phosphatase: 86 IU/L (ref 44–121)
BUN/Creatinine Ratio: 20 (ref 12–28)
BUN: 24 mg/dL (ref 8–27)
Bilirubin Total: 0.5 mg/dL (ref 0.0–1.2)
CO2: 26 mmol/L (ref 20–29)
Calcium: 9.5 mg/dL (ref 8.7–10.3)
Chloride: 97 mmol/L (ref 96–106)
Creatinine, Ser: 1.22 mg/dL — ABNORMAL HIGH (ref 0.57–1.00)
Globulin, Total: 2.9 g/dL (ref 1.5–4.5)
Glucose: 91 mg/dL (ref 70–99)
Potassium: 5.5 mmol/L — ABNORMAL HIGH (ref 3.5–5.2)
Sodium: 138 mmol/L (ref 134–144)
Total Protein: 7.6 g/dL (ref 6.0–8.5)
eGFR: 45 mL/min/{1.73_m2} — ABNORMAL LOW (ref >59)

## 2023-06-06 LAB — CBC WITH DIFFERENTIAL/PLATELET
Basophils Absolute: 0 10*3/uL (ref 0.0–0.2)
Basos: 1 %
EOS (ABSOLUTE): 0.1 10*3/uL (ref 0.0–0.4)
Eos: 1 %
Hematocrit: 38.2 % (ref 34.0–46.6)
Hemoglobin: 12.3 g/dL (ref 11.1–15.9)
Immature Grans (Abs): 0 10*3/uL (ref 0.0–0.1)
Immature Granulocytes: 0 %
Lymphocytes Absolute: 1.2 10*3/uL (ref 0.7–3.1)
Lymphs: 22 %
MCH: 30.9 pg (ref 26.6–33.0)
MCHC: 32.2 g/dL (ref 31.5–35.7)
MCV: 96 fL (ref 79–97)
Monocytes Absolute: 0.5 10*3/uL (ref 0.1–0.9)
Monocytes: 9 %
Neutrophils Absolute: 3.7 10*3/uL (ref 1.4–7.0)
Neutrophils: 67 %
Platelets: 196 10*3/uL (ref 150–450)
RBC: 3.98 x10E6/uL (ref 3.77–5.28)
RDW: 12.3 % (ref 11.7–15.4)
WBC: 5.5 10*3/uL (ref 3.4–10.8)

## 2023-06-06 LAB — MAGNESIUM: Magnesium: 2.7 mg/dL — ABNORMAL HIGH (ref 1.6–2.3)

## 2023-06-15 ENCOUNTER — Encounter: Payer: Self-pay | Admitting: Cardiology

## 2023-07-27 ENCOUNTER — Ambulatory Visit (HOSPITAL_COMMUNITY)
Admission: RE | Admit: 2023-07-27 | Discharge: 2023-07-27 | Disposition: A | Discharge status: Home / Self Care | Source: Ambulatory Visit | Attending: Internal Medicine | Admitting: Internal Medicine

## 2023-07-27 VITALS — BP 150/74 | HR 59 | Ht 63.0 in | Wt 124.4 lb

## 2023-07-27 DIAGNOSIS — D6869 Other thrombophilia: Secondary | ICD-10-CM | POA: Insufficient documentation

## 2023-07-27 DIAGNOSIS — Z79899 Other long term (current) drug therapy: Secondary | ICD-10-CM | POA: Diagnosis present

## 2023-07-27 DIAGNOSIS — I4891 Unspecified atrial fibrillation: Secondary | ICD-10-CM | POA: Diagnosis present

## 2023-07-27 DIAGNOSIS — Z5181 Encounter for therapeutic drug level monitoring: Secondary | ICD-10-CM | POA: Diagnosis present

## 2023-07-27 DIAGNOSIS — I4819 Other persistent atrial fibrillation: Secondary | ICD-10-CM

## 2023-07-27 NOTE — Progress Notes (Signed)
 Primary Care Physician: Hoover Luz, MD Primary Cardiologist: Kardie Tobb, DO Electrophysiologist: None     Referring Physician: Lawana Pray     Sara Snow is a 83 y.o. female with a history of coronary and aortic artery calcifications by imaging, HTN, CVA, hypothyroidism, HFpEF, anemia, and persistent atrial fibrillation who presents for consultation in the [facility].  Seen by Charles Connor, NP, on 8/7 and noted to be in Afib. She underwent successful DCCV on 8/14. Seen by Lawana Pray, NP, on 9/4, and found to be in atrial flutter. Currently taking Toprol  50 mg daily. Patient is on Eliquis  2.5 mg BID for a CHADS2VASC score of 7.  On evaluation today, she is currently in Afib. She had ERAF after successful cardioversion on 8/14. No missed doses of Eliquis . She feels very tired when in Afib and SOB with activity.   On follow up 01/03/23, she is currently in Afib. After last OV, she called back 9/23 and determine she would like to try amiodarone  for rhythm control. She is currently taking amiodarone  200 mg BID. No missed doses of Eliquis  2.5 mg BID. Husband notes from paper showing daily weights that she is up about 5 lbs in one week. She notes some more SOB recently than in the past few weeks.   On follow up 01/26/23, she is currently in NSR. She is s/p successful DCCV on 01/19/23. She is currently taking amiodarone  200 mg daily. She was placed on increased lasix  40 mg BID and additional potassium 11/8-11 for increased weight post cardioversion. She transitioned to lasix  40 mg daily since temporary increase. She is doing well overall. Husband has provided me a detailed log of her weights and discontinuation of metoprolol  for HR < 60 bpm. She feels much better since increased lasix . She has lost ~15 lbs since post cardioversion.   On follow up 07/27/23, she is here for amiodarone  surveillance. She is currently in NSR. She has had no episodes of Afib  since last office visit. She is taking amiodarone  200 mg daily. No bleeding issues on Eliquis  2.5 mg BID.   Today, she denies symptoms of palpitations, chest pain, orthopnea, PND, lower extremity edema, dizziness, presyncope, syncope, snoring, daytime somnolence, bleeding, or neurologic sequela. The patient is tolerating medications without difficulties and is otherwise without complaint today.   she has a BMI of Body mass index is 22.04 kg/m.[name] Filed Weights   07/27/23 1316  Weight: 56.4 kg     Current Outpatient Medications  Medication Sig Dispense Refill   acetaminophen  (TYLENOL ) 650 MG CR tablet Take 1,300 mg by mouth in the morning, at noon, and at bedtime.     amiodarone  (PACERONE ) 200 MG tablet Take 1 tablet (200 mg total) by mouth 2 (two) times daily for 30 days, THEN 1 tablet (200 mg total) daily. (Patient taking differently: 1 tablet (200 mg total) daily.) 90 tablet 3   apixaban  (ELIQUIS ) 2.5 MG TABS tablet Take 1 tablet (2.5 mg total) by mouth 2 (two) times daily. 60 tablet 1   atorvastatin  (LIPITOR) 40 MG tablet Take 40 mg by mouth daily with supper.     Calcium  Citrate-Vitamin D 315-5 MG-MCG TABS Take 1 tablet by mouth daily.     Carboxymethylcellulose Sod PF (REFRESH PLUS) 0.5 % SOLN Place 1 drop into both eyes 6 (six) times daily.     folic acid  (FOLVITE ) 1 MG tablet Take 1 mg by mouth daily.     furosemide  (LASIX ) 20 MG  tablet Take 1 tablet (20 mg total) by mouth daily. May take extra tablet daily for wt gain/swelling 60 tablet 6   gabapentin  (NEURONTIN ) 100 MG capsule Take 100-300 mg by mouth at bedtime. For restless leg     KLOR-CON  20 MEQ packet Take 20 mEq by mouth daily.     levothyroxine  (SYNTHROID ) 75 MCG tablet Take 75 mcg by mouth daily before breakfast.     metoprolol  succinate (TOPROL -XL) 25 MG 24 hr tablet Take 0.5 tablets (12.5 mg total) by mouth daily. 45 tablet 1   metoprolol  tartrate (LOPRESSOR ) 25 MG tablet Take 1 tablet (25 mg total) by mouth daily as needed  (HEART RATE OVER 120). 30 tablet 1   Multiple Vitamins-Minerals (PRESERVISION AREDS) CAPS Take 1 capsule by mouth in the morning and at bedtime.     pantoprazole  (PROTONIX ) 40 MG tablet Take 1 tablet (40 mg total) by mouth daily. (Patient taking differently: Take 40 mg by mouth every other day.) 30 tablet 1   UNABLE TO FIND Take 750 mg by mouth daily. Bone Restore - Life Extension: take 3 tablets daily     Vitamin D, Ergocalciferol, (DRISDOL) 1.25 MG (50000 UNIT) CAPS capsule Take 50,000 Units by mouth every 7 (seven) days. Sunday     No current facility-administered medications for this encounter.    Atrial Fibrillation Management history:  Previous antiarrhythmic drugs: amiodarone  Previous cardioversions: 10/26/22, 01/19/23 Previous ablations: None Anticoagulation history: Eliquis  2.5 mg BID   ROS- All systems are reviewed and negative except as per the HPI above.  Physical Exam: BP (!) 150/74   Pulse (!) 59   Ht 5\' 3"  (1.6 m)   Wt 56.4 kg   BMI 22.04 kg/m   GEN- The patient is well appearing, alert and oriented x 3 today.   Neck - no JVD or carotid bruit noted Lungs- Clear to ausculation bilaterally, normal work of breathing Heart- Regular rate and rhythm, no murmurs, rubs or gallops, PMI not laterally displaced Extremities- no clubbing, cyanosis, or edema Skin - no rash or ecchymosis noted   EKG today demonstrates  Vent. rate 59 BPM PR interval 180 ms QRS duration 132 ms QT/QTcB 492/487 ms P-R-T axes 58 129 -11 Sinus bradycardia Right axis deviation Non-specific intra-ventricular conduction block Cannot rule out Septal infarct (cited on or before 26-Jan-2023) T wave abnormality, consider inferior ischemia Abnormal ECG When compared with ECG of 26-Jan-2023 13:32, Premature supraventricular complexes are no longer Present Nonspecific T wave abnormality now evident in Lateral leads  Echo 09/20/22 demonstrated  1. Left ventricular ejection fraction, by estimation, is  65 to 70%. The  left ventricle has normal function. The left ventricle has no regional  wall motion abnormalities. There is mild concentric left ventricular  hypertrophy. Left ventricular diastolic  function could not be evaluated.   2. Right ventricular systolic function is normal. The right ventricular  size is normal. Tricuspid regurgitation signal is inadequate for assessing  PA pressure.   3. Left atrial size was mildly dilated.   4. Moderate pleural effusion in the left lateral region.   5. The mitral valve is normal in structure. Mild mitral valve  regurgitation.   6. The aortic valve is tricuspid. Aortic valve regurgitation is not  visualized. No aortic stenosis is present.   7. The inferior vena cava is normal in size with greater than 50%  respiratory variability, suggesting right atrial pressure of 3 mmHg.   Comparison(s): Echocardiogram done at [facility] on 04/19/21 showed an EF of  60-65%.   ASSESSMENT & PLAN CHA2DS2-VASc Score = 7  The patient's score is based upon: CHF History: 1 HTN History: 1 Diabetes History: 0 Stroke History: 2 Vascular Disease History: 0 Age Score: 2 Gender Score: 1       ASSESSMENT AND PLAN: Persistent Atrial Fibrillation (ICD10:  I48.19) / atrial flutter The patient's CHA2DS2-VASc score is 7, indicating a 11.2% annual risk of stroke.   S/p successful DCCV on 10/26/22 with ERAF.  S/p successful DCCV on 01/19/23.   She is currently in NSR. She is doing well overall.   High risk medication monitoring (ICD10: [id number]) Patient requires ongoing monitoring for anti-arrhythmic medication which has the potential to cause life threatening arrhythmias or AV block. Qtc stable. Continue amiodarone  200 mg daily. Will draw TSH and recheck Bmet - potassium elevated in March. If potassium is persistently elevated, will reduce K to 10 meq daily.   Secondary Hypercoagulable State (ICD10:  D68.69) The patient is at significant risk for  stroke/thromboembolism based upon her CHA2DS2-VASc Score of 7.  Continue Apixaban  (Eliquis ).  Continue Eliquis  2.5 mg BID which is correct dosage based on age and weight below 60 kg.    F/u 6 months Afib clinic for amiodarone  surveillance.    Sara Amber, PA-C  Afib Clinic [facility] [address] [place], [place] [address] [phone]

## 2023-07-28 ENCOUNTER — Ambulatory Visit (HOSPITAL_COMMUNITY): Payer: Self-pay | Admitting: Internal Medicine

## 2023-07-28 LAB — BASIC METABOLIC PANEL WITH GFR
BUN/Creatinine Ratio: 21 (ref 12–28)
BUN: 23 mg/dL (ref 8–27)
CO2: 25 mmol/L (ref 20–29)
Calcium: 9.4 mg/dL (ref 8.7–10.3)
Chloride: 96 mmol/L (ref 96–106)
Creatinine, Ser: 1.07 mg/dL — ABNORMAL HIGH (ref 0.57–1.00)
Glucose: 89 mg/dL (ref 70–99)
Potassium: 4.6 mmol/L (ref 3.5–5.2)
Sodium: 136 mmol/L (ref 134–144)
eGFR: 52 mL/min/{1.73_m2} — ABNORMAL LOW (ref >59)

## 2023-07-28 LAB — TSH: TSH: 13.5 u[IU]/mL — ABNORMAL HIGH (ref 0.450–4.500)

## 2023-07-29 ENCOUNTER — Other Ambulatory Visit: Payer: Self-pay

## 2023-07-29 ENCOUNTER — Emergency Department (HOSPITAL_COMMUNITY)

## 2023-07-29 ENCOUNTER — Emergency Department (HOSPITAL_COMMUNITY)
Admission: EM | Admit: 2023-07-29 | Discharge: 2023-07-29 | Disposition: A | Discharge status: Home / Self Care | Attending: Emergency Medicine | Admitting: Emergency Medicine

## 2023-07-29 DIAGNOSIS — R079 Chest pain, unspecified: Secondary | ICD-10-CM | POA: Insufficient documentation

## 2023-07-29 DIAGNOSIS — Z7901 Long term (current) use of anticoagulants: Secondary | ICD-10-CM | POA: Diagnosis not present

## 2023-07-29 DIAGNOSIS — K449 Diaphragmatic hernia without obstruction or gangrene: Secondary | ICD-10-CM | POA: Insufficient documentation

## 2023-07-29 DIAGNOSIS — R55 Syncope and collapse: Secondary | ICD-10-CM | POA: Insufficient documentation

## 2023-07-29 DIAGNOSIS — R911 Solitary pulmonary nodule: Secondary | ICD-10-CM | POA: Diagnosis not present

## 2023-07-29 DIAGNOSIS — R531 Weakness: Secondary | ICD-10-CM

## 2023-07-29 LAB — COMPREHENSIVE METABOLIC PANEL WITH GFR
ALT: 37 U/L (ref 0–44)
AST: 39 U/L (ref 15–41)
Albumin: 3.8 g/dL (ref 3.5–5.0)
Alkaline Phosphatase: 66 U/L (ref 38–126)
Anion gap: 12 (ref 5–15)
BUN: 26 mg/dL — ABNORMAL HIGH (ref 8–23)
CO2: 25 mmol/L (ref 22–32)
Calcium: 9.6 mg/dL (ref 8.9–10.3)
Chloride: 100 mmol/L (ref 98–111)
Creatinine, Ser: 1.14 mg/dL — ABNORMAL HIGH (ref 0.44–1.00)
GFR, Estimated: 48 mL/min — ABNORMAL LOW (ref >60)
Glucose, Bld: 94 mg/dL (ref 70–99)
Potassium: 4.6 mmol/L (ref 3.5–5.1)
Sodium: 137 mmol/L (ref 135–145)
Total Bilirubin: 1.1 mg/dL (ref 0.0–1.2)
Total Protein: 7.4 g/dL (ref 6.5–8.1)

## 2023-07-29 LAB — CBC WITH DIFFERENTIAL/PLATELET
Abs Immature Granulocytes: 0.03 10*3/uL (ref 0.00–0.07)
Basophils Absolute: 0.1 10*3/uL (ref 0.0–0.1)
Basophils Relative: 1 %
Eosinophils Absolute: 0.1 10*3/uL (ref 0.0–0.5)
Eosinophils Relative: 1 %
HCT: 37.1 % (ref 36.0–46.0)
Hemoglobin: 11.9 g/dL — ABNORMAL LOW (ref 12.0–15.0)
Immature Granulocytes: 0 %
Lymphocytes Relative: 12 %
Lymphs Abs: 0.9 10*3/uL (ref 0.7–4.0)
MCH: 31.5 pg (ref 26.0–34.0)
MCHC: 32.1 g/dL (ref 30.0–36.0)
MCV: 98.1 fL (ref 80.0–100.0)
Monocytes Absolute: 0.6 10*3/uL (ref 0.1–1.0)
Monocytes Relative: 7 %
Neutro Abs: 5.9 10*3/uL (ref 1.7–7.7)
Neutrophils Relative %: 79 %
Platelets: 195 10*3/uL (ref 150–400)
RBC: 3.78 MIL/uL — ABNORMAL LOW (ref 3.87–5.11)
RDW: 15 % (ref 11.5–15.5)
WBC: 7.5 10*3/uL (ref 4.0–10.5)
nRBC: 0 % (ref 0.0–0.2)

## 2023-07-29 LAB — URINALYSIS, ROUTINE W REFLEX MICROSCOPIC
Bilirubin Urine: NEGATIVE
Glucose, UA: NEGATIVE mg/dL
Hgb urine dipstick: NEGATIVE
Ketones, ur: NEGATIVE mg/dL
Leukocytes,Ua: NEGATIVE
Nitrite: NEGATIVE
Protein, ur: NEGATIVE mg/dL
Specific Gravity, Urine: 1.009 (ref 1.005–1.030)
pH: 7 (ref 5.0–8.0)

## 2023-07-29 LAB — MAGNESIUM: Magnesium: 2.6 mg/dL — ABNORMAL HIGH (ref 1.7–2.4)

## 2023-07-29 LAB — D-DIMER, QUANTITATIVE: D-Dimer, Quant: <0.27 ug{FEU}/mL (ref 0.00–0.50)

## 2023-07-29 LAB — TROPONIN I (HIGH SENSITIVITY)
Troponin I (High Sensitivity): 20 ng/L — ABNORMAL HIGH (ref <18)
Troponin I (High Sensitivity): 18 ng/L — ABNORMAL HIGH (ref <18)

## 2023-07-29 MED ORDER — IOHEXOL 350 MG/ML SOLN
100.0000 mL | Freq: Once | INTRAVENOUS | Status: AC | PRN
  Administered 2023-07-29: 100 mL via INTRAVENOUS

## 2023-07-29 MED ORDER — NITROGLYCERIN 0.4 MG SL SUBL: 0.4000 mg | SUBLINGUAL_TABLET | SUBLINGUAL | Status: DC | PRN

## 2023-07-29 MED ORDER — ALUM & MAG HYDROXIDE-SIMETH 200-200-20 MG/5ML PO SUSP
30.0000 mL | Freq: Once | ORAL | Status: AC
  Administered 2023-07-29: 30 mL via ORAL
  Filled 2023-07-29: qty 30

## 2023-07-29 MED ORDER — LIDOCAINE VISCOUS HCL 2 % MT SOLN
15.0000 mL | Freq: Once | OROMUCOSAL | Status: DC
  Filled 2023-07-29: qty 15

## 2023-07-29 NOTE — Progress Notes (Signed)
 ON CALL PHONE CONSULT  Call from Dr. Kennard Pea, ER   Discussion: Patient presented with presyncopal like episode, CT head with concern for a small hyperdensity in the left thalamus versus mineralization.  On my review, it appears to be mineralization.  According to the EDP, patient does not have any localizing signs or symptoms.   Recs:  No other inpatient workup needed.  Follow-up with outpatient neurology as needed  -- Tona Francis, MD Neurologist [facility] Pager: [phone]

## 2023-07-29 NOTE — Discharge Instructions (Addendum)
 Your workup was overall reassuring.  Your CT head showed a possible area of hemorrhage versus mineralization and on review, neurology felt that this was likely mineralization.  You have no headache, no focal neurologic deficits.  You are maintained on Eliquis .  Continue to take that medication.  Return for any severe worsening neurodeficits, headache or any other concerns, episode of passing out.  You had a brief episode of chest pain in the emergency department which resolved and your cardiac enzymes were only mildly elevated at 20 and 18 and were downtrending, low concern for any heart attack.  Low concern for a blood clot in your chest.  Your CT showed a number of incidental findings, no acute concerns.  Your symptoms of chest discomfort could have been due to a hiatal hernia which was seen on CT imaging.  Considered admission for observation versus discharge and given the resolution of your symptoms and reassuring workup, will plan for discharge at this time.  Please follow-up with your PCP to discuss the results of your diagnostic testing.  CTA C/A/P:  IMPRESSION:  No evidence of aortic aneurysm or dissection. No evidence of  pulmonary embolus.    Mild cardiomegaly.    Moderate-sized hiatal hernia.    5 mm perifissural left lung nodule. No follow-up needed if patient  is low-risk.This recommendation follows the consensus statement:  Guidelines for Management of Incidental Pulmonary Nodules Detected  on CT Images: From the Fleischner Society 2017; Radiology 2017;  284:228-243.    Sigmoid diverticulosis. No active diverticulitis. Large stool burden  throughout the colon.

## 2023-07-29 NOTE — ED Triage Notes (Signed)
 Pt BIB EMS from [facility]. Pt was sitting in a chair styling her hair and became very weak and felt like she was going to fall out of chair. No LOC. Weakness lasted 5 mins. Pt states it felt like her previous CVA. Initial BP 114/60 and on arrival to ED 154/72 which is normal baseline for her. Hx of Afib. Aox4.

## 2023-07-29 NOTE — ED Provider Notes (Signed)
 [place] EMERGENCY DEPARTMENT AT [facility] Provider Note   CSN: [id number] Arrival date & time: 07/29/23  1118     History  Chief Complaint  Patient presents with   Weakness    Sara Snow is a 83 y.o. female.   Weakness    83 year old female with medical history significant for CVA on Eliquis  who presents to the emergency department after a near syncopal episode.  The patient states that at her independent living facility she was sitting in a hairstyle lysing her hair when she suddenly became very weak and felt like she was going to pass out.  No full loss of consciousness.  Symptoms lasted 5 to 10 minutes.  Her whole body felt weak, no focal deficits.  No difficulty with speech or swallowing.  Symptoms have since resolved.  She states that she has not missed any doses of her Eliquis .  She denies any chest pain, shortness of breath or heart palpitations.  Home Medications Prior to Admission medications   Medication Sig Start Date End Date Taking? Authorizing Provider  acetaminophen  (TYLENOL ) 650 MG CR tablet Take 1,300 mg by mouth in the morning, at noon, and at bedtime. 04/21/22   [provider]  amiodarone  (PACERONE ) 200 MG tablet Take 1 tablet (200 mg total) by mouth 2 (two) times daily for 30 days, THEN 1 tablet (200 mg total) daily. Patient taking differently: 1 tablet (200 mg total) daily. 12/05/22 01/04/24  Nathanel Bal, PA-C  apixaban  (ELIQUIS ) 2.5 MG TABS tablet Take 1 tablet (2.5 mg total) by mouth 2 (two) times daily. 09/22/22   Daren Eck, DO  atorvastatin  (LIPITOR) 40 MG tablet Take 40 mg by mouth daily with supper. 01/31/22   [provider]  Calcium  Citrate-Vitamin D 315-5 MG-MCG TABS Take 1 tablet by mouth daily. 06/28/16   [provider]  Carboxymethylcellulose Sod PF (REFRESH PLUS) 0.5 % SOLN Place 1 drop into both eyes 6 (six) times daily. 06/28/16   [provider]  folic acid  (FOLVITE ) 1 MG tablet Take  1 mg by mouth daily. 07/19/21   [provider]  furosemide  (LASIX ) 20 MG tablet Take 1 tablet (20 mg total) by mouth daily. May take extra tablet daily for wt gain/swelling 01/10/23   Fenton, Clint R, PA  gabapentin  (NEURONTIN ) 100 MG capsule Take 100-300 mg by mouth at bedtime. For restless leg 10/11/21   [provider]  KLOR-CON  20 MEQ packet Take 20 mEq by mouth daily. 11/30/22   [provider]  levothyroxine  (SYNTHROID ) 75 MCG tablet Take 75 mcg by mouth daily before breakfast. 11/28/22   [provider]  metoprolol  succinate (TOPROL -XL) 25 MG 24 hr tablet Take 0.5 tablets (12.5 mg total) by mouth daily. 01/27/23   Nathanel Bal, PA-C  metoprolol  tartrate (LOPRESSOR ) 25 MG tablet Take 1 tablet (25 mg total) by mouth daily as needed (HEART RATE OVER 120). 10/19/22 07/27/23  Gerald Kitty., NP  Multiple Vitamins-Minerals (PRESERVISION AREDS) CAPS Take 1 capsule by mouth in the morning and at bedtime. 12/29/15   [provider]  pantoprazole  (PROTONIX ) 40 MG tablet Take 1 tablet (40 mg total) by mouth daily. Patient taking differently: Take 40 mg by mouth every other day. 09/22/22   Daren Eck, DO  UNABLE TO FIND Take 750 mg by mouth daily. Bone Restore - Life Extension: take 3 tablets daily    [provider]  Vitamin D, Ergocalciferol, (DRISDOL) 1.25 MG (50000 UNIT) CAPS capsule Take 50,000  Units by mouth every 7 (seven) days. Sunday 12/14/21   [provider]      Allergies    Plaquenil [hydroxychloroquine], Reglan [metoclopramide], Amlodipine-valsartan-hctz, and Celebrex [celecoxib]    Review of Systems   Review of Systems  Neurological:  Positive for weakness and light-headedness.  All other systems reviewed and are negative.   Physical Exam Updated Vital Signs BP (!) 177/77   Pulse (!) 59   Temp (!) 96.7 F (35.9 C) (Axillary)   Resp 17   Ht 5\' 3"  (1.6 m)   Wt 54.4 kg   SpO2 97%   BMI 21.26 kg/m  Physical  Exam Vitals and nursing note reviewed.  Constitutional:      General: She is not in acute distress.    Appearance: She is well-developed.  HENT:     Head: Normocephalic and atraumatic.  Eyes:     Conjunctiva/sclera: Conjunctivae normal.  Cardiovascular:     Rate and Rhythm: Normal rate and regular rhythm.  Pulmonary:     Effort: Pulmonary effort is normal. No respiratory distress.     Breath sounds: Normal breath sounds.  Abdominal:     Palpations: Abdomen is soft.     Tenderness: There is no abdominal tenderness.  Musculoskeletal:        General: No swelling.     Cervical back: Neck supple.  Skin:    General: Skin is warm and dry.     Capillary Refill: Capillary refill takes less than 2 seconds.  Neurological:     Mental Status: She is alert.     Comments: MENTAL STATUS EXAM:    Orientation: Alert and oriented to person, place and time.  Memory: Cooperative, follows commands well.  Language: Speech is clear and language is normal.   CRANIAL NERVES:    CN 2 (Optic): Visual fields intact to confrontation.  CN 3,4,6 (EOM): Pupils equal and reactive to light. Full extraocular eye movement without nystagmus.  CN 5 (Trigeminal): Facial sensation is normal, no weakness of masticatory muscles.  CN 7 (Facial): No facial weakness or asymmetry.  CN 8 (Auditory): Auditory acuity grossly normal.  CN 9,10 (Glossophar): The uvula is midline, the palate elevates symmetrically.  CN 11 (spinal access): Normal sternocleidomastoid and trapezius strength.  CN 12 (Hypoglossal): The tongue is midline. No atrophy or fasciculations.[name]   MOTOR:  Muscle Strength: 5/5RUE, 5/5LUE, 5/5RLE, 5/5LLE.   COORDINATION:   No tremor.   SENSATION:   Intact to light touch all four extremities.  GAIT: Gait deferred   Psychiatric:        Mood and Affect: Mood normal.     ED Results / Procedures / Treatments   Labs (all labs ordered are listed, but only abnormal results are displayed) Labs Reviewed   CBC WITH DIFFERENTIAL/PLATELET - Abnormal; Notable for the following components:      Result Value   RBC 3.78 (*)    Hemoglobin 11.9 (*)    All other components within normal limits  COMPREHENSIVE METABOLIC PANEL WITH GFR - Abnormal; Notable for the following components:   BUN 26 (*)    Creatinine, Ser 1.14 (*)    GFR, Estimated 48 (*)    All other components within normal limits  MAGNESIUM - Abnormal; Notable for the following components:   Magnesium 2.6 (*)    All other components within normal limits  URINALYSIS, ROUTINE W REFLEX MICROSCOPIC - Abnormal; Notable for the following components:   APPearance HAZY (*)    All other components within  normal limits  TROPONIN I (HIGH SENSITIVITY) - Abnormal; Notable for the following components:   Troponin I (High Sensitivity) 20 (*)    All other components within normal limits  TROPONIN I (HIGH SENSITIVITY) - Abnormal; Notable for the following components:   Troponin I (High Sensitivity) 18 (*)    All other components within normal limits  D-DIMER, QUANTITATIVE    EKG EKG Interpretation Date/Time:  Saturday Jul 29 2023 15:03:09 EDT Ventricular Rate:  57 PR Interval:  181 QRS Duration:  146 QT Interval:  481 QTC Calculation: 469 R Axis:   129  Text Interpretation: Sinus rhythm Nonspecific intraventricular conduction delay Probable anteroseptal infarct, old No STEMI Confirmed by Rosealee Concha ([id number]) on 07/29/2023 3:09:10 PM  Radiology CT Angio Chest/Abd/Pel for Dissection W and/or Wo Contrast Result Date: 07/29/2023 CLINICAL DATA:  Acute aortic syndrome (AAS) suspected.  Weakness EXAM: CT ANGIOGRAPHY CHEST, ABDOMEN AND PELVIS TECHNIQUE: Non-contrast CT of the chest was initially obtained. Multidetector CT imaging through the chest, abdomen and pelvis was performed using the standard protocol during bolus administration of intravenous contrast. Multiplanar reconstructed images and MIPs were obtained and reviewed to evaluate the  vascular anatomy. RADIATION DOSE REDUCTION: This exam was performed according to the departmental dose-optimization program which includes automated exposure control, adjustment of the mA and/or kV according to patient size and/or use of iterative reconstruction technique. CONTRAST:  OMNIPAQUE  IOHEXOL  350 MG/ML SOLN COMPARISON:  09/19/2022 FINDINGS: CTA CHEST FINDINGS Cardiovascular: Cardiomegaly. No evidence of aortic aneurysm or dissection. No filling defects in the pulmonary arteries to suggest pulmonary emboli. Mediastinum/Nodes: No mediastinal, hilar, or axillary adenopathy. Trachea and esophagus are unremarkable. Thyroid unremarkable. Moderate sized hiatal hernia. Lungs/Pleura: 5 mm Peri fissural nodule on the left on image 32. No confluent opacities or effusions. Musculoskeletal: Chest wall soft tissues are unremarkable. Slight compression fracture at T7. Mild compression fracture at T11. These are stable since prior study. Review of the MIP images confirms the above findings. CTA ABDOMEN AND PELVIS FINDINGS VASCULAR Aorta: Normal caliber aorta without aneurysm, dissection, vasculitis or significant stenosis. Aortic atherosclerosis. Celiac: Mild narrowing at the origin of the celiac artery, likely median arcuate ligament impression. No aneurysm or dissection. SMA: Patent without evidence of aneurysm, dissection, vasculitis or significant stenosis. Renals: Both renal arteries are patent without evidence of aneurysm, dissection, vasculitis, fibromuscular dysplasia or significant stenosis. IMA: Patent without evidence of aneurysm, dissection, vasculitis or significant stenosis. Inflow: Patent without evidence of aneurysm, dissection, vasculitis or significant stenosis. Scattered calcifications. Veins: No obvious venous abnormality within the limitations of this arterial phase study. Review of the MIP images confirms the above findings. NON-VASCULAR Hepatobiliary: No suspicious focal hepatic abnormality.  Gallbladder unremarkable. No biliary ductal dilatation. Pancreas: No focal abnormality or ductal dilatation. Spleen: No focal abnormality.  Normal size. Adrenals/Urinary Tract: No adrenal abnormality. No focal renal abnormality. No stones or hydronephrosis. Urinary bladder is unremarkable. Stomach/Bowel: Large stool burden throughout the colon. Sigmoid diverticulosis. No active diverticulitis. Stomach and small bowel decompressed. Lymphatic: No adenopathy Reproductive: Prior hysterectomy.  No adnexal masses. Other: No free fluid or free air. Musculoskeletal: No acute bony abnormality. Degenerative changes throughout the lumbar spine. Review of the MIP images confirms the above findings. IMPRESSION: No evidence of aortic aneurysm or dissection. No evidence of pulmonary embolus. Mild cardiomegaly. Moderate-sized hiatal hernia. 5 mm perifissural left lung nodule. No follow-up needed if patient is low-risk.This recommendation follows the consensus statement: Guidelines for Management of Incidental Pulmonary Nodules Detected on CT Images: From the Fleischner Society 2017;  Radiology 2017; [id number]. Sigmoid diverticulosis. No active diverticulitis. Large stool burden throughout the colon. Electronically Signed   By: Janeece Mechanic M.D.   On: 07/29/2023 17:24   CT Head Wo Contrast Result Date: 07/29/2023 CLINICAL DATA:  Provided history: Facial paralysis/weakness. EXAM: CT HEAD WITHOUT CONTRAST TECHNIQUE: Contiguous axial images were obtained from the base of the skull through the vertex without intravenous contrast. RADIATION DOSE REDUCTION: This exam was performed according to the departmental dose-optimization program which includes automated exposure control, adjustment of the mA and/or kV according to patient size and/or use of iterative reconstruction technique. COMPARISON:  Head CT 03/18/2022. FINDINGS: Brain: Mild generalized cerebral atrophy. 4 mm hyperdense focus within the medial left thalamus, new from the  prior head CT of 03/18/2022 (series 5, image 29) (series 6, image 33). Known chronic cortical/subcortical infarct within the left occipital lobe (PCA vascular territory). Background patchy and ill-defined hypoattenuation within the cerebral white matter and bilateral deep gray nuclei, nonspecific but compatible with advanced chronic small vessel ischemic disease. No acute demarcated cortical infarct. No extra-axial fluid collection. No midline shift. Vascular: No hyperdense vessel.  Atherosclerotic calcifications. Skull: No calvarial fracture or aggressive osseous lesion. Sinuses/Orbits: No mass or acute finding within the imaged orbits. Minimal mucosal thickening within the right sphenoid sinus. Impression #1 called by telephone at the time of interpretation on 07/29/2023 at 1:21 pm to provider [facility] , who verbally acknowledged these results. IMPRESSION: 1. 4 mm hyperdense focus within the left thalamus, new from the prior head CT of 03/18/2022 and suspicious for a small acute parenchymal hemorrhage. Alternatively, this may reflect a new focus of mineralization or other hyperdense lesion. Imaging follow-up to resolution recommended. 2. Known chronic left PCA territory infarct. 3. Background advanced chronic small vessel ischemic disease. 4. Mild generalized cerebral atrophy. Electronically Signed   By: Bascom Lily D.O.   On: 07/29/2023 13:22   DG Chest Portable 1 View Result Date: 07/29/2023 CLINICAL DATA:  Weakness EXAM: PORTABLE CHEST 1 VIEW COMPARISON:  09/19/2022 FINDINGS: Single frontal view of the chest demonstrates a stable enlarged cardiac silhouette. There is patchy scratch at there is minimal consolidation at the left lung base, which could reflect atelectasis or airspace disease. No effusion or pneumothorax. No acute bony abnormalities. IMPRESSION: 1. Minimal patchy left basilar consolidation which may reflect atelectasis or airspace disease. 2. Stable enlarged cardiac silhouette.  Electronically Signed   By: Bobbye Burrow M.D.   On: 07/29/2023 11:56    Procedures Procedures    Medications Ordered in ED Medications  nitroGLYCERIN (NITROSTAT) SL tablet 0.4 mg (has no administration in time range)  alum & mag hydroxide-simeth (MAALOX/MYLANTA) 200-200-20 MG/5ML suspension 30 mL (has no administration in time range)    And  lidocaine  (XYLOCAINE ) 2 % viscous mouth solution 15 mL (has no administration in time range)  iohexol  (OMNIPAQUE ) 350 MG/ML injection 100 mL (100 mLs Intravenous Contrast Given 07/29/23 1716)    ED Course/ Medical Decision Making/ A&P                                 Medical Decision Making Amount and/or Complexity of Data Reviewed Labs: ordered. Radiology: ordered.  Risk OTC drugs. Prescription drug management.     83 year old female with medical history significant for CVA on Eliquis  who presents to the emergency department after a near syncopal episode.  The patient states that at her independent living facility she was sitting in a  hairstyle lysing her hair when she suddenly became very weak and felt like she was going to pass out.  No full loss of consciousness.  Symptoms lasted 5 to 10 minutes.  Her whole body felt weak, no focal deficits.  No difficulty with speech or swallowing.  Symptoms have since resolved.  She states that she has not missed any doses of her Eliquis .  She denies any chest pain, shortness of breath or heart palpitations.  Medical Decision Making:   Shelba Susi is a 83 y.o. female who presented to the ED today with a  near syncopal episode detailed above.    Patient placed on continuous vitals and telemetry monitoring while in ED which was reviewed periodically.  Complete initial physical exam performed, notably the patient  was neuro intact, CTAB, well appearing.    Reviewed and confirmed nursing documentation for past medical history, family history, social history.    Initial Assessment:   With the  patient's presentation of syncope, most likely diagnosis is orthostatic hypotension vs vasovagal episode. Other diagnoses were considered including (but not limited to) arrythmogenic syncope, valvular abnormality, PE, aortic dissection. These are considered less likely due to history of present illness and physical exam findings.     Initial Plan:  CT Head  Screening labs including CBC and Metabolic panel to evaluate for infectious or metabolic etiology of disease.  Troponins x2 Urinalysis with reflex culture ordered to evaluate for UTI or relevant urologic/nephrologic pathology.  CXR to evaluate for structural/infectious intrathoracic pathology.  EKG to evaluate for cardiac pathology.  Objective evaluation as below reviewed after administration of IVF/Telemetry monitoring  Initial Study Results:   Laboratory  All laboratory results reviewed without evidence of clinically relevant pathology.   Exceptions include: Cardiac troponin is mildly elevated but flat and downtrending at 20 and 18, magnesium mildly elevated at 2.6, CBC without a leukocytosis, mild anemia baseline 11.9, D-dimer negative, CMP without electrolyte abnormality, normal renal and liver function, urinalysis without hematuria or evidence of UTI.  EKG EKG was reviewed independently. Rate, rhythm, axis, intervals all examined and without medically relevant abnormality. ST segments without concerns for elevations.    Radiology:  All images reviewed independently. Agree with radiology report at this time.   CT Angio Chest/Abd/Pel for Dissection W and/or Wo Contrast Result Date: 07/29/2023 CLINICAL DATA:  Acute aortic syndrome (AAS) suspected.  Weakness EXAM: CT ANGIOGRAPHY CHEST, ABDOMEN AND PELVIS TECHNIQUE: Non-contrast CT of the chest was initially obtained. Multidetector CT imaging through the chest, abdomen and pelvis was performed using the standard protocol during bolus administration of intravenous contrast. Multiplanar  reconstructed images and MIPs were obtained and reviewed to evaluate the vascular anatomy. RADIATION DOSE REDUCTION: This exam was performed according to the departmental dose-optimization program which includes automated exposure control, adjustment of the mA and/or kV according to patient size and/or use of iterative reconstruction technique. CONTRAST:  OMNIPAQUE  IOHEXOL  350 MG/ML SOLN COMPARISON:  09/19/2022 FINDINGS: CTA CHEST FINDINGS Cardiovascular: Cardiomegaly. No evidence of aortic aneurysm or dissection. No filling defects in the pulmonary arteries to suggest pulmonary emboli. Mediastinum/Nodes: No mediastinal, hilar, or axillary adenopathy. Trachea and esophagus are unremarkable. Thyroid unremarkable. Moderate sized hiatal hernia. Lungs/Pleura: 5 mm Peri fissural nodule on the left on image 32. No confluent opacities or effusions. Musculoskeletal: Chest wall soft tissues are unremarkable. Slight compression fracture at T7. Mild compression fracture at T11. These are stable since prior study. Review of the MIP images confirms the above findings. CTA ABDOMEN AND PELVIS FINDINGS VASCULAR  Aorta: Normal caliber aorta without aneurysm, dissection, vasculitis or significant stenosis. Aortic atherosclerosis. Celiac: Mild narrowing at the origin of the celiac artery, likely median arcuate ligament impression. No aneurysm or dissection. SMA: Patent without evidence of aneurysm, dissection, vasculitis or significant stenosis. Renals: Both renal arteries are patent without evidence of aneurysm, dissection, vasculitis, fibromuscular dysplasia or significant stenosis. IMA: Patent without evidence of aneurysm, dissection, vasculitis or significant stenosis. Inflow: Patent without evidence of aneurysm, dissection, vasculitis or significant stenosis. Scattered calcifications. Veins: No obvious venous abnormality within the limitations of this arterial phase study. Review of the MIP images confirms the above  findings. NON-VASCULAR Hepatobiliary: No suspicious focal hepatic abnormality. Gallbladder unremarkable. No biliary ductal dilatation. Pancreas: No focal abnormality or ductal dilatation. Spleen: No focal abnormality.  Normal size. Adrenals/Urinary Tract: No adrenal abnormality. No focal renal abnormality. No stones or hydronephrosis. Urinary bladder is unremarkable. Stomach/Bowel: Large stool burden throughout the colon. Sigmoid diverticulosis. No active diverticulitis. Stomach and small bowel decompressed. Lymphatic: No adenopathy Reproductive: Prior hysterectomy.  No adnexal masses. Other: No free fluid or free air. Musculoskeletal: No acute bony abnormality. Degenerative changes throughout the lumbar spine. Review of the MIP images confirms the above findings. IMPRESSION: No evidence of aortic aneurysm or dissection. No evidence of pulmonary embolus. Mild cardiomegaly. Moderate-sized hiatal hernia. 5 mm perifissural left lung nodule. No follow-up needed if patient is low-risk.This recommendation follows the consensus statement: Guidelines for Management of Incidental Pulmonary Nodules Detected on CT Images: From the Fleischner Society 2017; Radiology 2017; 284:228-243. Sigmoid diverticulosis. No active diverticulitis. Large stool burden throughout the colon. Electronically Signed   By: Janeece Mechanic M.D.   On: 07/29/2023 17:24   CT Head Wo Contrast Result Date: 07/29/2023 CLINICAL DATA:  Provided history: Facial paralysis/weakness. EXAM: CT HEAD WITHOUT CONTRAST TECHNIQUE: Contiguous axial images were obtained from the base of the skull through the vertex without intravenous contrast. RADIATION DOSE REDUCTION: This exam was performed according to the departmental dose-optimization program which includes automated exposure control, adjustment of the mA and/or kV according to patient size and/or use of iterative reconstruction technique. COMPARISON:  Head CT 03/18/2022. FINDINGS: Brain: Mild generalized cerebral  atrophy. 4 mm hyperdense focus within the medial left thalamus, new from the prior head CT of 03/18/2022 (series 5, image 29) (series 6, image 33). Known chronic cortical/subcortical infarct within the left occipital lobe (PCA vascular territory). Background patchy and ill-defined hypoattenuation within the cerebral white matter and bilateral deep gray nuclei, nonspecific but compatible with advanced chronic small vessel ischemic disease. No acute demarcated cortical infarct. No extra-axial fluid collection. No midline shift. Vascular: No hyperdense vessel.  Atherosclerotic calcifications. Skull: No calvarial fracture or aggressive osseous lesion. Sinuses/Orbits: No mass or acute finding within the imaged orbits. Minimal mucosal thickening within the right sphenoid sinus. Impression #1 called by telephone at the time of interpretation on 07/29/2023 at 1:21 pm to provider [facility] , who verbally acknowledged these results. IMPRESSION: 1. 4 mm hyperdense focus within the left thalamus, new from the prior head CT of 03/18/2022 and suspicious for a small acute parenchymal hemorrhage. Alternatively, this may reflect a new focus of mineralization or other hyperdense lesion. Imaging follow-up to resolution recommended. 2. Known chronic left PCA territory infarct. 3. Background advanced chronic small vessel ischemic disease. 4. Mild generalized cerebral atrophy. Electronically Signed   By: Bascom Lily D.O.   On: 07/29/2023 13:22   DG Chest Portable 1 View Result Date: 07/29/2023 CLINICAL DATA:  Weakness EXAM: PORTABLE CHEST 1 VIEW COMPARISON:  09/19/2022 FINDINGS: Single frontal view of the chest demonstrates a stable enlarged cardiac silhouette. There is patchy scratch at there is minimal consolidation at the left lung base, which could reflect atelectasis or airspace disease. No effusion or pneumothorax. No acute bony abnormalities. IMPRESSION: 1. Minimal patchy left basilar consolidation which may reflect  atelectasis or airspace disease. 2. Stable enlarged cardiac silhouette. Electronically Signed   By: Bobbye Burrow M.D.   On: 07/29/2023 11:56      Consults: Case discussed with Dr. Bonnita Buttner of on-call neurology who felt that CT findings were consistent with calcification, no concern for hemorrhage at this time.   Final Assessment and Plan:   The patient developed a brief episode of chest pain in the emergency department.  Her cardiac enzymes were only mildly elevated but flat and downtrending at 20 and 18.  She is not particularly hypertensive.  The chest discomfort resolved.  Suspect mild discomfort in the setting of either hypertension or hiatal hernia.  Low concern for ACS.  D-dimer negative, low concern for PE, CTA dissection study obtained.  EKG did not show acute ischemic changes.  And is overall well-appearing, asymptomatic at this time, neurologically intact.    CTA dissection study was obtained and showed no evidence of aortic dissection. IMPRESSION:  No evidence of aortic aneurysm or dissection. No evidence of  pulmonary embolus.    Mild cardiomegaly.    Moderate-sized hiatal hernia.    5 mm perifissural left lung nodule. No follow-up needed if patient  is low-risk.This recommendation follows the consensus statement:  Guidelines for Management of Incidental Pulmonary Nodules Detected  on CT Images: From the Fleischner Society 2017; Radiology 2017;  284:228-243.    Sigmoid diverticulosis. No active diverticulitis. Large stool burden  throughout the colon.   Overall reassuring workup at this time.  Symptoms consistent with likely near syncope.  Considered admission for observation versus discharge and the patient would prefer discharge at this time.  Return precautions provided as per below.  DC Instructions: Your workup was overall reassuring.  Your CT head showed a possible area of hemorrhage versus mineralization and on review, neurology felt that this was likely  mineralization.  You have no headache, no focal neurologic deficits.  You are maintained on Eliquis .  Continue to take that medication.  Return for any severe worsening neurodeficits, headache or any other concerns, episode of passing out.  You had a brief episode of chest pain in the emergency department which resolved and your cardiac enzymes were only mildly elevated at 20 and 18 and were downtrending, low concern for any heart attack.  Low concern for a blood clot in your chest.  Your CT showed a number of incidental findings, no acute concerns.  Your symptoms of chest discomfort could have been due to a hiatal hernia which was seen on CT imaging.  Considered admission for observation versus discharge and given the resolution of your symptoms and reassuring workup, will plan for discharge at this time.  Please follow-up with your PCP to discuss the results of your diagnostic testing.   Clinical Impression:  1. Near syncope   2. Chest pain, unspecified type   3. Hiatal hernia   4. Pulmonary nodule      Discharge    Final Clinical Impression(s) / ED Diagnoses Final diagnoses:  Near syncope  Chest pain, unspecified type  Hiatal hernia  Pulmonary nodule    Rx / DC Orders ED Discharge Orders     None  Rosealee Concha, MD 07/29/23 [phone]

## 2023-11-24 ENCOUNTER — Encounter: Payer: Self-pay | Admitting: Cardiology

## 2023-12-06 ENCOUNTER — Encounter: Payer: Self-pay | Admitting: Cardiology

## 2023-12-25 NOTE — Interval H&P Note (Signed)
 H&P reviewed. The patient was examined and there are no changes to the H&P.

## 2024-01-05 ENCOUNTER — Emergency Department (HOSPITAL_COMMUNITY)

## 2024-01-05 ENCOUNTER — Other Ambulatory Visit: Payer: Self-pay

## 2024-01-05 ENCOUNTER — Inpatient Hospital Stay (HOSPITAL_COMMUNITY)
Admission: EM | Admit: 2024-01-05 | Discharge: 2024-01-10 | DRG: 064 | Disposition: A | Discharge status: Skilled Nursing Facility | Source: Skilled Nursing Facility | Attending: Neurology | Admitting: Neurology

## 2024-01-05 DIAGNOSIS — I63541 Cerebral infarction due to unspecified occlusion or stenosis of right cerebellar artery: Secondary | ICD-10-CM | POA: Diagnosis not present

## 2024-01-05 DIAGNOSIS — R001 Bradycardia, unspecified: Secondary | ICD-10-CM | POA: Diagnosis present

## 2024-01-05 DIAGNOSIS — I10 Essential (primary) hypertension: Secondary | ICD-10-CM | POA: Diagnosis present

## 2024-01-05 DIAGNOSIS — R270 Ataxia, unspecified: Secondary | ICD-10-CM | POA: Diagnosis present

## 2024-01-05 DIAGNOSIS — G2581 Restless legs syndrome: Secondary | ICD-10-CM | POA: Diagnosis present

## 2024-01-05 DIAGNOSIS — I610 Nontraumatic intracerebral hemorrhage in hemisphere, subcortical: Principal | ICD-10-CM

## 2024-01-05 DIAGNOSIS — I6930 Unspecified sequelae of cerebral infarction: Secondary | ICD-10-CM | POA: Diagnosis not present

## 2024-01-05 DIAGNOSIS — I951 Orthostatic hypotension: Secondary | ICD-10-CM | POA: Diagnosis present

## 2024-01-05 DIAGNOSIS — E785 Hyperlipidemia, unspecified: Secondary | ICD-10-CM | POA: Diagnosis present

## 2024-01-05 DIAGNOSIS — E871 Hypo-osmolality and hyponatremia: Secondary | ICD-10-CM | POA: Diagnosis present

## 2024-01-05 DIAGNOSIS — Z7901 Long term (current) use of anticoagulants: Secondary | ICD-10-CM

## 2024-01-05 DIAGNOSIS — I619 Nontraumatic intracerebral hemorrhage, unspecified: Principal | ICD-10-CM | POA: Diagnosis present

## 2024-01-05 DIAGNOSIS — R339 Retention of urine, unspecified: Secondary | ICD-10-CM | POA: Diagnosis present

## 2024-01-05 DIAGNOSIS — Z79890 Hormone replacement therapy (postmenopausal): Secondary | ICD-10-CM

## 2024-01-05 DIAGNOSIS — G8194 Hemiplegia, unspecified affecting left nondominant side: Secondary | ICD-10-CM | POA: Diagnosis present

## 2024-01-05 DIAGNOSIS — I618 Other nontraumatic intracerebral hemorrhage: Secondary | ICD-10-CM | POA: Diagnosis present

## 2024-01-05 DIAGNOSIS — Z79899 Other long term (current) drug therapy: Secondary | ICD-10-CM

## 2024-01-05 DIAGNOSIS — I63542 Cerebral infarction due to unspecified occlusion or stenosis of left cerebellar artery: Secondary | ICD-10-CM | POA: Diagnosis present

## 2024-01-05 DIAGNOSIS — N179 Acute kidney failure, unspecified: Secondary | ICD-10-CM | POA: Diagnosis present

## 2024-01-05 DIAGNOSIS — I739 Peripheral vascular disease, unspecified: Secondary | ICD-10-CM | POA: Diagnosis not present

## 2024-01-05 DIAGNOSIS — I6389 Other cerebral infarction: Secondary | ICD-10-CM | POA: Diagnosis not present

## 2024-01-05 DIAGNOSIS — R29707 NIHSS score 7: Secondary | ICD-10-CM

## 2024-01-05 DIAGNOSIS — K5900 Constipation, unspecified: Secondary | ICD-10-CM | POA: Diagnosis present

## 2024-01-05 DIAGNOSIS — H5461 Unqualified visual loss, right eye, normal vision left eye: Secondary | ICD-10-CM | POA: Diagnosis present

## 2024-01-05 DIAGNOSIS — R29706 NIHSS score 6: Secondary | ICD-10-CM | POA: Diagnosis not present

## 2024-01-05 DIAGNOSIS — I160 Hypertensive urgency: Secondary | ICD-10-CM | POA: Diagnosis not present

## 2024-01-05 DIAGNOSIS — M069 Rheumatoid arthritis, unspecified: Secondary | ICD-10-CM | POA: Diagnosis present

## 2024-01-05 DIAGNOSIS — Z888 Allergy status to other drugs, medicaments and biological substances status: Secondary | ICD-10-CM

## 2024-01-05 DIAGNOSIS — R29810 Facial weakness: Secondary | ICD-10-CM | POA: Diagnosis present

## 2024-01-05 DIAGNOSIS — I4891 Unspecified atrial fibrillation: Secondary | ICD-10-CM | POA: Diagnosis present

## 2024-01-05 DIAGNOSIS — F0150 Vascular dementia without behavioral disturbance: Secondary | ICD-10-CM | POA: Diagnosis present

## 2024-01-05 DIAGNOSIS — I480 Paroxysmal atrial fibrillation: Secondary | ICD-10-CM | POA: Diagnosis present

## 2024-01-05 DIAGNOSIS — I161 Hypertensive emergency: Secondary | ICD-10-CM

## 2024-01-05 DIAGNOSIS — Z9842 Cataract extraction status, left eye: Secondary | ICD-10-CM

## 2024-01-05 DIAGNOSIS — R471 Dysarthria and anarthria: Secondary | ICD-10-CM | POA: Diagnosis present

## 2024-01-05 LAB — DIFFERENTIAL
Abs Immature Granulocytes: 0.02 K/uL (ref 0.00–0.07)
Basophils Absolute: 0.1 K/uL (ref 0.0–0.1)
Basophils Relative: 1 %
Eosinophils Absolute: 0.1 K/uL (ref 0.0–0.5)
Eosinophils Relative: 2 %
Immature Granulocytes: 0 %
Lymphocytes Relative: 23 %
Lymphs Abs: 1.4 K/uL (ref 0.7–4.0)
Monocytes Absolute: 0.6 K/uL (ref 0.1–1.0)
Monocytes Relative: 9 %
Neutro Abs: 3.8 K/uL (ref 1.7–7.7)
Neutrophils Relative %: 65 %

## 2024-01-05 LAB — ETHANOL: Alcohol, Ethyl (B): <15 mg/dL (ref <15)

## 2024-01-05 LAB — I-STAT CHEM 8, ED
BUN: 24 mg/dL — ABNORMAL HIGH (ref 8–23)
Calcium, Ion: 1.15 mmol/L (ref 1.15–1.40)
Chloride: 98 mmol/L (ref 98–111)
Creatinine, Ser: 1.3 mg/dL — ABNORMAL HIGH (ref 0.44–1.00)
Glucose, Bld: 100 mg/dL — ABNORMAL HIGH (ref 70–99)
HCT: 37 % (ref 36.0–46.0)
Hemoglobin: 12.6 g/dL (ref 12.0–15.0)
Potassium: 4 mmol/L (ref 3.5–5.1)
Sodium: 137 mmol/L (ref 135–145)
TCO2: 26 mmol/L (ref 22–32)

## 2024-01-05 LAB — COMPREHENSIVE METABOLIC PANEL WITH GFR
ALT: 34 U/L (ref 0–44)
AST: 36 U/L (ref 15–41)
Albumin: 3.7 g/dL (ref 3.5–5.0)
Alkaline Phosphatase: 59 U/L (ref 38–126)
Anion gap: 12 (ref 5–15)
BUN: 21 mg/dL (ref 8–23)
CO2: 25 mmol/L (ref 22–32)
Calcium: 8.8 mg/dL — ABNORMAL LOW (ref 8.9–10.3)
Chloride: 97 mmol/L — ABNORMAL LOW (ref 98–111)
Creatinine, Ser: 1.19 mg/dL — ABNORMAL HIGH (ref 0.44–1.00)
GFR, Estimated: 46 mL/min — ABNORMAL LOW (ref >60)
Glucose, Bld: 100 mg/dL — ABNORMAL HIGH (ref 70–99)
Potassium: 3.8 mmol/L (ref 3.5–5.1)
Sodium: 134 mmol/L — ABNORMAL LOW (ref 135–145)
Total Bilirubin: 0.9 mg/dL (ref 0.0–1.2)
Total Protein: 7.2 g/dL (ref 6.5–8.1)

## 2024-01-05 LAB — PROTIME-INR
INR: 1.1 (ref 0.8–1.2)
Prothrombin Time: 14.4 s (ref 11.4–15.2)

## 2024-01-05 LAB — APTT: aPTT: 38 s — ABNORMAL HIGH (ref 24–36)

## 2024-01-05 LAB — CBC
HCT: 36.5 % (ref 36.0–46.0)
Hemoglobin: 11.7 g/dL — ABNORMAL LOW (ref 12.0–15.0)
MCH: 30.4 pg (ref 26.0–34.0)
MCHC: 32.1 g/dL (ref 30.0–36.0)
MCV: 94.8 fL (ref 80.0–100.0)
Platelets: 160 K/uL (ref 150–400)
RBC: 3.85 MIL/uL — ABNORMAL LOW (ref 3.87–5.11)
RDW: 15.8 % — ABNORMAL HIGH (ref 11.5–15.5)
WBC: 6 K/uL (ref 4.0–10.5)
nRBC: 0 % (ref 0.0–0.2)

## 2024-01-05 LAB — CBG MONITORING, ED: Glucose-Capillary: 95 mg/dL (ref 70–99)

## 2024-01-05 MED ORDER — SODIUM CHLORIDE 0.9% FLUSH: 3.0000 mL | Freq: Once | INTRAVENOUS | Status: DC

## 2024-01-05 MED ORDER — EMPTY CONTAINERS FLEXIBLE MISC
900.0000 mg | Freq: Once | Status: AC
  Administered 2024-01-05: 900 mg via INTRAVENOUS
  Filled 2024-01-05: qty 90

## 2024-01-05 MED ORDER — SENNOSIDES-DOCUSATE SODIUM 8.6-50 MG PO TABS
1.0000 | ORAL_TABLET | Freq: Two times a day (BID) | ORAL | Status: DC
  Administered 2024-01-06 – 2024-01-10 (×9): 1 via ORAL
  Filled 2024-01-05 (×10): qty 1

## 2024-01-05 MED ORDER — PANTOPRAZOLE SODIUM 40 MG IV SOLR
40.0000 mg | Freq: Every day | INTRAVENOUS | Status: DC
  Administered 2024-01-05 – 2024-01-06 (×2): 40 mg via INTRAVENOUS
  Filled 2024-01-05 (×2): qty 10

## 2024-01-05 MED ORDER — ACETAMINOPHEN 160 MG/5ML PO SOLN: 650.0000 mg | ORAL | Status: DC | PRN

## 2024-01-05 MED ORDER — HYDRALAZINE HCL 20 MG/ML IJ SOLN
10.0000 mg | Freq: Once | INTRAMUSCULAR | Status: AC
  Administered 2024-01-05: 10 mg via INTRAVENOUS

## 2024-01-05 MED ORDER — ACETAMINOPHEN 650 MG RE SUPP: 650.0000 mg | RECTAL | Status: DC | PRN

## 2024-01-05 MED ORDER — CLEVIDIPINE BUTYRATE 0.5 MG/ML IV EMUL
0.0000 mg/h | INTRAVENOUS | Status: DC
Start: 2024-01-05 — End: 2024-01-08
  Administered 2024-01-05: 2 mg/h via INTRAVENOUS
  Filled 2024-01-05: qty 100
  Filled 2024-01-05: qty 50

## 2024-01-05 MED ORDER — CLEVIDIPINE BUTYRATE 0.5 MG/ML IV EMUL: 0.0000 mg/h | INTRAVENOUS | Status: DC

## 2024-01-05 MED ORDER — STROKE: EARLY STAGES OF RECOVERY BOOK
Freq: Once | Status: AC
  Filled 2024-01-05: qty 1

## 2024-01-05 MED ORDER — IOHEXOL 350 MG/ML SOLN
75.0000 mL | Freq: Once | INTRAVENOUS | Status: AC | PRN
  Administered 2024-01-05: 75 mL via INTRAVENOUS

## 2024-01-05 MED ORDER — ACETAMINOPHEN 325 MG PO TABS
650.0000 mg | ORAL_TABLET | ORAL | Status: DC | PRN
  Administered 2024-01-05 – 2024-01-07 (×3): 650 mg via ORAL
  Filled 2024-01-05 (×3): qty 2

## 2024-01-05 NOTE — Code Documentation (Signed)
 Responded to Code Stroke called on pt admitted to the ED at 2020 for weakness. Code Stroke called at 2038 for L sided weakness/numbness, LSN-1730. CBG-95, NIH-7 for R sided hemianopia, L facial droop, L arm weakness/sensory/ataxia, and slurred speech. CT head- 1. Acute 1 cm hemorrhage in right thalamus.  No substantial mass effect. 2. New 1.2 cm hyperdensity in the inferior right cerebellum could represent additional hemorrhage versus mineralization. TNK not given-head bleed. Pt given 10mg  hydralazine IV at 2046 while in CT for BP-195/72. Pt taken back to ED room where cleviprexx was started at 2104 and 900mg  andexxa IV was given at 2120. Plan ICU admission. Please complete VS/neuro checks(including NIH) v8y.

## 2024-01-05 NOTE — ED Provider Notes (Signed)
 [place] EMERGENCY DEPARTMENT AT [facility] Provider Note   CSN: [id number] Arrival date & time: 01/05/24  2020     Patient presents with: Weakness   Sara Snow is a 83 y.o. female.   The history is provided by the patient and medical records. No language interpreter was used.  Weakness Severity:  Moderate Onset quality:  Sudden Duration:  3 hours (last normal 5:30pm) Progression:  Worsening Chronicity:  New Relieved by:  Nothing Worsened by:  Nothing Ineffective treatments:  None tried Associated symptoms: headaches, sensory-motor deficit and stroke symptoms   Associated symptoms: no abdominal pain, no aphasia, no chest pain, no cough, no diarrhea, no dizziness, no dysuria, no falls, no fever, no foul-smelling urine, no loss of consciousness, no nausea, no seizures, no shortness of breath and no vomiting        Prior to Admission medications   Medication Sig Start Date End Date Taking? Authorizing Provider  acetaminophen  (TYLENOL ) 650 MG CR tablet Take 1,300 mg by mouth in the morning, at noon, and at bedtime. 04/21/22   [provider]  amiodarone  (PACERONE ) 200 MG tablet Take 1 tablet (200 mg total) by mouth 2 (two) times daily for 30 days, THEN 1 tablet (200 mg total) daily. Patient taking differently: 1 tablet (200 mg total) daily. 12/05/22 01/04/24  [name], PA-C  apixaban  (ELIQUIS ) 2.5 MG TABS tablet Take 1 tablet (2.5 mg total) by mouth 2 (two) times daily. 09/22/22   Rojelio Nest, DO  atorvastatin  (LIPITOR) 40 MG tablet Take 40 mg by mouth daily with supper. 01/31/22   [provider]  Calcium  Citrate-Vitamin D 315-5 MG-MCG TABS Take 1 tablet by mouth daily. 06/28/16   [provider]  Carboxymethylcellulose Sod PF (REFRESH PLUS) 0.5 % SOLN Place 1 drop into both eyes 6 (six) times daily. 06/28/16   [provider]  folic acid  (FOLVITE ) 1 MG tablet Take 1 mg by mouth daily. 07/19/21   [provider]   furosemide  (LASIX ) 20 MG tablet Take 1 tablet (20 mg total) by mouth daily. May take extra tablet daily for wt gain/swelling 01/10/23   Fenton, Clint R, PA  gabapentin  (NEURONTIN ) 100 MG capsule Take 100-300 mg by mouth at bedtime. For restless leg 10/11/21   [provider]  KLOR-CON  20 MEQ packet Take 20 mEq by mouth daily. 11/30/22   [provider]  levothyroxine  (SYNTHROID ) 75 MCG tablet Take 75 mcg by mouth daily before breakfast. 11/28/22   [provider]  metoprolol  succinate (TOPROL -XL) 25 MG 24 hr tablet Take 0.5 tablets (12.5 mg total) by mouth daily. 01/27/23   [name], PA-C  metoprolol  tartrate (LOPRESSOR ) 25 MG tablet Take 1 tablet (25 mg total) by mouth daily as needed (HEART RATE OVER 120). 10/19/22 07/27/23  Wyn Jackee VEAR Mickey., NP  Multiple Vitamins-Minerals (PRESERVISION AREDS) CAPS Take 1 capsule by mouth in the morning and at bedtime. 12/29/15   [provider]  pantoprazole  (PROTONIX ) 40 MG tablet Take 1 tablet (40 mg total) by mouth daily. Patient taking differently: Take 40 mg by mouth every other day. 09/22/22   Rojelio Nest, DO  UNABLE TO FIND Take 750 mg by mouth daily. Bone Restore - Life Extension: take 3 tablets daily    [provider]  Vitamin D, Ergocalciferol, (DRISDOL) 1.25 MG (50000 UNIT) CAPS capsule Take 50,000 Units by mouth every 7 (seven) days. Sunday 12/14/21   [provider]    Allergies: Plaquenil [hydroxychloroquine], Reglan [metoclopramide],  Amlodipine-valsartan-hctz, and Celebrex [celecoxib]    Review of Systems  Constitutional:  Negative for chills, fatigue and fever.  HENT:  Negative for congestion.   Eyes:  Negative for pain.  Respiratory:  Negative for cough, chest tightness and shortness of breath.   Cardiovascular:  Negative for chest pain.  Gastrointestinal:  Negative for abdominal pain, constipation, diarrhea, nausea and vomiting.  Genitourinary:  Negative for dysuria.   Musculoskeletal:  Negative for back pain, falls and neck pain.  Skin:  Negative for rash and wound.  Neurological:  Positive for facial asymmetry, weakness, numbness and headaches. Negative for dizziness, seizures, loss of consciousness, speech difficulty and light-headedness.  Psychiatric/Behavioral:  Negative for agitation and confusion.   All other systems reviewed and are negative.   Updated Vital Signs BP (!) 190/108 (BP Location: Left Arm)   Pulse (!) 55   Resp 16   SpO2 100%   Physical Exam Vitals and nursing note reviewed.  Constitutional:      General: She is not in acute distress.    Appearance: She is well-developed. She is not toxic-appearing or diaphoretic.  HENT:     Head: Normocephalic and atraumatic.     Right Ear: External ear normal.     Left Ear: External ear normal.     Nose: Nose normal.     Mouth/Throat:     Mouth: Mucous membranes are moist.     Pharynx: No oropharyngeal exudate.  Eyes:     Conjunctiva/sclera: Conjunctivae normal.     Pupils: Pupils are equal, round, and reactive to light.  Cardiovascular:     Rate and Rhythm: Bradycardia present.  Pulmonary:     Effort: No respiratory distress.     Breath sounds: No stridor. No wheezing, rhonchi or rales.  Chest:     Chest wall: No tenderness.  Abdominal:     General: There is no distension.     Tenderness: There is no abdominal tenderness. There is no rebound.  Musculoskeletal:     Cervical back: Normal range of motion and neck supple.  Skin:    General: Skin is warm.     Findings: No erythema or rash.  Neurological:     Mental Status: She is alert.     Cranial Nerves: Facial asymmetry present.     Sensory: Sensory deficit present.     Motor: Weakness present. No abnormal muscle tone.     Coordination: Coordination abnormal. Finger-Nose-Finger Test abnormal.     Deep Tendon Reflexes: Reflexes are normal and symmetric.     Comments: Left face, left arm and left leg numbness compared to  right.  Left facial droop.  Left arm and left leg weakness compared to right.  Difficulty with finger-nose-finger testing on left arm.     (all labs ordered are listed, but only abnormal results are displayed) Labs Reviewed  APTT - Abnormal; Notable for the following components:      Result Value   aPTT 38 (*)    All other components within normal limits  CBC - Abnormal; Notable for the following components:   RBC 3.85 (*)    Hemoglobin 11.7 (*)    RDW 15.8 (*)    All other components within normal limits  COMPREHENSIVE METABOLIC PANEL WITH GFR - Abnormal; Notable for the following components:   Sodium 134 (*)    Chloride 97 (*)    Glucose, Bld 100 (*)    Creatinine, Ser 1.19 (*)    Calcium  8.8 (*)  GFR, Estimated 46 (*)    All other components within normal limits  I-STAT CHEM 8, ED - Abnormal; Notable for the following components:   BUN 24 (*)    Creatinine, Ser 1.30 (*)    Glucose, Bld 100 (*)    All other components within normal limits  PROTIME-INR  DIFFERENTIAL  ETHANOL  CBG MONITORING, ED  CBG MONITORING, ED    EKG: None  Radiology: CT HEAD CODE STROKE WO CONTRAST Result Date: 01/05/2024 EXAM: CT HEAD WITHOUT 01/05/2024 08:44:03 PM TECHNIQUE: CT of the head was performed without the administration of intravenous contrast. Automated exposure control, iterative reconstruction, and/or weight based adjustment of the mA/kV was utilized to reduce the radiation dose to as low as reasonably achievable. COMPARISON: CT head Jul 29, 2023 CLINICAL HISTORY: Neuro deficit, acute, stroke suspected. FINDINGS: BRAIN AND VENTRICLES: Acute 0.9 x 0.8 x 1.0 cm intraparenchymal hemorrhage in the right thalamus. Mild surrounding edema without substantial mass effect. New 1.2 cm hyperdensity in the inferior right cerebellum could represent additional hemorrhage versus mineralization. No mass effect or midline shift. No extra-axial fluid collection. No evidence of acute large vascular  territory infarct. Remote left PCA territory infarct. Patchy white matter hypodensities, compatible with chronic microvascular ischemic change. No hydrocephalus. ORBITS: No acute abnormality. SINUSES AND MASTOIDS: No acute abnormality. SOFT TISSUES AND SKULL: No acute skull fracture. Other: Dr. Michaela paged at the time of dictation for call of report. Awaiting call back. IMPRESSION: 1. Acute 1 cm hemorrhage in right thalamus.  No substantial mass effect. 2. New 1.2 cm hyperdensity in the inferior right cerebellum could represent additional hemorrhage versus mineralization. Recommend attention on follow up. 3. Remote left PCA territory infarct and chronic microvascular ischemic change. Electronically signed by: Gilmore Molt MD 01/05/2024 08:59 PM EDT RP Workstation: [id number]     Procedures   CRITICAL CARE Performed by: Lonni PARAS Dannae Kato Total critical care time: 35 minutes Critical care time was exclusive of separately billable procedures and treating other patients. Critical care was necessary to treat or prevent imminent or life-threatening deterioration. Critical care was time spent personally by me on the following activities: development of treatment plan with patient and/or surrogate as well as nursing, discussions with consultants, evaluation of patient's response to treatment, examination of patient, obtaining history from patient or surrogate, ordering and performing treatments and interventions, ordering and review of laboratory studies, ordering and review of radiographic studies, pulse oximetry and re-evaluation of patient's condition.  Medications Ordered in the ED  sodium chloride  flush (NS) 0.9 % injection 3 mL (has no administration in time range)  clevidipine (CLEVIPREX) infusion 0.5 mg/mL (has no administration in time range)  hydrALAZINE (APRESOLINE) injection 10 mg (has no administration in time range)  coag fact Xa recombinant (ANDEXXA) low dose infusion 900 mg (has  no administration in time range)   stroke: early stages of recovery book (has no administration in time range)  acetaminophen  (TYLENOL ) tablet 650 mg (has no administration in time range)    Or  acetaminophen  (TYLENOL ) 160 MG/5ML solution 650 mg (has no administration in time range)    Or  acetaminophen  (TYLENOL ) suppository 650 mg (has no administration in time range)  senna-docusate (Senokot-S) tablet 1 tablet (has no administration in time range)  pantoprazole  (PROTONIX ) injection 40 mg (has no administration in time range)  iohexol  (OMNIPAQUE ) 350 MG/ML injection 75 mL (75 mLs Intravenous Contrast Given 01/05/24 2055)  Medical Decision Making Amount and/or Complexity of Data Reviewed Labs: ordered. Radiology: ordered.  Risk Prescription drug management. Decision regarding hospitalization.    Cyleigh Massaro is a 83 y.o. female with a past medical history significant for atrial fibrillation on Eliquis  therapy, previous stroke, arthritis, GERD, and thyroid disease who presents for neurologic deficits.  According to patient, she was last normal at 5:30 PM when she was eating dinner and started dropping her fork from her left hand.  She reports her left leg felt different and she presents evaluation.  She says that she is having a mild headache but denies fall or trauma.  Denies any fevers, chills, congestion, cough, nausea, vomiting, constipation, diarrhea, or urinary changes.  She reports chronic vision changes that are not any different.  On my initial evaluation, patient has left facial droop, left arm drift, and left leg weakness compared to the right.  She also has numbness in her left leg left face and left arm compared to right.  Pupil exam is abnormal because she has minimal vision from her right eye and her left eye is also poor vision.    Clinically I am concerned about stroke and a code stroke was activated.  She tells me she had no  residual stroke symptoms from her previous stroke.  Initial glucose was 95 here.  Patient taken to CT scanner for code stroke evaluation.  On initial imaging I am concerned about intracerebral hemorrhage.  Patient appears to have 2 separate areas of intraparenchymal hemorrhage in her right cerebellum and her right thalamic area on my review of the images.  Patient blood pressure around 200 and was started on Cleviprex.  She will be admitted to neuro ICU for further management of hemorrhagic strokes.       Final diagnoses:  Right-sided nontraumatic intracerebral hemorrhage, unspecified cerebral location [facility])    Clinical Impression: 1. Right-sided nontraumatic intracerebral hemorrhage, unspecified cerebral location [facility])     Disposition: Admit  This note was prepared with assistance of Dragon voice recognition software. Occasional wrong-word or sound-a-like substitutions may have occurred due to the inherent limitations of voice recognition software.      Sara Snow, Lonni PARAS, MD 01/05/24 2230

## 2024-01-05 NOTE — ED Triage Notes (Signed)
 BIBEMS from [place] for weakness since 5:30pm. Pt having weakness and numbness to L side.

## 2024-01-06 ENCOUNTER — Inpatient Hospital Stay (HOSPITAL_COMMUNITY)

## 2024-01-06 ENCOUNTER — Encounter (HOSPITAL_COMMUNITY): Payer: Self-pay | Admitting: Neurology

## 2024-01-06 DIAGNOSIS — I6389 Other cerebral infarction: Secondary | ICD-10-CM | POA: Diagnosis not present

## 2024-01-06 DIAGNOSIS — I739 Peripheral vascular disease, unspecified: Secondary | ICD-10-CM

## 2024-01-06 DIAGNOSIS — I10 Essential (primary) hypertension: Secondary | ICD-10-CM

## 2024-01-06 DIAGNOSIS — E785 Hyperlipidemia, unspecified: Secondary | ICD-10-CM

## 2024-01-06 DIAGNOSIS — I63541 Cerebral infarction due to unspecified occlusion or stenosis of right cerebellar artery: Secondary | ICD-10-CM

## 2024-01-06 LAB — ECHOCARDIOGRAM COMPLETE
AR max vel: 2.38 cm2
AV Peak grad: 6.6 mmHg
Ao pk vel: 1.28 m/s
Area-P 1/2: 2.99 cm2
S' Lateral: 2.4 cm

## 2024-01-06 LAB — GLUCOSE, CAPILLARY: Glucose-Capillary: 118 mg/dL — ABNORMAL HIGH (ref 70–99)

## 2024-01-06 LAB — MRSA NEXT GEN BY PCR, NASAL: MRSA by PCR Next Gen: NOT DETECTED

## 2024-01-06 MED ORDER — ATORVASTATIN CALCIUM 40 MG PO TABS
40.0000 mg | ORAL_TABLET | Freq: Every day | ORAL | Status: DC
  Administered 2024-01-06 – 2024-01-09 (×4): 40 mg via ORAL
  Filled 2024-01-06 (×4): qty 1

## 2024-01-06 MED ORDER — CHLORHEXIDINE GLUCONATE CLOTH 2 % EX PADS
6.0000 | MEDICATED_PAD | Freq: Every day | CUTANEOUS | Status: DC
  Administered 2024-01-06 – 2024-01-10 (×5): 6 via TOPICAL

## 2024-01-06 MED ORDER — PREDNISOLONE ACETATE 1 % OP SUSP
2.0000 [drp] | Freq: Four times a day (QID) | OPHTHALMIC | Status: DC
  Administered 2024-01-06 – 2024-01-09 (×14): 2 [drp] via OPHTHALMIC
  Filled 2024-01-06: qty 5

## 2024-01-06 MED ORDER — LEVOTHYROXINE SODIUM 75 MCG PO TABS
75.0000 ug | ORAL_TABLET | Freq: Every day | ORAL | Status: DC
  Administered 2024-01-06 – 2024-01-10 (×5): 75 ug via ORAL
  Filled 2024-01-06 (×5): qty 1

## 2024-01-06 MED ORDER — GABAPENTIN 100 MG PO CAPS: 100.0000 mg | ORAL_CAPSULE | Freq: Every day | ORAL | Status: DC | PRN

## 2024-01-06 MED ORDER — GABAPENTIN 100 MG PO CAPS
200.0000 mg | ORAL_CAPSULE | Freq: Every day | ORAL | Status: DC
  Administered 2024-01-06 – 2024-01-09 (×4): 200 mg via ORAL
  Filled 2024-01-06 (×4): qty 2

## 2024-01-06 MED ORDER — SODIUM CHLORIDE 0.9 % IV BOLUS
500.0000 mL | Freq: Once | INTRAVENOUS | Status: AC
  Administered 2024-01-06: 500 mL via INTRAVENOUS

## 2024-01-06 MED ORDER — DORZOLAMIDE HCL-TIMOLOL MAL 2-0.5 % OP SOLN
1.0000 [drp] | Freq: Two times a day (BID) | OPHTHALMIC | Status: DC
  Administered 2024-01-06 – 2024-01-10 (×9): 1 [drp] via OPHTHALMIC
  Filled 2024-01-06: qty 10

## 2024-01-06 MED ORDER — METOPROLOL SUCCINATE ER 25 MG PO TB24
12.5000 mg | ORAL_TABLET | Freq: Every day | ORAL | Status: DC
Start: 2024-01-06 — End: 2024-01-08
  Administered 2024-01-06 – 2024-01-08 (×2): 12.5 mg via ORAL
  Filled 2024-01-06 (×2): qty 1

## 2024-01-06 MED ORDER — AMIODARONE HCL 200 MG PO TABS
200.0000 mg | ORAL_TABLET | Freq: Every day | ORAL | Status: DC
  Administered 2024-01-06 – 2024-01-10 (×5): 200 mg via ORAL
  Filled 2024-01-06 (×5): qty 1

## 2024-01-06 MED ORDER — ORAL CARE MOUTH RINSE: 15.0000 mL | OROMUCOSAL | Status: DC | PRN

## 2024-01-06 MED ORDER — POLYVINYL ALCOHOL 1.4 % OP SOLN
1.0000 [drp] | OPHTHALMIC | Status: DC | PRN
  Administered 2024-01-07 – 2024-01-08 (×3): 1 [drp] via OPHTHALMIC
  Filled 2024-01-06: qty 15

## 2024-01-06 MED ORDER — HYDRALAZINE HCL 20 MG/ML IJ SOLN
20.0000 mg | INTRAMUSCULAR | Status: DC | PRN
  Administered 2024-01-06 – 2024-01-07 (×3): 20 mg via INTRAVENOUS
  Filled 2024-01-06 (×3): qty 1

## 2024-01-06 NOTE — Progress Notes (Signed)
 SLP Cancellation Note  Patient Details Name: Sara Snow MRN: [id number] DOB: November 14, 1940   Cancelled treatment:       Reason Eval/Treat Not Completed: Patient at procedure or test/unavailable;pt with echo when SLE attempted.  Will continue efforts.   Pat Maniah Nading,M.S.,CCC-SLP 01/06/2024, 12:47 PM

## 2024-01-06 NOTE — H&P (Addendum)
 NEUROLOGY H&P NOTE   Date of service: 01/05/24 Patient Name: Sara Snow MRN:  [id number] DOB:  [birth year] Chief Complaint: Left-sided weakness  History of Present Illness  Sara Snow is a 83 y.o. female with hx of previous stroke as well as atrial fibrillation on anticoagulation with Eliquis  who was in her normal state of health until she noticed that she was dropping things about 5:30 PM.  When she was seen in the emergency department, a code stroke was activated and she was evaluated with head CT which demonstrates two areas of hyperintensity concerning for hemorrhage.  The right thalamic lesion seems most consistent with her acute symptoms, but the cerebellar lesion is new since May and could very well represent a second focus of intraparenchymal hemorrhage.  She denies any antecedent symptoms, any recent illnesses.     Last known well: 5:30 PM Modified rankin score: 2-Slight disability-UNABLE to perform all activities but does not need assistance ICH Score: 2 tNKASE: Not offered due to ICH Thrombectomy: not offered due to ICH NIHSS: 7    Past History   Past Medical History:  Diagnosis Date   GERD (gastroesophageal reflux disease)    Hypertension    RA (rheumatoid arthritis) (HCC)    Restless leg syndrome    Stroke [facility])    SVT (supraventricular tachycardia)    Thyroid disease    Past Surgical History:  Procedure Laterality Date   BIOPSY  09/21/2022   Procedure: BIOPSY;  Surgeon: Stacia Glendia BRAVO, MD;  Location: [facility] ENDOSCOPY;  Service: Gastroenterology;;   CARDIOVERSION N/A 10/26/2022   Procedure: CARDIOVERSION;  Surgeon: Sheena Pugh, DO;  Location: [facility] INVASIVE CV LAB;  Service: Cardiovascular;  Laterality: N/A;   CARDIOVERSION N/A 01/19/2023   Procedure: CARDIOVERSION;  Surgeon: Michele Richardson, DO;  Location: [facility] INVASIVE CV LAB;  Service: Cardiovascular;  Laterality: N/A;   ESOPHAGOGASTRODUODENOSCOPY (EGD) WITH PROPOFOL  N/A 09/21/2022   Procedure:  ESOPHAGOGASTRODUODENOSCOPY (EGD) WITH PROPOFOL ;  Surgeon: Stacia Glendia BRAVO, MD;  Location: [facility] ENDOSCOPY;  Service: Gastroenterology;  Laterality: N/A;   No family history on file. Social History   Socioeconomic History   Marital status: Married    Spouse name: Not on file   Number of children: Not on file   Years of education: Not on file   Highest education level: Not on file  Occupational History   Not on file  Tobacco Use   Smoking status: Never    Passive exposure: Never   Smokeless tobacco: Never  Vaping Use   Vaping status: Never Used  Substance and Sexual Activity   Alcohol  use: Yes    Comment: occ   Drug use: Not Currently   Sexual activity: Not on file  Other Topics Concern   Not on file  Social History Narrative   Not on file   Social Drivers of Health   Financial Resource Strain: Not on file  Food Insecurity: Patient Declined (09/20/2022)   Hunger Vital Sign    Worried About Running Out of Food in the Last Year: Patient declined    Ran Out of Food in the Last Year: Patient declined  Transportation Needs: No Transportation Needs (09/20/2022)   PRAPARE - Administrator, Civil Service (Medical): No    Lack of Transportation (Non-Medical): No  Physical Activity: Not on file  Stress: Not on file  Social Connections: Not on file   Allergies  Allergen Reactions   Plaquenil [Hydroxychloroquine] Other (See Comments)    Toxicity caused bulls eye vision  Reglan [Metoclopramide] Other (See Comments)    nightmares   Amlodipine-Valsartan-Hctz Rash   Celebrex [Celecoxib] Rash    Total body rash    Medications  (Not in a hospital admission)    Vitals   Vitals:   01/06/24 0330 01/06/24 0335 01/06/24 0340 01/06/24 0345  BP: 129/67 128/68 128/67 122/63  Pulse: 70 70 69 64  Resp: 19 16 19 17   Temp:      SpO2: 98% 100% 99% 98%     There is no height or weight on file to calculate BMI.  Physical Exam   Constitutional: Appears well-developed  and well-nourished.  Neurologic Examination   Neuro: Mental Status: Patient is awake, alert, oriented to person, place, month, year, and situation. Patient is able to give a clear and coherent history. No signs of aphasia or neglect Cranial Nerves: II: Visual Fields are full. Pupils are equal, round, and reactive to light.   III,IV, VI: EOMI without ptosis or diploplia.  VII: Facial movement with left facial weakness.  VIII: hearing is intact to voice X: Uvula elevates symmetrically XII: tongue is midline without atrophy or fasciculations.  Motor: Tone is normal. Bulk is normal. 5/5 strength was present on the right, 4/5 in the left arm and leg.  Sensory: Sensation is diminished on the left Cerebellar: FNF dysfunction in the left arm seems out of proportion to weakness but the leg is without clear ataxia        Labs   CBC:  Recent Labs  Lab 01/05/24 2038 01/05/24 2043  WBC 6.0  --   NEUTROABS 3.8  --   HGB 11.7* 12.6  HCT 36.5 37.0  MCV 94.8  --   PLT 160  --     Basic Metabolic Panel:  Lab Results  Component Value Date   NA 137 01/05/2024   K 4.0 01/05/2024   CO2 25 01/05/2024   GLUCOSE 100 (H) 01/05/2024   BUN 24 (H) 01/05/2024   CREATININE 1.30 (H) 01/05/2024   CALCIUM  8.8 (L) 01/05/2024   GFRNONAA 46 (L) 01/05/2024   Lipid Panel: No results found for: LDLCALC HgbA1c: No results found for: HGBA1C Urine Drug Screen: No results found for: LABOPIA, COCAINSCRNUR, LABBENZ, AMPHETMU, THCU, LABBARB  Alcohol  Level     Component Value Date/Time   [facility] <15 01/05/2024 2038   INR  Lab Results  Component Value Date   INR 1.1 01/05/2024   APTT  Lab Results  Component Value Date   APTT 38 (H) 01/05/2024     CT Head without contrast(Personally reviewed): Two areas of hyperdensity both in the right thalamus as well as the right cerebellum  CT angio Head and Neck with contrast(Personally reviewed): Negative   Impression   Sara Snow is a 83 y.o. female with thalamic hemorrhage in the setting of severe hypertension, most likely hypertensive in nature, though with multiple lesions, if the cerebellar lesion is hemorrhage, hemorrhagic conversion of ischemic strokes could also be a consideration.  Another possibility would be an initial hemorrhage, with resultant elevation of blood pressure leading to a second lesion.  I do think an MRI could be helpful.  Primary Diagnosis:  Nontraumatic intracerebral hemorrhage in hemisphere, subcortical  Secondary Diagnosis: Essential (primary) hypertension, Hypertension Emergency (SBP > 180 or DBP > 120 & end organ damage), and Paroxysmal atrial fibrillation  Recommendations  1) Admit to ICU 2) no antiplatelets or anticoagulants 3) blood pressure control with goal systolic 130 - 150, Cleviprex ordered 4) Frequent neuro checks 5)  If symptoms worsen or there is decreased mental status, repeat stat head CT 6) PT,OT,ST 7) continue home Synthroid  8) continue home amiodarone , metoprolol  for atrial fibrillation 9) continue home atorvastatin  for hyperlipidemia  ______________________________________________________________________   This patient is critically ill and at significant risk of neurological worsening, death and care requires constant monitoring of vital signs, hemodynamics,respiratory and cardiac monitoring, neurological assessment, discussion with family, other specialists and medical decision making of high complexity. I spent 55 minutes of neurocritical care time  in the care of  this patient. This was time spent independent of any time provided by nurse practitioner or PA.  Aisha Seals, MD [facility]   If 7pm- 7am, please page neurology on call as listed in AMION. 01/06/2024  4:04 AM

## 2024-01-06 NOTE — Progress Notes (Signed)
 Echocardiogram 2D Echocardiogram has been performed.  Sara Snow 01/06/2024, 9:49 AM

## 2024-01-06 NOTE — Progress Notes (Addendum)
 STROKE TEAM PROGRESS NOTE    SIGNIFICANT HOSPITAL EVENTS 10/24-admit to ICU due to ICH  INTERIM HISTORY/SUBJECTIVE Currently on clevipex. PRNs available  MRI shows a right thalamic hemorrhage and a left cerebellar infarct. Neuro exam and hemodynamically stable. Will likely need repeat imaging prior to resuming anticoagulation or ASA.   OBJECTIVE  CBC    Component Value Date/Time   WBC 6.0 01/05/2024 2038   RBC 3.85 (L) 01/05/2024 2038   HGB 12.6 01/05/2024 2043   HGB 12.3 06/05/2023 1502   HCT 37.0 01/05/2024 2043   HCT 38.2 06/05/2023 1502   PLT 160 01/05/2024 2038   PLT 196 06/05/2023 1502   MCV 94.8 01/05/2024 2038   MCV 96 06/05/2023 1502   MCH 30.4 01/05/2024 2038   MCHC 32.1 01/05/2024 2038   RDW 15.8 (H) 01/05/2024 2038   RDW 12.3 06/05/2023 1502   LYMPHSABS 1.4 01/05/2024 2038   LYMPHSABS 1.2 06/05/2023 1502   MONOABS 0.6 01/05/2024 2038   EOSABS 0.1 01/05/2024 2038   EOSABS 0.1 06/05/2023 1502   BASOSABS 0.1 01/05/2024 2038   BASOSABS 0.0 06/05/2023 1502    BMET    Component Value Date/Time   NA 137 01/05/2024 2043   NA 136 07/27/2023 1357   K 4.0 01/05/2024 2043   CL 98 01/05/2024 2043   CO2 25 01/05/2024 2038   GLUCOSE 100 (H) 01/05/2024 2043   BUN 24 (H) 01/05/2024 2043   BUN 23 07/27/2023 1357   CREATININE 1.30 (H) 01/05/2024 2043   CALCIUM  8.8 (L) 01/05/2024 2038   EGFR 52 (L) 07/27/2023 1357   GFRNONAA 46 (L) 01/05/2024 2038    IMAGING past 24 hours ECHOCARDIOGRAM COMPLETE Result Date: 01/06/2024    ECHOCARDIOGRAM REPORT   Patient Name:   Sara Snow Date of Exam: 01/06/2024 Medical Rec #:  [id number]         Height:       63.0 in Accession #:    [id number]        Weight:       120.0 lb Date of Birth:  [birth year]        BSA:          1.556 m Patient Age:    82 years          BP:           147/71 mmHg Patient Gender: F                 HR:           58 bpm. Exam Location:  Inpatient Procedure: 2D Echo, Cardiac Doppler and Color Doppler  (Both Spectral and Color            Flow Doppler were utilized during procedure). Indications:    Stroke I63.9  History:        Patient has prior history of Echocardiogram examinations, most                 recent 09/20/2022. CHF, Arrythmias:Atrial Fibrillation; Risk                 Factors:Hypertension.  Sonographer:    Thea Norlander RCS Referring Phys: MCNEILL P KIRKPATRICK IMPRESSIONS  1. Concerns for asymmetric hypertrophy of the apical segments but images could be foreshoretened. Would recommend repeat limited study with contrast or cardiac MRI for clarification. Left ventricular ejection fraction, by estimation, is 65 to 70%. The left ventricle has normal function. The left ventricle has no regional wall motion abnormalities. There  is moderate concentric left ventricular hypertrophy. Left ventricular diastolic parameters are consistent with Grade I diastolic dysfunction (impaired  relaxation).  2. Right ventricular systolic function is normal. The right ventricular size is normal. Tricuspid regurgitation signal is inadequate for assessing PA pressure.  3. The mitral valve is grossly normal. Trivial mitral valve regurgitation. No evidence of mitral stenosis.  4. The aortic valve is tricuspid. Aortic valve regurgitation is trivial. No aortic stenosis is present.  5. The inferior vena cava is normal in size with greater than 50% respiratory variability, suggesting right atrial pressure of 3 mmHg. Comparison(s): Changes from prior study are noted. Concerns for asymmetric apical hypertrophy. FINDINGS  Left Ventricle: Concerns for asymmetric hypertrophy of the apical segments but images could be foreshoretened. Would recommend repeat limited study with contrast or cardiac MRI for clarification. Left ventricular ejection fraction, by estimation, is 65 to 70%. The left ventricle has normal function. The left ventricle has no regional wall motion abnormalities. The left ventricular internal cavity size was normal in  size. There is moderate concentric left ventricular hypertrophy. Left ventricular diastolic parameters are consistent with Grade I diastolic dysfunction (impaired relaxation). Right Ventricle: The right ventricular size is normal. No increase in right ventricular wall thickness. Right ventricular systolic function is normal. Tricuspid regurgitation signal is inadequate for assessing PA pressure. Left Atrium: Left atrial size was normal in size. Right Atrium: Right atrial size was normal in size. Pericardium: There is no evidence of pericardial effusion. Mitral Valve: The mitral valve is grossly normal. Trivial mitral valve regurgitation. No evidence of mitral valve stenosis. Tricuspid Valve: The tricuspid valve is grossly normal. Tricuspid valve regurgitation is trivial. No evidence of tricuspid stenosis. Aortic Valve: The aortic valve is tricuspid. Aortic valve regurgitation is trivial. No aortic stenosis is present. Aortic valve peak gradient measures 6.6 mmHg. Pulmonic Valve: The pulmonic valve was grossly normal. Pulmonic valve regurgitation is trivial. No evidence of pulmonic stenosis. Aorta: The aortic root and ascending aorta are structurally normal, with no evidence of dilitation. Venous: The inferior vena cava is normal in size with greater than 50% respiratory variability, suggesting right atrial pressure of 3 mmHg. IAS/Shunts: The atrial septum is grossly normal.  LEFT VENTRICLE PLAX 2D LVIDd:         4.10 cm   Diastology LVIDs:         2.40 cm   LV e' medial:    4.68 cm/s LV PW:         1.56 cm   LV E/e' medial:  17.2 LV IVS:        1.50 cm   LV e' lateral:   4.24 cm/s LVOT diam:     2.00 cm   LV E/e' lateral: 19.0 LV SV:         73 LV SV Index:   47 LVOT Area:     3.14 cm LV IVRT:       53 msec  RIGHT VENTRICLE             IVC RV S prime:     12.00 cm/s  IVC diam: 1.00 cm TAPSE (M-mode): 1.9 cm LEFT ATRIUM             Index        RIGHT ATRIUM           Index LA diam:        3.20 cm 2.06 cm/m   RA  Area:     13.50 cm LA Vol (A2C):  44.2 ml 28.40 ml/m  RA Volume:   31.40 ml  20.18 ml/m LA Vol (A4C):   41.7 ml 26.79 ml/m LA Biplane Vol: 43.7 ml 28.08 ml/m  AORTIC VALVE AV Area (Vmax): 2.38 cm AV Vmax:        128.00 cm/s AV Peak Grad:   6.6 mmHg LVOT Vmax:      96.90 cm/s LVOT Vmean:     60.800 cm/s LVOT VTI:       0.232 m  AORTA Ao Root diam: 3.00 cm Ao Asc diam:  3.40 cm MITRAL VALVE MV Area (PHT): 2.99 cm    SHUNTS MV Decel Time: 254 msec    Systemic VTI:  0.23 m MV E velocity: 80.70 cm/s  Systemic Diam: 2.00 cm MV A velocity: 89.60 cm/s MV E/A ratio:  0.90 Darryle Decent MD Electronically signed by Darryle Decent MD Signature Date/Time: 01/06/2024/12:27:31 PM    Final    MR BRAIN WO CONTRAST Result Date: 01/06/2024 EXAM: MRI BRAIN WITHOUT CONTRAST 01/06/2024 06:59:06 AM TECHNIQUE: Multiplanar multisequence MRI of the head/brain was performed without the administration of intravenous contrast. COMPARISON: CT head, CTA head and neck 01/05/2024. Brain MRI 01/15/2022. CLINICAL HISTORY: 83 year old female with history of hemorrhagic stroke and recent acute hemorrhage. FINDINGS: BRAIN AND VENTRICLES: Oval T1 isointense T2 heterogeneous intra-axial hemorrhage in the lateral right thalamus measures 8 x 8 x 11 mm, estimated volume 0 ml. Regional T2 and FLAIR heterogeneous edema (series 11 image 24 clear) but no significant regional mass effect. Numerous chronic microhemorrhages in the brain, although concentrated in the bilateral deep gray nuclei, brainstem, and bilateral cerebellum. However, there are areas of previous more macroscopic hemorrhage in both temporal lobes, and in the right cerebellar hemisphere which appears to correspond to the CT appearance yesterday. No convincing cerebellar edema. On DWI, there is a small focus of nonhemorrhagic restricted diffusion in the right superior cerebellum on series 7 image 47 compatible with small acute right SCA territory infarct. Little of the associated T2 and  FLAIR hyperintensity. No hemorrhagic transformation there. Chronic left PCA territory infarct and subchondralization. No other convincing acute infarct. No intraventricular blood. No extra-axial blood is identified. No mass. No midline shift. No hydrocephalus. Normal basilar cisterns. Normal flow voids. The sella is unremarkable. ORBITS: Postoperative changes to the left globe. SINUSES AND MASTOIDS: No acute abnormality. BONES AND SOFT TISSUES: Normal marrow signal. Normal for age visible cervical spine. No acute soft tissue abnormality. IMPRESSION: 1. Small acute hemorrhage in the right thalamus (1 mL) with regional edema but no significant mass effect. 2. Superimposed small acute Right SCA cerebellar infarct without hemorrhagic transformation or mass effect. 3. Extensive chronic hemorrhage in the brain, with pattern slightly favoring advanced small vessel disease rather than amyloid angiopathy. Electronically signed by: Helayne Hurst MD 01/06/2024 07:08 AM EDT RP Workstation: [id number]   CT ANGIO HEAD NECK W WO CM (CODE STROKE) Result Date: 01/05/2024 EXAM: CTA HEAD AND NECK WITH AND WITHOUT 01/05/2024 08:54:35 PM TECHNIQUE: CTA of the head and neck was performed with and without the administration of intravenous contrast. Multiplanar 2D and/or 3D reformatted images are provided for review. Automated exposure control, iterative reconstruction, and/or weight based adjustment of the mA/kV was utilized to reduce the radiation dose to as low as reasonably achievable. Stenosis of the internal carotid arteries measured using NASCET criteria. COMPARISON: None available CLINICAL HISTORY: Neuro deficit, acute, stroke suspected. Chief complaints; Weakness; CT ANGIO HEAD NECK W WO CM (CODE STROKE); Neuro deficit, acute, stroke suspected; 75ml [id number] FINDINGS:  AORTIC ARCH AND ARCH VESSELS: No dissection or arterial injury. No significant stenosis of the brachiocephalic or subclavian arteries. CERVICAL CAROTID ARTERIES: No  dissection, arterial injury, or hemodynamically significant stenosis by NASCET criteria. CERVICAL VERTEBRAL ARTERIES: No dissection, arterial injury, or significant stenosis. LUNGS AND MEDIASTINUM: Unremarkable. SOFT TISSUES: No acute abnormality. BONES: No acute abnormality. ANTERIOR CIRCULATION: No significant stenosis of the internal carotid arteries. No significant stenosis of the anterior cerebral arteries. No significant stenosis of the middle cerebral arteries. No aneurysm. POSTERIOR CIRCULATION: No significant stenosis of the posterior cerebral arteries. No significant stenosis of the basilar artery. No significant stenosis of the vertebral arteries. No aneurysm. OTHER: No dural venous sinus thrombosis on this non-dedicated study. IMPRESSION: 1. No large vessel occlusion, hemodynamically significant stenosis, or aneurysm in the head or neck. Electronically signed by: Gilmore Molt MD 01/05/2024 09:04 PM EDT RP Workstation: [id number]   CT HEAD CODE STROKE WO CONTRAST Result Date: 01/05/2024 EXAM: CT HEAD WITHOUT 01/05/2024 08:44:03 PM TECHNIQUE: CT of the head was performed without the administration of intravenous contrast. Automated exposure control, iterative reconstruction, and/or weight based adjustment of the mA/kV was utilized to reduce the radiation dose to as low as reasonably achievable. COMPARISON: CT head Jul 29, 2023 CLINICAL HISTORY: Neuro deficit, acute, stroke suspected. FINDINGS: BRAIN AND VENTRICLES: Acute 0.9 x 0.8 x 1.0 cm intraparenchymal hemorrhage in the right thalamus. Mild surrounding edema without substantial mass effect. New 1.2 cm hyperdensity in the inferior right cerebellum could represent additional hemorrhage versus mineralization. No mass effect or midline shift. No extra-axial fluid collection. No evidence of acute large vascular territory infarct. Remote left PCA territory infarct. Patchy white matter hypodensities, compatible with chronic microvascular ischemic  change. No hydrocephalus. ORBITS: No acute abnormality. SINUSES AND MASTOIDS: No acute abnormality. SOFT TISSUES AND SKULL: No acute skull fracture. Other: Dr. Michaela paged at the time of dictation for call of report. Awaiting call back. IMPRESSION: 1. Acute 1 cm hemorrhage in right thalamus.  No substantial mass effect. 2. New 1.2 cm hyperdensity in the inferior right cerebellum could represent additional hemorrhage versus mineralization. Recommend attention on follow up. 3. Remote left PCA territory infarct and chronic microvascular ischemic change. Electronically signed by: Gilmore Molt MD 01/05/2024 08:59 PM EDT RP Workstation: [id number]    Vitals:   01/06/24 1300 01/06/24 1315 01/06/24 1330 01/06/24 1345  BP: (!) 137/99 (!) 144/78 (!) 150/80 (!) 160/87  Pulse: (!) 58 60 (!) 56 (!) 57  Resp: (!) 25 (!) 24 (!) 23 18  Temp:      TempSrc:      SpO2: 100% 100% 100% 100%  Weight:      Height:         PHYSICAL EXAM General:  Alert, well-nourished, well-developed patient in no acute distress Psych:  Mood and affect appropriate for situation CV: Regular rate and rhythm on monitor Respiratory:  Regular, unlabored respirations on room air GI: Abdomen soft and nontender   NEURO:  Mental Status: AA&Ox3, patient is able to give clear and coherent history Speech/Language: speech is without dysarthria or aphasia.  Naming, repetition, fluency, and comprehension intact.  Cranial Nerves:  II: right hemianopia and right eye limited vision, identifies light. Can only see in left visual field in left eye.  III, IV, VI: EOMI. Eyelids elevate symmetrically.  V: Sensation is intact to light touch and symmetrical to face.  CPP:ozqu facial asymmetry VIII: hearing intact to voice. IX, X: Palate elevates symmetrically. Phonation is hypophonic KP:Dynloizm shrug 5/5. XII: tongue is  midline without fasciculations. Motor: Slight left upper extremity drift Tone: is normal and bulk is  normal Sensation- Diminished sensation on the left  Coordination: LUE and LLE ataxia out of proportion to weakness  Gait- deferred    ASSESSMENT/PLAN  Sara Snow is a 83 y.o. female with history of  previous stroke as well as atrial fibrillation on anticoagulation with Eliquis  who was in her normal state of health until she noticed that she was dropping things about 5:30 PM.  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 New 1.2 cm hyperdensity in the inferior right cerebellum could represent additional hemorrhage. Remote left PCA territory infarct. CTA head & neck No large vessel occlusion, hemodynamically significant stenosis, or aneurysm in the head or neck. MRI  Small acute hemorrhage in the right thalamus (1 mL) with regional edema but no significant mass effect. Superimposed small acute Right SCA cerebellar infarct without hemorrhagic transformation or mass effect. Extensive chronic hemorrhage in the brain, with pattern slightly favoring advanced small vessel disease rather than amyloid angiopathy. 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, modereate R M2 stenosis 10/2022 Diagnosed with afib and put on eliquis  2.5mg  BID  Atrial fibrillation Found in 10/2022, put on eliquis  Home Meds also include Amiodarone , metoprolol    Continue telemetry monitoring HR 55-65, rate controlled No AC at this time due to ICH  Hypertension Home meds:  Lasix , metoprolol  Stable Now on metoprolol  BP goal < 160 Cleviprex off, PRN hydralazine  Long term goal normotensive  Hyperlipidemia Home meds: Atorvastatin  40 mg, resumed in hospital LDL Pending, goal < 70 Continue statin at discharge  Other Active Problems Recent cataract surgery on left eye, Steroid and cosopt eye drops resumed R eye blind RA RLS Vascular dementia, followed with Dr. Sharri at [facility] day # 1   Patient seen and examined by NP/APP with MD. MD to update note as needed.   Jorene Last, DNP, FNP-BC [facility] Pager: [phone]  ATTENDING NOTE: I reviewed above note and agree with the assessment and plan. Pt was seen and examined.   Husband at bedside.  Patient lethargic, but awake, alert, eyes open, orientated to age, place, time. No aphasia but paucity of speech, following all simple commands, mild dysarthria. Able to name and repeat in dysarthric voice.  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  Left facial droop. Tongue midline.  Left arm drift with ataxia, left lower extremity 4/5 also with ataxia, left lower extremity and face decreased light touch sensation.  Right upper and lower extremity 4/5, no ataxia., gait not tested.   For detailed assessment and plan, please refer to above as I have made changes wherever appropriate.   Ary Cummins, MD PhD Stroke Neurology 01/06/2024 8:40 PM  This patient is critically ill due to ICH, stroke, AFib now  off AC, hypertensive emergency and at significant risk of neurological worsening, death form hematoma expansion, cerebral edema, stroke, heart failure, hypertensive encephalopathy. This patient's care requires constant monitoring of vital signs, hemodynamics, respiratory and cardiac monitoring, review of multiple databases, neurological assessment, discussion  with family, other specialists and medical decision making of high complexity. I spent 45 minutes of neurocritical care time in the care of this patient. I had long discussion with husband at bedside, updated pt current condition, treatment plan and potential prognosis, and answered all the questions. He expressed understanding and appreciation.      To contact Stroke Continuity provider, please refer to [email or web address]. After hours, contact General Neurology

## 2024-01-06 NOTE — Plan of Care (Signed)
  Problem: Education: Goal: Knowledge of disease or condition will improve Outcome: Progressing   Problem: Intracerebral Hemorrhage Tissue Perfusion: Goal: Complications of Intracerebral Hemorrhage will be minimized Outcome: Progressing   Problem: Coping: Goal: Will identify appropriate support needs Outcome: Progressing   Problem: Health Behavior/Discharge Planning: Goal: Ability to manage health-related needs will improve Outcome: Progressing Goal: Goals will be collaboratively established with patient/family Outcome: Progressing   Problem: Nutrition: Goal: Risk of aspiration will decrease Outcome: Progressing Goal: Dietary intake will improve Outcome: Progressing   Problem: Clinical Measurements: Goal: Ability to maintain clinical measurements within normal limits will improve Outcome: Progressing Goal: Diagnostic test results will improve Outcome: Progressing   Problem: Activity: Goal: Risk for activity intolerance will decrease Outcome: Progressing   Problem: Coping: Goal: Level of anxiety will decrease Outcome: Progressing   Problem: Elimination: Goal: Will not experience complications related to bowel motility Outcome: Progressing Goal: Will not experience complications related to urinary retention Outcome: Progressing   Problem: Pain Managment: Goal: General experience of comfort will improve and/or be controlled Outcome: Progressing   Problem: Safety: Goal: Ability to remain free from injury will improve Outcome: Progressing

## 2024-01-06 NOTE — Progress Notes (Signed)
 Patient given PRN hydralazine for SBP 160s around 1357. Around 1400, patient bladder scanned and showed 500+ urine. Patient stated she felt like she could not urinate in bed, so NP Remi was asked if bed rest orders could be lifted so patient could use bedside commode. ED RN at transfer noted that patient had used bedside commode successfully and patient and husband agreed. Bed rest orders were lifted and patient was given 2 nurse assist to bedside commode.   Patient tolerated transfer well. She sat at edge of bed before standing up. BP before getting up was SBP 150s. After sitting on bedside commode for about 3-79min, patient stated she felt dizzy. While waiting for second nurse to help move patient back to bed, patient slumped over on bedside commode. Left side was unable to cooperate in movements, right side weak. Patient stopped responding verbally, began snoring with held tilted back. Once back in bed, patient was put on nonrebreather, glucose checked, and MD Xu called. MD Jerri ordered 500mL NS bolus, lay flat, and stat head CT. BP back in bed was SBP 120s, then 110s. Before going to CT, patient began cooperating again, some increased slurred speech from previous NIH assessments, and overall weakness.

## 2024-01-07 LAB — CBC
HCT: 34.7 % — ABNORMAL LOW (ref 36.0–46.0)
Hemoglobin: 11.5 g/dL — ABNORMAL LOW (ref 12.0–15.0)
MCH: 30.6 pg (ref 26.0–34.0)
MCHC: 33.1 g/dL (ref 30.0–36.0)
MCV: 92.3 fL (ref 80.0–100.0)
Platelets: 176 K/uL (ref 150–400)
RBC: 3.76 MIL/uL — ABNORMAL LOW (ref 3.87–5.11)
RDW: 15.8 % — ABNORMAL HIGH (ref 11.5–15.5)
WBC: 8 K/uL (ref 4.0–10.5)
nRBC: 0 % (ref 0.0–0.2)

## 2024-01-07 LAB — BASIC METABOLIC PANEL WITH GFR
Anion gap: 12 (ref 5–15)
BUN: 22 mg/dL (ref 8–23)
CO2: 20 mmol/L — ABNORMAL LOW (ref 22–32)
Calcium: 8.7 mg/dL — ABNORMAL LOW (ref 8.9–10.3)
Chloride: 100 mmol/L (ref 98–111)
Creatinine, Ser: 1.11 mg/dL — ABNORMAL HIGH (ref 0.44–1.00)
GFR, Estimated: 50 mL/min — ABNORMAL LOW (ref >60)
Glucose, Bld: 103 mg/dL — ABNORMAL HIGH (ref 70–99)
Potassium: 3.7 mmol/L (ref 3.5–5.1)
Sodium: 132 mmol/L — ABNORMAL LOW (ref 135–145)

## 2024-01-07 LAB — LIPID PANEL
Cholesterol: 157 mg/dL (ref 0–200)
HDL: 82 mg/dL (ref >40)
LDL Cholesterol: 65 mg/dL (ref 0–99)
Total CHOL/HDL Ratio: 1.9 ratio
Triglycerides: 48 mg/dL (ref <150)
VLDL: 10 mg/dL (ref 0–40)

## 2024-01-07 LAB — HEMOGLOBIN A1C
Hgb A1c MFr Bld: 5.3 % (ref 4.8–5.6)
Mean Plasma Glucose: 105.41 mg/dL

## 2024-01-07 MED ORDER — SODIUM CHLORIDE 0.9 % IV BOLUS
500.0000 mL | Freq: Once | INTRAVENOUS | Status: AC
  Administered 2024-01-07: 500 mL via INTRAVENOUS

## 2024-01-07 MED ORDER — HYDRALAZINE HCL 20 MG/ML IJ SOLN
5.0000 mg | INTRAMUSCULAR | Status: DC | PRN
  Administered 2024-01-07: 5 mg via INTRAVENOUS
  Administered 2024-01-08: 10 mg via INTRAVENOUS
  Filled 2024-01-07 (×2): qty 1

## 2024-01-07 MED ORDER — SODIUM CHLORIDE 0.9 % IV SOLN: INTRAVENOUS | Status: DC

## 2024-01-07 MED ORDER — PANTOPRAZOLE SODIUM 40 MG PO TBEC
40.0000 mg | DELAYED_RELEASE_TABLET | Freq: Every day | ORAL | Status: DC
  Administered 2024-01-07 – 2024-01-09 (×3): 40 mg via ORAL
  Filled 2024-01-07 (×3): qty 1

## 2024-01-07 MED ORDER — HYDRALAZINE HCL 20 MG/ML IJ SOLN: 10.0000 mg | INTRAMUSCULAR | Status: DC | PRN

## 2024-01-07 MED ORDER — ENOXAPARIN SODIUM 30 MG/0.3ML IJ SOSY
30.0000 mg | PREFILLED_SYRINGE | Freq: Every day | INTRAMUSCULAR | Status: DC
  Administered 2024-01-07 – 2024-01-08 (×2): 30 mg via SUBCUTANEOUS
  Filled 2024-01-07 (×2): qty 0.3

## 2024-01-07 NOTE — Progress Notes (Addendum)
 STROKE TEAM PROGRESS NOTE   INTERIM HISTORY/SUBJECTIVE Husband, daughter are at bedside.  Patient lying bed, neuro stable, no specific complaints.  No headache.  However, patient had episode of syncope yesterday after BP meds and sitting up in bed.  CT repeat no acute change.  Patient received IV bolus and gradually back to baseline.  This morning patient also received IV hydralazine, BP down to 80s, received IV fluid and now BP 110s, neuro stable.  OBJECTIVE  CBC    Component Value Date/Time   WBC 8.0 01/07/2024 0342   RBC 3.76 (L) 01/07/2024 0342   HGB 11.5 (L) 01/07/2024 0342   HGB 12.3 06/05/2023 1502   HCT 34.7 (L) 01/07/2024 0342   HCT 38.2 06/05/2023 1502   PLT 176 01/07/2024 0342   PLT 196 06/05/2023 1502   MCV 92.3 01/07/2024 0342   MCV 96 06/05/2023 1502   MCH 30.6 01/07/2024 0342   MCHC 33.1 01/07/2024 0342   RDW 15.8 (H) 01/07/2024 0342   RDW 12.3 06/05/2023 1502   LYMPHSABS 1.4 01/05/2024 2038   LYMPHSABS 1.2 06/05/2023 1502   MONOABS 0.6 01/05/2024 2038   EOSABS 0.1 01/05/2024 2038   EOSABS 0.1 06/05/2023 1502   BASOSABS 0.1 01/05/2024 2038   BASOSABS 0.0 06/05/2023 1502    BMET    Component Value Date/Time   NA 132 (L) 01/07/2024 0342   NA 136 07/27/2023 1357   K 3.7 01/07/2024 0342   CL 100 01/07/2024 0342   CO2 20 (L) 01/07/2024 0342   GLUCOSE 103 (H) 01/07/2024 0342   BUN 22 01/07/2024 0342   BUN 23 07/27/2023 1357   CREATININE 1.11 (H) 01/07/2024 0342   CALCIUM  8.7 (L) 01/07/2024 0342   EGFR 52 (L) 07/27/2023 1357   GFRNONAA 50 (L) 01/07/2024 0342    IMAGING past 24 hours CT HEAD WO CONTRAST ( ) Result Date: 01/06/2024 EXAM: CT HEAD WITHOUT CONTRAST 01/06/2024 02:45:17 PM TECHNIQUE: CT of the head was performed without the administration of intravenous contrast. Automated exposure control, iterative reconstruction, and/or weight based adjustment of the mA/kV was utilized to reduce the radiation dose to as low as reasonably achievable.  COMPARISON: 01/05/2024 CLINICAL HISTORY: Stroke, follow up; neuro change. Chief complaints; Weakness; CT HEAD WO CONTRAST ( ); Stroke, follow up, neuro change. FINDINGS: BRAIN AND VENTRICLES: Unchanged right thalamic hemorrhage. Mild edema. Remote left PCA territory infarct. Confluent periventricular and subcortical white matter hypoattenuation, consistent with advanced chronic ischemic microvascular disease. Unchanged 3 cm calcification in the right cerebellar hemisphere. No hydrocephalus. No extra-axial collection. No mass effect or midline shift. ORBITS: Left lens replacement. SINUSES: No acute abnormality. SOFT TISSUES AND SKULL: No acute soft tissue abnormality. No skull fracture. IMPRESSION: 1. Unchanged right thalamic hemorrhage with mild edema. 2. Remote left PCA territory infarct. 3. Advanced chronic ischemic microvascular disease. 4. Unchanged 3 cm calcification in the right cerebellar hemisphere. Electronically signed by: Lonni Necessary MD 01/06/2024 02:57 PM EDT RP Workstation: [id number]    Vitals:   01/07/24 1030 01/07/24 1100 01/07/24 1130 01/07/24 1200  BP: 119/65 (!) 102/52 (!) 110/53 (!) 118/58  Pulse: (!) 57 (!) 53 (!) 55 (!) 54  Resp: 18 18 15 15   Temp:      TempSrc:      SpO2: 98% 99% 100% 99%  Weight:      Height:         PHYSICAL EXAM General:  Alert, well-nourished, well-developed patient in no acute distress CV: Regular rate and rhythm on monitor Respiratory:  Regular, unlabored  respirations on room air GI: Abdomen soft and nontender  NEURO:  Patient mildly lethargic, but awake, alert, eyes open, orientated to age, place, time. No aphasia but paucity of speech, following all simple commands, mild dysarthria. Able to name and repeat in dysarthric voice. 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  Mild left facial droop. Tongue midline.  Left arm slight drift with mild ataxia, left lower extremity 4/5 also with mild ataxia, left lower extremity and face decreased light touch sensation. Right upper and lower extremity 4/5, no ataxia., gait not tested.    ASSESSMENT/PLAN  [name] is a 83 y.o. female with history of  previous stroke as well as atrial fibrillation on anticoagulation with Eliquis  who was in her normal state of health until she noticed that she was dropping things about 5:30 PM.  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 New 1.2 cm hyperdensity in the inferior right cerebellum could represent additional hemorrhage. Remote left PCA territory infarct. CTA head & neck No large vessel occlusion, hemodynamically significant stenosis, or aneurysm in the head or neck. MRI  Small acute hemorrhage in the right thalamus (1 mL) with regional edema but no significant mass effect. Superimposed small acute Right SCA cerebellar infarct without hemorrhagic transformation or mass effect. Extensive chronic hemorrhage in the brain, with pattern slightly favoring advanced small vessel disease rather than amyloid angiopathy. 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, modereate R M2 stenosis 10/2022 Diagnosed with afib and put on eliquis  2.5mg  BID  Atrial fibrillation Found in 10/2022, put on eliquis  Home Meds also include Amiodarone , metoprolol    Continue telemetry monitoring HR 55-65,  rate controlled No AC at this time due to ICH  Hypertension Hypotension after IV hydralazine  Home meds: Lasix , metoprolol  Stable now  Now on metoprolol  BP goal < 160 Cleviprex off PRN low dose hydralazine  Long term goal normotensive  Hyperlipidemia Home meds: Atorvastatin  40 mg, resumed in hospital LDL 65, goal < 70 Continue statin at discharge  Other Active Problems Recent cataract surgery on left eye, Steroid and cosopt eye drops resumed R eye blind RA RLS Vascular dementia, followed with Dr. Sharri at [facility] day # 2    Ary Cummins, MD PhD Stroke Neurology 01/07/2024 12:03 PM  This patient is critically ill due to ICH, stroke, AFib now off [facility], hypertensive emergency and at significant risk of neurological worsening, death form hematoma expansion, cerebral edema, stroke, heart failure, hypertensive encephalopathy. This patient's care requires constant monitoring of vital signs, hemodynamics, respiratory and cardiac monitoring, review of multiple databases, neurological assessment, discussion with family, other specialists and medical decision making of high complexity. I spent 35 minutes of neurocritical care time in the care of this patient. I had long discussion with husband and daughter at bedside, updated pt current condition,  treatment plan and potential prognosis, and answered all the questions. They expressed understanding and appreciation.      To contact Stroke Continuity provider, please refer to [email or web address]. After hours, contact General Neurology

## 2024-01-07 NOTE — Evaluation (Signed)
 Physical Therapy Evaluation Patient Details Name: Sara Snow MRN: [id number] DOB: May 29, 1940 Today's Date: 01/07/2024  History of Present Illness  83 y.o. female presented 01/05/24 after noticing she was dropping things. CT head- two areas of hyperintensity concerning for hemorrhage (R thalamic, R cerebellar). NIHSS=7  PMH - htn, CVA, afib on anticoagulation, RA, blind R eye  Clinical Impression   Pt admitted secondary to problem above with deficits below. PTA patient lives with spouse in Independent Living apartment. She does not use a device for walking inside apartment and uses rollator when going out (including to dining room). Pt currently demonstrates mild LLE weakness and decr sensation, decr balance, orthostasis with dizziness, and unable to progress to ambulation. Patient will benefit from continued inpatient follow up therapy, <3 hours/day.  Patient hopeful to progress to HHPT in her apartment. Anticipate patient will benefit from PT to address problems listed below. Will continue to follow acutely to maximize functional mobility, independence, and safety.           If plan is discharge home, recommend the following: A little help with walking and/or transfers;Assistance with cooking/housework;Assistance with feeding;Direct supervision/assist for medications management;Direct supervision/assist for financial management;Assist for transportation;Supervision due to cognitive status   Can travel by private vehicle   Yes    Equipment Recommendations None recommended by PT  Recommendations for Other Services       Functional Status Assessment Patient has had a recent decline in their functional status and demonstrates the ability to make significant improvements in function in a reasonable and predictable amount of time.     Precautions / Restrictions Precautions Precautions: Fall;Other (comment) Recall of Precautions/Restrictions: Intact Precaution/Restrictions Comments:  R blindness; L with R hemianopsia      Mobility  Bed Mobility Overal bed mobility: Needs Assistance Bed Mobility: Supine to Sit     Supine to sit: HOB elevated, Min assist     General bed mobility comments: incr time to get legs off bed; assist to raise torso; incr time/effort to scoot out with feet to floor    Transfers Overall transfer level: Needs assistance Equipment used: Rolling walker (2 wheels) Transfers: Sit to/from Stand, Bed to chair/wheelchair/BSC Sit to Stand: Min assist   Step pivot transfers: Min assist       General transfer comment: vc for hand placement (one hand on RW, one pushing up); boosting assist; assist to maneuver RW as turning and cues to fully turn and back up to chair    Ambulation/Gait               General Gait Details: deferred due to dizziness with + orthostasis  Stairs            Wheelchair Mobility     Tilt Bed    Modified Rankin (Stroke Patients Only)       Balance Overall balance assessment: Needs assistance Sitting-balance support: No upper extremity supported, Feet supported Sitting balance-Leahy Scale: Fair     Standing balance support: Bilateral upper extremity supported Standing balance-Leahy Scale: Poor                               Pertinent Vitals/Pain Pain Assessment Pain Assessment: No/denies pain    Home Living Family/patient expects to be discharged to:: Private residence Living Arrangements: Spouse/significant other Available Help at Discharge: Family;Available 24 hours/day Type of Home: Independent living facility Home Access: Elevator       Home Layout: One level  Home Equipment: Rollator (4 wheels);Shower seat;Wheelchair - manual      Prior Function Prior Level of Function : Needs assist             Mobility Comments: uses rollator outside apartment; no device inside       Extremity/Trunk Assessment   Upper Extremity Assessment Upper Extremity Assessment:  Defer to OT evaluation    Lower Extremity Assessment Lower Extremity Assessment: Generalized weakness;LLE deficits/detail LLE Deficits / Details: 3+-4/5 throughout; LLE Sensation: decreased light touch    Cervical / Trunk Assessment Cervical / Trunk Assessment: Kyphotic  Communication   Communication Communication: Impaired Factors Affecting Communication: Difficulty expressing self (word finding issues)    Cognition Arousal: Alert Behavior During Therapy: WFL for tasks assessed/performed   PT - Cognitive impairments: Orientation, Difficult to assess Difficult to assess due to: Impaired communication Orientation impairments: Place, Time                   PT - Cognition Comments: [place] November Following commands: Impaired Following commands impaired: Only follows one step commands consistently     Cueing Cueing Techniques: Verbal cues, Tactile cues     General Comments General comments (skin integrity, edema, etc.): BP supine 135/74 (91); sitting 134/59 (81); sitting after transfer 111/63 (78)    Exercises     Assessment/Plan    PT Assessment Patient needs continued PT services  PT Problem List Decreased strength;Decreased activity tolerance;Decreased balance;Decreased mobility;Decreased cognition;Decreased knowledge of use of DME;Decreased safety awareness;Cardiopulmonary status limiting activity;Impaired sensation       PT Treatment Interventions DME instruction;Gait training;Functional mobility training;Therapeutic activities;Therapeutic exercise;Balance training;Neuromuscular re-education;Cognitive remediation;Patient/family education    PT Goals (Current goals can be found in the Care Plan section)  Acute Rehab PT Goals Patient Stated Goal: return home with [facility] PT Goal Formulation: With patient Time For Goal Achievement: 01/21/24 Potential to Achieve Goals: Good    Frequency Min 2X/week     Co-evaluation               AM-PAC PT 6  Clicks Mobility  Outcome Measure Help needed turning from your back to your side while in a flat bed without using bedrails?: A Little Help needed moving from lying on your back to sitting on the side of a flat bed without using bedrails?: A Little Help needed moving to and from a bed to a chair (including a wheelchair)?: A Little Help needed standing up from a chair using your arms (e.g., wheelchair or bedside chair)?: A Little Help needed to walk in hospital room?: Total (<20 ft) Help needed climbing 3-5 steps with a railing? : Total 6 Click Score: 14    End of Session Equipment Utilized During Treatment: Gait belt Activity Tolerance: Treatment limited secondary to medical complications (Comment) (orthostasis) Patient left: in chair;with call bell/phone within reach;with family/visitor present;with nursing/sitter in room (chair alarm under pt, no alarm box in room) Nurse Communication: Mobility status;Other (comment) (drop in BP with mild dizziness) PT Visit Diagnosis: Unsteadiness on feet (R26.81);Muscle weakness (generalized) (M62.81);Difficulty in walking, not elsewhere classified (R26.2);Other symptoms and signs involving the nervous system (R29.898);Dizziness and giddiness (R42)    Time: 8683-8652 PT Time Calculation (min) (ACUTE ONLY): 31 min   Charges:   PT Evaluation $PT Eval Low Complexity: 1 Low PT Treatments $Therapeutic Activity: 8-22 mins PT General Charges $$ ACUTE PT VISIT: 1 Visit          Macario RAMAN, PT Acute Rehabilitation Services  Office [phone]   Macario P  Aadin Gaut 01/07/2024, 2:03 PM

## 2024-01-07 NOTE — Progress Notes (Signed)
 BP's soft following PRN hydralazine given at [facility] for hypertension. Patient drowsy, but awakens easily and answers all orientation questions appropriately. No changes noted in neuro exam. Stroke NP aware, medications adjusted, bolus ordered.

## 2024-01-08 LAB — CBC
HCT: 34.9 % — ABNORMAL LOW (ref 36.0–46.0)
Hemoglobin: 11.4 g/dL — ABNORMAL LOW (ref 12.0–15.0)
MCH: 30.8 pg (ref 26.0–34.0)
MCHC: 32.7 g/dL (ref 30.0–36.0)
MCV: 94.3 fL (ref 80.0–100.0)
Platelets: 146 K/uL — ABNORMAL LOW (ref 150–400)
RBC: 3.7 MIL/uL — ABNORMAL LOW (ref 3.87–5.11)
RDW: 16 % — ABNORMAL HIGH (ref 11.5–15.5)
WBC: 5.8 K/uL (ref 4.0–10.5)
nRBC: 0 % (ref 0.0–0.2)

## 2024-01-08 LAB — BASIC METABOLIC PANEL WITH GFR
Anion gap: 9 (ref 5–15)
BUN: 19 mg/dL (ref 8–23)
CO2: 22 mmol/L (ref 22–32)
Calcium: 8.3 mg/dL — ABNORMAL LOW (ref 8.9–10.3)
Chloride: 102 mmol/L (ref 98–111)
Creatinine, Ser: 0.96 mg/dL (ref 0.44–1.00)
GFR, Estimated: 59 mL/min — ABNORMAL LOW (ref >60)
Glucose, Bld: 87 mg/dL (ref 70–99)
Potassium: 3.7 mmol/L (ref 3.5–5.1)
Sodium: 133 mmol/L — ABNORMAL LOW (ref 135–145)

## 2024-01-08 MED ORDER — HYDRALAZINE HCL 20 MG/ML IJ SOLN
5.0000 mg | INTRAMUSCULAR | Status: DC | PRN
  Administered 2024-01-08: 10 mg via INTRAVENOUS
  Filled 2024-01-08: qty 1

## 2024-01-08 MED ORDER — POTASSIUM CHLORIDE 20 MEQ PO PACK
20.0000 meq | PACK | Freq: Once | ORAL | Status: AC
  Administered 2024-01-08: 20 meq via ORAL
  Filled 2024-01-08: qty 1

## 2024-01-08 MED ORDER — ENOXAPARIN SODIUM 40 MG/0.4ML IJ SOSY
40.0000 mg | PREFILLED_SYRINGE | Freq: Every day | INTRAMUSCULAR | Status: DC
Start: 2024-01-09 — End: 2024-01-10
  Administered 2024-01-09 – 2024-01-10 (×2): 40 mg via SUBCUTANEOUS
  Filled 2024-01-08 (×2): qty 0.4

## 2024-01-08 NOTE — TOC CAGE-AID Note (Signed)
 Transition of Care [facility]) - CAGE-AID Screening   Patient Details  Name: Sara Snow MRN: [id number] Date of Birth: Aug 30, 1940  [facility]) CM/SW Contact:    Saloni Lablanc E Nikiya Starn, LCSW Phone Number: 01/08/2024, 9:45 AM   Clinical Narrative:    CAGE-AID Screening:    Have You Ever Felt You Ought to Cut Down on Your Drinking or Drug Use?: No Have People Annoyed You By [employer] Your Drinking Or Drug Use?: No Have You Felt Bad Or Guilty About Your Drinking Or Drug Use?: No Have You Ever Had a Drink or Used Drugs First Thing In The Morning to Steady Your Nerves or to Get Rid of a Hangover?: No CAGE-AID Score: 0  Substance Abuse Education Offered: No

## 2024-01-08 NOTE — Evaluation (Signed)
 Speech Language Pathology Evaluation Patient Details Name: Sara Snow MRN: [id number] DOB: 1940-12-17 Today's Date: 01/08/2024 Time: 9052-8993 SLP Time Calculation (min) (ACUTE ONLY): 19 min  Problem List:  Patient Active Problem List   Diagnosis Date Noted   ICH (intracerebral hemorrhage) (HCC) 01/05/2024   Encounter for monitoring amiodarone  therapy 01/03/2023   Persistent atrial fibrillation (HCC) 12/02/2022   Hypercoagulable state due to persistent atrial fibrillation (HCC) 12/02/2022   Melena 09/21/2022   Hypothyroidism 09/21/2022   Hypokalemia 09/21/2022   Acute diastolic heart failure (HCC) 09/20/2022   Atrial fibrillation with RVR (HCC) 09/19/2022   Acute congestive heart failure (HCC) 09/19/2022   Uncontrolled hypertension 09/19/2022   Anemia 09/19/2022   Past Medical History:  Past Medical History:  Diagnosis Date   GERD (gastroesophageal reflux disease)    Hypertension    RA (rheumatoid arthritis) (HCC)    Restless leg syndrome    Stroke [facility])    SVT (supraventricular tachycardia)    Thyroid disease    Past Surgical History:  Past Surgical History:  Procedure Laterality Date   BIOPSY  09/21/2022   Procedure: BIOPSY;  Surgeon: Stacia Glendia BRAVO, MD;  Location: [facility] ENDOSCOPY;  Service: Gastroenterology;;   CARDIOVERSION N/A 10/26/2022   Procedure: CARDIOVERSION;  Surgeon: Sheena Pugh, DO;  Location: [facility] INVASIVE CV LAB;  Service: Cardiovascular;  Laterality: N/A;   CARDIOVERSION N/A 01/19/2023   Procedure: CARDIOVERSION;  Surgeon: Michele Richardson, DO;  Location: [facility] INVASIVE CV LAB;  Service: Cardiovascular;  Laterality: N/A;   ESOPHAGOGASTRODUODENOSCOPY (EGD) WITH PROPOFOL  N/A 09/21/2022   Procedure: ESOPHAGOGASTRODUODENOSCOPY (EGD) WITH PROPOFOL ;  Surgeon: Stacia Glendia BRAVO, MD;  Location: [facility] ENDOSCOPY;  Service: Gastroenterology;  Laterality: N/A;   HPI:  83 y.o. female presented 01/05/24 after noticing she was dropping things. CT head- two areas of  hyperintensity concerning for hemorrhage (R thalamic, R cerebellar). NIHSS=7  PMH - htn, CVA, afib on anticoagulation, RA, blind R eye   Assessment / Plan / Recommendation Clinical Impression   Pt presents with nonfluent expressive aphasia per informal speech/language assessment completed today. Further higher level language and cognitive assessment may be beneficial. Some language tasks were modified due to pt's visual deficits.   Pt lives in an independent living facility with her husband and they are very active in multiple exercises classes and social events. She reported some word-finding difficulty at baseline from prior stroke, though this has significantly worsened since acute CVA. She reported she is very soft spoken at baseline.  Expressive language deficits characterized by word-finding difficulty in conversation, and semantic and phonemic paraphasias throughout structured tasks and conversation. Pt able to name simple objects (handed to her), answer Y/N questions, and follow simple directions accurately. She was challenged with repetition tasks at the sentence level and with generative naming. Speech observed to be intelligible with no concerns for dysarthria or apraxia observed at this time.   Pt is motivated to improve word-finding and would benefit from ongoing SLP services during hospitalization and at next level of care post discharge to address language goals.     SLP Assessment  SLP Recommendation/Assessment: Patient needs continued Speech Language Pathology Services SLP Visit Diagnosis: Aphasia (R47.01)     Assistance Recommended at Discharge   (Defer to PT/OT recommendations)  Functional Status Assessment Patient has had a recent decline in their functional status and demonstrates the ability to make significant improvements in function in a reasonable and predictable amount of time.  Frequency and Duration min 1 x/week  4 weeks  SLP Evaluation Cognition   Orientation Level: Oriented to person;Oriented to place;Oriented to situation;Disoriented to time Year: 2020 (easily redirected) Month: October Day of Week: Incorrect Attention: Focused Focused Attention: Appears intact Awareness: Appears intact       Comprehension  Auditory Comprehension Overall Auditory Comprehension: Appears within functional limits for tasks assessed (further higher level assessment may be beneficial) Yes/No Questions: Within Functional Limits Commands: Within Functional Limits Conversation: Complex Visual Recognition/Discrimination Discrimination: Not tested (visual deficits) Reading Comprehension Reading Status: Unable to assess (comment) (visual deficits)    Expression Expression Primary Mode of Expression: Verbal Verbal Expression Overall Verbal Expression: Impaired Initiation: No impairment Automatic Speech: Name;Social Response Level of Generative/Spontaneous Verbalization: Conversation Repetition: Impaired Level of Impairment: Sentence level Naming: No impairment Pragmatics: No impairment Effective Techniques: Open ended questions Non-Verbal Means of Communication: Not applicable   Oral / Motor  Oral Motor/Sensory Function Overall Oral Motor/Sensory Function: Within functional limits Motor Speech Overall Motor Speech: Appears within functional limits for tasks assessed Respiration: Within functional limits Phonation: Normal Resonance: Within functional limits Articulation: Within functional limitis Intelligibility: Intelligible Motor Planning: Within functional limits Motor Speech Errors: Not applicable            Peyton JINNY Rummer 01/08/2024, 10:23 AM

## 2024-01-08 NOTE — Evaluation (Signed)
 Occupational Therapy Evaluation Patient Details Name: Sara Snow MRN: [id number] DOB: [birth year] Today's Date: 01/08/2024   History of Present Illness   83 y.o. female presented 01/05/24 after noticing she was dropping things. CT head- two areas of hyperintensity concerning for hemorrhage (R thalamic, R cerebellar). NIHSS=7  PMH - htn, CVA, afib on anticoagulation, RA, blind R eye     Clinical Impressions PTA, pt lived with spouse at [facility]. At baseline, pt able to perform BADL with mod I and spouse assists with IADL, predominantly due to pt with low vision (R eye blind, L eye cataract s/p recent surgery per pt report). On eval, pt unclear whether vision has had any changes, decreased coordination, balance, and dizziness with 3+ mins OOB mobility with RW progressing from needing CGA to needing min A with onset dizziness. Attempted BP in standing while mobilizing back to bed 2x, but did not get reading. On sitting, at starting baseline, so likely did experience drop. Pt to continue to benefit from skilled OT services and recommending inpatient rehab <3 hours to facilitate safe transition home and to PLOF.      If plan is discharge home, recommend the following:   A little help with walking and/or transfers;A little help with bathing/dressing/bathroom;Assistance with cooking/housework;Help with stairs or ramp for entrance;Assist for transportation;Direct supervision/assist for financial management;Direct supervision/assist for medications management     Functional Status Assessment   Patient has had a recent decline in their functional status and demonstrates the ability to make significant improvements in function in a reasonable and predictable amount of time.     Equipment Recommendations   Other (comment) (defer)     Recommendations for Other Services         Precautions/Restrictions   Precautions Precautions: Fall;Other (comment) Recall of  Precautions/Restrictions: Intact Precaution/Restrictions Comments: R blindness; L with R hemianopsia Restrictions Weight Bearing Restrictions Per Provider Order: No     Mobility Bed Mobility Overal bed mobility: Needs Assistance Bed Mobility: Sit to Supine       Sit to supine: Min assist   General bed mobility comments: light assist for BLE into bed. directional cues for correct orientation within the bed    Transfers Overall transfer level: Needs assistance Equipment used: Rolling walker (2 wheels) Transfers: Sit to/from Stand Sit to Stand: Min assist           General transfer comment: vc for hand placement (one hand on RW, one pushing up); assist to stabilize RW      Balance Overall balance assessment: Needs assistance Sitting-balance support: No upper extremity supported, Feet supported Sitting balance-Leahy Scale: Fair     Standing balance support: Bilateral upper extremity supported Standing balance-Leahy Scale: Poor                             ADL either performed or assessed with clinical judgement   ADL Overall ADL's : Needs assistance/impaired Eating/Feeding: Minimal assistance;Sitting       Upper Body Bathing: Minimal assistance;Sitting   Lower Body Bathing: Moderate assistance;Sit to/from stand   Upper Body Dressing : Minimal assistance;Sitting   Lower Body Dressing: Moderate assistance;Sit to/from stand   Toilet Transfer: Minimal assistance;Contact guard assist;Ambulation;Rolling walker (2 wheels)           Functional mobility during ADLs: Contact guard assist;Minimal assistance;Rolling walker (2 wheels)       Vision Patient Visual Report: Blurring of vision (R eye blind baseline, recent cataract surgery) Vision  Assessment?: Vision impaired- to be further tested in functional context;Yes Tracking/Visual Pursuits: Impaired - to be further tested in functional context (tracks with significantly increased time a large  stimulus.) Additional Comments: denies diplopia. To be further assessed. Can see outlines, but more difficulty reading or making out facial features on people. pt unclear whether vision has progressed or regressed since reported recent cataract surgery to L eye     Perception         Praxis         Pertinent Vitals/Pain Pain Assessment Pain Assessment: No/denies pain     Extremity/Trunk Assessment Upper Extremity Assessment Upper Extremity Assessment: Generalized weakness;RUE deficits/detail;LUE deficits/detail RUE Deficits / Details: 4-/5 LUE Deficits / Details: 4-/5, decr coordination as compared to R on evak   Lower Extremity Assessment Lower Extremity Assessment: Defer to PT evaluation   Cervical / Trunk Assessment Cervical / Trunk Assessment: Kyphotic   Communication Communication Communication: Impaired Factors Affecting Communication: Difficulty expressing self (word finding issues)   Cognition Arousal: Alert Behavior During Therapy: WFL for tasks assessed/performed Cognition: Cognition impaired     Awareness: Online awareness impaired Memory impairment (select all impairments): Short-term memory Attention impairment (select first level of impairment): Selective attention, Alternating attention Executive functioning impairment (select all impairments): Organization, Problem solving                   Following commands: Impaired Following commands impaired: Only follows one step commands consistently     Cueing  General Comments   Cueing Techniques: Verbal cues;Tactile cues  BPs soft but stable ~116/55-124/55; however during mobility as pt became light headed, attempted a BP and did not get reading   Exercises     Shoulder Instructions      Home Living Family/patient expects to be discharged to:: Private residence Living Arrangements: Spouse/significant other Available Help at Discharge: Family;Available 24 hours/day Type of Home: Independent  living facility [facility]) Home Access: Elevator     Home Layout: One level     Bathroom Shower/Tub: Estate Manager/land Agent Accessibility: Yes   Home Equipment: Rollator (4 wheels);Shower seat;Wheelchair - manual      Lives With: Spouse    Prior Functioning/Environment Prior Level of Function : Needs assist             Mobility Comments: uses rollator outside apartment; no device inside ADLs Comments: spouse assists with IADLs of laundry (although pt sorts and folds), pt does not cook and needs A with reading due to low vision    OT Problem List: Decreased strength;Decreased activity tolerance;Impaired balance (sitting and/or standing);Decreased cognition;Decreased coordination;Decreased safety awareness;Decreased knowledge of use of DME or AE;Impaired UE functional use   OT Treatment/Interventions: Self-care/ADL training;Therapeutic exercise;DME and/or AE instruction;Therapeutic activities;Patient/family education;Balance training      OT Goals(Current goals can be found in the care plan section)   Acute Rehab OT Goals Patient Stated Goal: get better OT Goal Formulation: With patient Time For Goal Achievement: 01/22/24 Potential to Achieve Goals: Good   OT Frequency:  Min 2X/week    Co-evaluation              AM-PAC OT 6 Clicks Daily Activity     Outcome Measure Help from another person eating meals?: A Little Help from another person taking care of personal grooming?: A Little Help from another person toileting, which includes using toliet, bedpan, or urinal?: A Lot Help from another person bathing (including washing, rinsing, drying)?: A Lot Help from another  person to put on and taking off regular upper body clothing?: A Little Help from another person to put on and taking off regular lower body clothing?: A Lot 6 Click Score: 15   End of Session Equipment Utilized During Treatment: Gait belt;Rolling walker (2 wheels) Nurse  Communication: Mobility status  Activity Tolerance: Patient tolerated treatment well Patient left: in bed;with call bell/phone within reach;with bed alarm set;with family/visitor present;with nursing/sitter in room  OT Visit Diagnosis: Unsteadiness on feet (R26.81);Muscle weakness (generalized) (M62.81);Other symptoms and signs involving cognitive function;Low vision, both eyes (H54.2)                Time: 1545-1620 OT Time Calculation (min): 35 min Charges:  OT General Charges $OT Visit: 1 Visit OT Evaluation $OT Eval Moderate Complexity: 1 Mod OT Treatments $Self Care/Home Management : 8-22 mins  Elma JONETTA Lebron FREDERICK, OTR/L [facility] Acute Rehabilitation Office: [phone]   Elma JONETTA Lebron 01/08/2024, 5:31 PM

## 2024-01-08 NOTE — Progress Notes (Signed)
 Physical Therapy Treatment Patient Details Name: Sara Snow MRN: [id number] DOB: 1940-07-10 Today's Date: 01/08/2024   History of Present Illness 83 y.o. female presented 01/05/24 after noticing she was dropping things. CT head- two areas of hyperintensity concerning for hemorrhage (R thalamic, R cerebellar). NIHSS=7  PMH - htn, CVA, afib on anticoagulation, RA, blind R eye    PT Comments  Patient still with some drop in her BP with changes in position, however not technically orthostatic. Able to progress to ambulation x 30 ft with RW and min assist. Pt required assist to steer/maneuver RW as she is accustomed to rollator. If continues to progress, may be able to switch to return to her apartment with HHPT.     If plan is discharge home, recommend the following: A little help with walking and/or transfers;Assistance with cooking/housework;Assistance with feeding;Direct supervision/assist for medications management;Direct supervision/assist for financial management;Assist for transportation;Supervision due to cognitive status   Can travel by private vehicle     Yes  Equipment Recommendations  None recommended by PT    Recommendations for Other Services       Precautions / Restrictions Precautions Precautions: Fall;Other (comment) Recall of Precautions/Restrictions: Intact Precaution/Restrictions Comments: R blindness; L with R hemianopsia Restrictions Weight Bearing Restrictions Per Provider Order: No     Mobility  Bed Mobility Overal bed mobility: Needs Assistance Bed Mobility: Supine to Sit     Supine to sit: HOB elevated, Min assist     General bed mobility comments: incr time to get legs off bed; assist to raise torso; incr time/effort to scoot out with feet to floor    Transfers Overall transfer level: Needs assistance Equipment used: Rolling walker (2 wheels) Transfers: Sit to/from Stand Sit to Stand: Min assist           General transfer comment: vc  for hand placement (one hand on RW, one pushing up); assist to stabilize RW    Ambulation/Gait Ambulation/Gait assistance: Min assist Gait Distance (Feet): 30 Feet Assistive device: Rolling walker (2 wheels) Gait Pattern/deviations: Step-through pattern, Decreased stride length   Gait velocity interpretation: <1.8 ft/sec, indicate of risk for recurrent falls   General Gait Details: able to see furniture and did not need cues for avoiding objects; assist to turn RW as she is accustomed to rollator   Stairs             Wheelchair Mobility     Tilt Bed    Modified Rankin (Stroke Patients Only) Modified Rankin (Stroke Patients Only) Pre-Morbid Rankin Score: Moderately severe disability Modified Rankin: Moderately severe disability     Balance Overall balance assessment: Needs assistance Sitting-balance support: No upper extremity supported, Feet supported Sitting balance-Leahy Scale: Fair     Standing balance support: Bilateral upper extremity supported Standing balance-Leahy Scale: Poor                              Communication Communication Communication: Impaired Factors Affecting Communication: Difficulty expressing self (word finding issues)  Cognition Arousal: Alert Behavior During Therapy: WFL for tasks assessed/performed   PT - Cognitive impairments: Orientation, Difficult to assess Difficult to assess due to: Impaired communication                       Following commands: Impaired Following commands impaired: Only follows one step commands consistently    Cueing Cueing Techniques: Verbal cues, Tactile cues  Exercises      General  Comments General comments (skin integrity, edema, etc.): BPs 124/78 supine; sit 116/65; stand 114/76; after walking 118/76      Pertinent Vitals/Pain Pain Assessment Pain Assessment: No/denies pain    Home Living     Available Help at Discharge: Family;Available 24 hours/day Type of Home:  Independent living facility                  Prior Function            PT Goals (current goals can now be found in the care plan section) Acute Rehab PT Goals Patient Stated Goal: return home with [facility] Time For Goal Achievement: 01/21/24 Potential to Achieve Goals: Good Progress towards PT goals: Progressing toward goals    Frequency    Min 2X/week      PT Plan      Co-evaluation              AM-PAC PT 6 Clicks Mobility   Outcome Measure  Help needed turning from your back to your side while in a flat bed without using bedrails?: A Little Help needed moving from lying on your back to sitting on the side of a flat bed without using bedrails?: A Little Help needed moving to and from a bed to a chair (including a wheelchair)?: A Little Help needed standing up from a chair using your arms (e.g., wheelchair or bedside chair)?: A Little Help needed to walk in hospital room?: A Little Help needed climbing 3-5 steps with a railing? : Total 6 Click Score: 16    End of Session Equipment Utilized During Treatment: Gait belt Activity Tolerance: Patient tolerated treatment well Patient left: in chair;with call bell/phone within reach;with family/visitor present;with nursing/sitter in room (chair alarm under pt, no alarm box in room) Nurse Communication: Mobility status PT Visit Diagnosis: Unsteadiness on feet (R26.81);Muscle weakness (generalized) (M62.81);Difficulty in walking, not elsewhere classified (R26.2);Other symptoms and signs involving the nervous system (R29.898);Dizziness and giddiness (R42)     Time: 8763-8741 PT Time Calculation (min) (ACUTE ONLY): 22 min  Charges:    $Gait Training: 8-22 mins PT General Charges $$ ACUTE PT VISIT: 1 Visit                      Macario RAMAN, PT Acute Rehabilitation Services  Office [phone]    Macario SHAUNNA Soja 01/08/2024, 1:05 PM

## 2024-01-08 NOTE — TOC Initial Note (Signed)
 Transition of Care [facility]) - Initial/Assessment Note    Patient Details  Name: Sara Snow MRN: [id number] Date of Birth: 03-02-1941  Transition of Care [facility]) CM/SW Contact:    Inocente GORMAN Kindle, LCSW Phone Number: 01/08/2024, 10:18 AM  Clinical Narrative:                 Patient admitted from [facility]. Per therapy, she will likely need to discharge to the SNF rehab side. CSW confirmed with [facility] that they do have a bed available for her once ready.   Expected Discharge Plan: Skilled Nursing Facility Barriers to Discharge: Continued Medical Work up   Patient Goals and CMS Choice            Expected Discharge Plan and Services In-house Referral: Clinical Social Work     Living arrangements for the past 2 months: Independent Living Facility                                      Prior Living Arrangements/Services Living arrangements for the past 2 months: Independent Living Facility Lives with:: Self Patient language and need for interpreter reviewed:: Yes Do you feel safe going back to the place where you live?: Yes      Need for Family Participation in Patient Care: Yes (Comment) Care giver support system in place?: Yes (comment)   Criminal Activity/Legal Involvement Pertinent to Current Situation/Hospitalization: No - Comment as needed  Activities of Daily Living   ADL Screening (condition at time of admission) Independently performs ADLs?: Yes (appropriate for developmental age) Is the patient deaf or have difficulty hearing?: No Does the patient have difficulty seeing, even when wearing glasses/contacts?: Yes (patient states recent cataract surgery and has not been able to get new glasses prescription) Does the patient have difficulty concentrating, remembering, or making decisions?: No  Permission Sought/Granted Permission sought to share information with : Facility Industrial/product Designer granted to share information  with : Yes, Verbal Permission Granted     Permission granted to share info w AGENCY: [facility]        Emotional Assessment Appearance:: Appears stated age     Orientation: : Oriented to Self, Oriented to Place, Oriented to  Time, Oriented to Situation Alcohol  / Substance Use: Not Applicable Psych Involvement: No (comment)  Admission diagnosis:  ICH (intracerebral hemorrhage) (HCC) [I61.9] Right-sided nontraumatic intracerebral hemorrhage, unspecified cerebral location [facility]) [I61.9] Patient Active Problem List   Diagnosis Date Noted   ICH (intracerebral hemorrhage) (HCC) 01/05/2024   Encounter for monitoring amiodarone  therapy 01/03/2023   Persistent atrial fibrillation (HCC) 12/02/2022   Hypercoagulable state due to persistent atrial fibrillation (HCC) 12/02/2022   Melena 09/21/2022   Hypothyroidism 09/21/2022   Hypokalemia 09/21/2022   Acute diastolic heart failure (HCC) 09/20/2022   Atrial fibrillation with RVR (HCC) 09/19/2022   Acute congestive heart failure (HCC) 09/19/2022   Uncontrolled hypertension 09/19/2022   Anemia 09/19/2022   PCP:  Feliciano Devoria LABOR, MD Pharmacy:   [facility] - [place], [place] - [address] [place] [address] Phone: [phone] Fax: [phone]  [id number] - [place], [place] - [address] AT [facility] [address] [place] [address] Phone: [phone] Fax: [phone]     Social Drivers of Health (SDOH) Social History: SDOH Screenings   Food Insecurity: No Food Insecurity (01/06/2024)  Housing: Low Risk  (  01/06/2024)  Transportation Needs: No Transportation Needs (01/06/2024)  Utilities: Not At Risk (01/06/2024)  Social Connections: Moderately Integrated (01/06/2024)  Tobacco Use: Low Risk  (01/06/2024)   SDOH Interventions:     Readmission Risk Interventions     No data to display

## 2024-01-08 NOTE — Progress Notes (Signed)
 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 Jerri.   Sergio Batch, PharmD, BCIDP, AAHIVP, CPP Infectious Disease Pharmacist 01/08/2024 2:11 PM

## 2024-01-08 NOTE — Plan of Care (Signed)
  Problem: Education: Goal: Knowledge of disease or condition will improve Outcome: Progressing   Problem: Intracerebral Hemorrhage Tissue Perfusion: Goal: Complications of Intracerebral Hemorrhage will be minimized Outcome: Progressing   Problem: Self-Care: Goal: Ability to participate in self-care as condition permits will improve Outcome: Progressing Goal: Ability to communicate needs accurately will improve Outcome: Progressing   Problem: Nutrition: Goal: Dietary intake will improve Outcome: Progressing   Problem: Clinical Measurements: Goal: Ability to maintain clinical measurements within normal limits will improve Outcome: Progressing Goal: Respiratory complications will improve Outcome: Progressing   Problem: Nutrition: Goal: Adequate nutrition will be maintained Outcome: Progressing   Problem: Pain Managment: Goal: General experience of comfort will improve and/or be controlled Outcome: Progressing

## 2024-01-08 NOTE — Plan of Care (Signed)
  Problem: SLP Language Goals Goal: Misc Speech Goal #1 Flowsheets (Taken 01/08/2024 1015) Misc Speech goal #1: Pt will utilize compensatory word-finding strategies in conversation to reduce communication breakdowns in >80% of opportunities.

## 2024-01-08 NOTE — Progress Notes (Signed)
 STROKE TEAM PROGRESS NOTE   INTERIM HISTORY/SUBJECTIVE Husband and speech therapist are at bedside.  Patient sitting in bed working with speech therapist, BP 110s, no acute event overnight. Has worked with PT and OT yesterday without problem.   OBJECTIVE  CBC    Component Value Date/Time   WBC 5.8 01/08/2024 0531   RBC 3.70 (L) 01/08/2024 0531   HGB 11.4 (L) 01/08/2024 0531   HGB 12.3 06/05/2023 1502   HCT 34.9 (L) 01/08/2024 0531   HCT 38.2 06/05/2023 1502   PLT 146 (L) 01/08/2024 0531   PLT 196 06/05/2023 1502   MCV 94.3 01/08/2024 0531   MCV 96 06/05/2023 1502   MCH 30.8 01/08/2024 0531   MCHC 32.7 01/08/2024 0531   RDW 16.0 (H) 01/08/2024 0531   RDW 12.3 06/05/2023 1502   LYMPHSABS 1.4 01/05/2024 2038   LYMPHSABS 1.2 06/05/2023 1502   MONOABS 0.6 01/05/2024 2038   EOSABS 0.1 01/05/2024 2038   EOSABS 0.1 06/05/2023 1502   BASOSABS 0.1 01/05/2024 2038   BASOSABS 0.0 06/05/2023 1502    BMET    Component Value Date/Time   NA 133 (L) 01/08/2024 0531   NA 136 07/27/2023 1357   K 3.7 01/08/2024 0531   CL 102 01/08/2024 0531   CO2 22 01/08/2024 0531   GLUCOSE 87 01/08/2024 0531   BUN 19 01/08/2024 0531   BUN 23 07/27/2023 1357   CREATININE 0.96 01/08/2024 0531   CALCIUM  8.3 (L) 01/08/2024 0531   EGFR 52 (L) 07/27/2023 1357   GFRNONAA 59 (L) 01/08/2024 0531    IMAGING past 24 hours No results found.   Vitals:   01/08/24 0900 01/08/24 0930 01/08/24 1000 01/08/24 1030  BP: 137/65 110/60 (!) 102/54 (!) 106/52  Pulse: (!) 55 65 64 (!) 57  Resp: 19 (!) 22 18 16   Temp:      TempSrc:      SpO2: 95% 91% 91% 94%  Weight:      Height:         PHYSICAL EXAM General:  Alert, well-nourished, well-developed patient in no acute distress CV: Regular rate and rhythm on monitor Respiratory:  Regular, unlabored respirations on room air GI: Abdomen soft and nontender  NEURO:  Patient awake, alert, eyes open, orientated to age, place, time. No aphasia but paucity of  speech, following all simple commands, mild dysarthria. Able to name and repeat in dysarthric voice. 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  Mild left facial droop. Tongue midline. Left arm slight drift with mild ataxia, left lower extremity 4/5 also with mild ataxia, left lower extremity and face decreased light touch sensation. Right upper and lower extremity 4/5, no ataxia., gait not tested.    ASSESSMENT/PLAN  [name] is a 83 y.o. female with history of  previous stroke as well as atrial fibrillation on anticoagulation with Eliquis  who was in her normal state of health until she noticed that she was dropping things about 5:30 PM.  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 New 1.2 cm hyperdensity in the inferior right cerebellum could represent  additional hemorrhage. Remote left PCA territory infarct. CTA head & neck No large vessel occlusion, hemodynamically significant stenosis, or aneurysm in the head or neck. MRI  Small acute hemorrhage in the right thalamus (1 mL) with regional edema but no significant mass effect. Superimposed small acute Right SCA cerebellar infarct without hemorrhagic transformation or mass effect. Extensive chronic hemorrhage in the brain, with pattern slightly favoring advanced small vessel disease rather than amyloid angiopathy. 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, modereate R M2 stenosis 10/2022 Diagnosed with afib and put on eliquis  2.5mg  BID  Atrial fibrillation Found in 10/2022, put on eliquis  Home Meds also include Amiodarone , metoprolol    Continue telemetry monitoring HR 55-65, rate controlled No AC at this time due to ICH  Hypertension Hypotension after IV hydralazine  Home meds: Lasix , metoprolol  Stable on the low end, continue IVF for now Now on metoprolol ->d/c due to bradycardia BP goal < 160 Cleviprex off PRN low dose hydralazine  Long term goal normotensive  Hyperlipidemia Home meds: Atorvastatin  40 mg, resumed in hospital LDL 65, goal < 70 Continue statin at discharge  Other Active Problems Recent cataract surgery on left eye, Steroid and cosopt eye drops resumed R eye blind RA RLS Vascular dementia, followed with Dr. Sharri at [facility] day # 3    Sara Cummins, MD PhD Stroke Neurology 01/08/2024 12:25 PM  This patient is critically ill due to ICH, stroke, AFib now off [facility], hypertensive emergency and at significant risk of neurological worsening, death form hematoma expansion, cerebral edema, stroke, heart failure, hypertensive encephalopathy. This patient's care requires constant monitoring of vital signs, hemodynamics, respiratory and cardiac monitoring, review of multiple databases, neurological assessment, discussion with family, other specialists and medical decision making of high complexity. I spent 35 minutes of neurocritical care time in the care of this patient. I had long discussion with husband at bedside, updated pt current condition, treatment plan and potential prognosis, and answered all the questions. He expressed understanding and appreciation.      To contact Stroke Continuity provider, please refer to [email or web address]. After hours, contact General Neurology

## 2024-01-08 NOTE — NC FL2 (Signed)
 [place]  MEDICAID FL2 LEVEL OF CARE FORM     IDENTIFICATION  Patient Name: Sara Snow Birthdate: [birth year] Sex: female Admission Date (Current Location): 01/05/2024  [facility] and [place] Number:  Producer, Television/film/video and Address:  [facility], [address] [place], [place] [address]      Provider Number: [id number]  Attending Physician Name and Address:  Stroke, Md, MD  Relative Name and Phone Number:       Current Level of Care: Hospital Recommended Level of Care: Skilled Nursing Facility Prior Approval Number:    Date Approved/Denied:   PASRR Number: [id number]  Discharge Plan: SNF    Current Diagnoses: Patient Active Problem List   Diagnosis Date Noted   ICH (intracerebral hemorrhage) (HCC) 01/05/2024   Encounter for monitoring amiodarone  therapy 01/03/2023   Persistent atrial fibrillation (HCC) 12/02/2022   Hypercoagulable state due to persistent atrial fibrillation (HCC) 12/02/2022   Melena 09/21/2022   Hypothyroidism 09/21/2022   Hypokalemia 09/21/2022   Acute diastolic heart failure (HCC) 09/20/2022   Atrial fibrillation with RVR (HCC) 09/19/2022   Acute congestive heart failure (HCC) 09/19/2022   Uncontrolled hypertension 09/19/2022   Anemia 09/19/2022    Orientation RESPIRATION BLADDER Height & Weight     Self, Situation, Place  Normal Continent, Indwelling catheter Weight: 124 lb 5.4 oz (56.4 kg) Height:  5' 2.5 (158.8 cm)  BEHAVIORAL SYMPTOMS/MOOD NEUROLOGICAL BOWEL NUTRITION STATUS      Continent Diet (See dc summary)  AMBULATORY STATUS COMMUNICATION OF NEEDS Skin   Limited Assist Verbally Normal                       Personal Care Assistance Level of Assistance  Bathing, Feeding, Dressing Bathing Assistance: Limited assistance Feeding assistance: Limited assistance Dressing Assistance: Limited assistance     Functional Limitations Info             SPECIAL CARE FACTORS FREQUENCY  PT (By  licensed PT), OT (By licensed OT)     PT Frequency: 5x/week OT Frequency: 5x/week            Contractures Contractures Info: Not present    Additional Factors Info  Code Status, Allergies Code Status Info: Full Allergies Info: Plaquenil (Hydroxychloroquine), Reglan (Metoclopramide), Amlodipine-valsartan-hctz, Celebrex (Celecoxib)           Current Medications (01/08/2024):  This is the current hospital active medication list Current Facility-Administered Medications  Medication Dose Route Frequency Provider Last Rate Last Admin   0.9 %  sodium chloride  infusion   Intravenous Continuous Jerri Pfeiffer, MD 50 mL/hr at 01/08/24 1200 Infusion Verify at 01/08/24 1200   acetaminophen  (TYLENOL ) tablet 650 mg  650 mg Oral Q4H PRN Michaela Aisha SQUIBB, MD   650 mg at 01/07/24 2222   Or   acetaminophen  (TYLENOL ) 160 MG/5ML solution 650 mg  650 mg Per Tube Q4H PRN Michaela Aisha SQUIBB, MD       Or   acetaminophen  (TYLENOL ) suppository 650 mg  650 mg Rectal Q4H PRN Michaela Aisha SQUIBB, MD       amiodarone  (PACERONE ) tablet 200 mg  200 mg Oral Daily Michaela Aisha SQUIBB, MD   200 mg at 01/08/24 1017   artificial tears ophthalmic solution 1 drop  1 drop Both Eyes PRN Remi Pippin, NP   1 drop at 01/07/24 1350   atorvastatin  (LIPITOR) tablet 40 mg  40 mg Oral Q supper Michaela Aisha SQUIBB, MD   40 mg at 01/07/24 1713  Chlorhexidine Gluconate Cloth 2 % PADS 6 each  6 each Topical Q0600 Jerri Pfeiffer, MD   6 each at 01/08/24 0622   dorzolamide-timolol (COSOPT) 2-0.5 % ophthalmic solution 1 drop  1 drop Both Eyes BID Remi Pippin, NP   1 drop at 01/08/24 1017   enoxaparin  (LOVENOX ) injection 30 mg  30 mg Subcutaneous Daily Jerri Pfeiffer, MD   30 mg at 01/08/24 1019   gabapentin  (NEURONTIN ) capsule 100 mg  100 mg Oral Daily PRN Remi Pippin, NP       gabapentin  (NEURONTIN ) capsule 200 mg  200 mg Oral QHS Remi Pippin, NP   200 mg at 01/07/24 2117   hydrALAZINE (APRESOLINE) injection 5-10  mg  5-10 mg Intravenous Q4H PRN Jerri Pfeiffer, MD       levothyroxine  (SYNTHROID ) tablet 75 mcg  75 mcg Oral QAC breakfast Michaela Aisha SQUIBB, MD   75 mcg at 01/08/24 9377   Oral care mouth rinse  15 mL Mouth Rinse PRN Jerri Pfeiffer, MD       pantoprazole  (PROTONIX ) EC tablet 40 mg  40 mg Oral QHS Jerri Pfeiffer, MD   40 mg at 01/07/24 2117   prednisoLONE acetate (PRED FORTE) 1 % ophthalmic suspension 2 drop  2 drop Left Eye QID Remi Pippin, NP   2 drop at 01/08/24 1017   senna-docusate (Senokot-S) tablet 1 tablet  1 tablet Oral BID Michaela Aisha SQUIBB, MD   1 tablet at 01/08/24 1016     Discharge Medications: Please see discharge summary for a list of discharge medications.  Relevant Imaging Results:  Relevant Lab Results:   Additional Information SSN    [id number]  Inocente GORMAN Kindle, LCSW

## 2024-01-09 LAB — CBC
HCT: 32.1 % — ABNORMAL LOW (ref 36.0–46.0)
Hemoglobin: 10.6 g/dL — ABNORMAL LOW (ref 12.0–15.0)
MCH: 30.7 pg (ref 26.0–34.0)
MCHC: 33 g/dL (ref 30.0–36.0)
MCV: 93 fL (ref 80.0–100.0)
Platelets: 142 K/uL — ABNORMAL LOW (ref 150–400)
RBC: 3.45 MIL/uL — ABNORMAL LOW (ref 3.87–5.11)
RDW: 16.4 % — ABNORMAL HIGH (ref 11.5–15.5)
WBC: 6.9 K/uL (ref 4.0–10.5)
nRBC: 0 % (ref 0.0–0.2)

## 2024-01-09 LAB — BASIC METABOLIC PANEL WITH GFR
Anion gap: 6 (ref 5–15)
BUN: 16 mg/dL (ref 8–23)
CO2: 21 mmol/L — ABNORMAL LOW (ref 22–32)
Calcium: 7.8 mg/dL — ABNORMAL LOW (ref 8.9–10.3)
Chloride: 106 mmol/L (ref 98–111)
Creatinine, Ser: 0.83 mg/dL (ref 0.44–1.00)
GFR, Estimated: >60 mL/min (ref >60)
Glucose, Bld: 87 mg/dL (ref 70–99)
Potassium: 4 mmol/L (ref 3.5–5.1)
Sodium: 133 mmol/L — ABNORMAL LOW (ref 135–145)

## 2024-01-09 MED ORDER — BETHANECHOL CHLORIDE 10 MG PO TABS
10.0000 mg | ORAL_TABLET | Freq: Three times a day (TID) | ORAL | Status: DC
  Administered 2024-01-09 – 2024-01-10 (×4): 10 mg via ORAL
  Filled 2024-01-09 (×4): qty 1

## 2024-01-09 MED ORDER — PREDNISOLONE ACETATE 1 % OP SUSP
2.0000 [drp] | Freq: Four times a day (QID) | OPHTHALMIC | Status: AC
  Administered 2024-01-09 (×2): 2 [drp] via OPHTHALMIC
  Filled 2024-01-09: qty 5

## 2024-01-09 MED ORDER — AMLODIPINE BESYLATE 5 MG PO TABS
5.0000 mg | ORAL_TABLET | Freq: Every day | ORAL | Status: DC
  Administered 2024-01-09 – 2024-01-10 (×2): 5 mg via ORAL
  Filled 2024-01-09 (×2): qty 1

## 2024-01-09 MED ORDER — BISACODYL 10 MG RE SUPP
10.0000 mg | Freq: Once | RECTAL | Status: AC
  Administered 2024-01-09: 10 mg via RECTAL
  Filled 2024-01-09: qty 1

## 2024-01-09 MED ORDER — PREDNISOLONE ACETATE 1 % OP SUSP
2.0000 [drp] | Freq: Three times a day (TID) | OPHTHALMIC | Status: DC
  Administered 2024-01-10: 2 [drp] via OPHTHALMIC
  Filled 2024-01-09: qty 5

## 2024-01-09 NOTE — Plan of Care (Signed)
  Problem: Education: Goal: Knowledge of disease or condition will improve Outcome: Progressing   Problem: Coping: Goal: Will verbalize positive feelings about self Outcome: Progressing Goal: Will identify appropriate support needs Outcome: Progressing   Problem: Health Behavior/Discharge Planning: Goal: Ability to manage health-related needs will improve Outcome: Progressing   Problem: Nutrition: Goal: Risk of aspiration will decrease Outcome: Progressing Goal: Dietary intake will improve Outcome: Progressing   Problem: Education: Goal: Knowledge of General Education information will improve Description: Including pain rating scale, medication(s)/side effects and non-pharmacologic comfort measures Outcome: Progressing   Problem: Clinical Measurements: Goal: Respiratory complications will improve Outcome: Progressing Goal: Cardiovascular complication will be avoided Outcome: Progressing   Problem: Activity: Goal: Risk for activity intolerance will decrease Outcome: Progressing   Problem: Nutrition: Goal: Adequate nutrition will be maintained Outcome: Progressing

## 2024-01-09 NOTE — Plan of Care (Signed)
  Problem: Elimination: Goal: Will not experience complications related to bowel motility Outcome: Not Progressing Goal: Will not experience complications related to urinary retention Outcome: Not Progressing   Problem: Intracerebral Hemorrhage Tissue Perfusion: Goal: Complications of Intracerebral Hemorrhage will be minimized Outcome: Progressing   Problem: Coping: Goal: Will verbalize positive feelings about self Outcome: Progressing   Problem: Health Behavior/Discharge Planning: Goal: Goals will be collaboratively established with patient/family Outcome: Progressing   Problem: Nutrition: Goal: Risk of aspiration will decrease Outcome: Progressing Goal: Dietary intake will improve Outcome: Progressing   Problem: Nutrition: Goal: Adequate nutrition will be maintained Outcome: Progressing   Problem: Pain Managment: Goal: General experience of comfort will improve and/or be controlled Outcome: Progressing

## 2024-01-09 NOTE — TOC Progression Note (Signed)
 Transition of Care [facility]) - Progression Note    Patient Details  Name: Sara Snow MRN: [id number] Date of Birth: 20-Apr-1940  [facility] of [facility]) CM/SW Contact  Inocente GORMAN Kindle, LCSW Phone Number: 01/09/2024, 3:18 PM  Clinical Narrative:    CSW met with patient and spouse at bedside to discuss discharge plan. Patient reluctantly reported agreement to go to the SNF rehab side of [place] though she would like to get home soon. CSW confirmed with spouse that she will require PTAR for transport for safety as husband stated he is not comfortable putting her in the car.    Expected Discharge Plan: Skilled Nursing Facility Barriers to Discharge: Continued Medical Work up               Expected Discharge Plan and Services In-house Referral: Clinical Social Work     Living arrangements for the past 2 months: Independent Living Facility                                       Social Drivers of Health (SDOH) Interventions SDOH Screenings   Food Insecurity: No Food Insecurity (01/06/2024)  Housing: Low Risk  (01/06/2024)  Transportation Needs: No Transportation Needs (01/06/2024)  Utilities: Not At Risk (01/06/2024)  Social Connections: Moderately Integrated (01/06/2024)  Tobacco Use: Low Risk  (01/06/2024)    Readmission Risk Interventions     No data to display

## 2024-01-09 NOTE — Progress Notes (Addendum)
 STROKE TEAM PROGRESS NOTE   INTERIM HISTORY/SUBJECTIVE Husband is at the bedside. Patient is awake and alert and sitting up in bed in NAD.  Patient has not had a BM so will add bowel regimen and will dc foley for voiding trial. Will hold off on discharge today.  Likely discharge to SNF tomorrow   CBC    Component Value Date/Time   WBC 6.9 01/09/2024 0502   RBC 3.45 (L) 01/09/2024 0502   HGB 10.6 (L) 01/09/2024 0502   HGB 12.3 06/05/2023 1502   HCT 32.1 (L) 01/09/2024 0502   HCT 38.2 06/05/2023 1502   PLT 142 (L) 01/09/2024 0502   PLT 196 06/05/2023 1502   MCV 93.0 01/09/2024 0502   MCV 96 06/05/2023 1502   MCH 30.7 01/09/2024 0502   MCHC 33.0 01/09/2024 0502   RDW 16.4 (H) 01/09/2024 0502   RDW 12.3 06/05/2023 1502   LYMPHSABS 1.4 01/05/2024 2038   LYMPHSABS 1.2 06/05/2023 1502   MONOABS 0.6 01/05/2024 2038   EOSABS 0.1 01/05/2024 2038   EOSABS 0.1 06/05/2023 1502   BASOSABS 0.1 01/05/2024 2038   BASOSABS 0.0 06/05/2023 1502    BMET    Component Value Date/Time   NA 133 (L) 01/09/2024 0502   NA 136 07/27/2023 1357   K 4.0 01/09/2024 0502   CL 106 01/09/2024 0502   CO2 21 (L) 01/09/2024 0502   GLUCOSE 87 01/09/2024 0502   BUN 16 01/09/2024 0502   BUN 23 07/27/2023 1357   CREATININE 0.83 01/09/2024 0502   CALCIUM  7.8 (L) 01/09/2024 0502   EGFR 52 (L) 07/27/2023 1357   GFRNONAA >60 01/09/2024 0502    IMAGING past 24 hours No results found.   Vitals:   01/09/24 0800 01/09/24 0900 01/09/24 1100 01/09/24 1130  BP:  (!) 140/73 (!) 101/53 (!) 103/48  Pulse:  64 63 (!) 57  Resp:  19 (!) 24 16  Temp: 98.1 F (36.7 C)     TempSrc: Oral     SpO2:  97% 97% 97%  Weight:      Height:         PHYSICAL EXAM General:  Alert, well-nourished, well-developed patient in no acute distress CV: Regular rate and rhythm on monitor Respiratory:  Regular, unlabored respirations on room air GI: Abdomen soft and nontender  NEURO:  Patient awake, alert, eyes open,  orientated to age, place, time. No aphasia but paucity of speech, following all simple commands, mild dysarthria. Able to name and repeat in dysarthric voice. 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  Mild left facial droop. Tongue midline. Left arm slight drift with mild ataxia, left lower extremity 4/5 also with mild ataxia, left lower extremity and face decreased light touch sensation. Right upper and lower extremity 4/5, no ataxia., gait not tested.    ASSESSMENT/PLAN  [name] is a 83 y.o. female with history of  previous stroke as well as atrial fibrillation on anticoagulation with Eliquis  who was in her normal state of health until she noticed that she was dropping things about 5:30 PM.  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 New 1.2 cm hyperdensity in the inferior right cerebellum could represent additional hemorrhage. Remote left PCA territory infarct. CTA head & neck No large vessel occlusion, hemodynamically significant stenosis, or aneurysm in the head or neck. MRI  Small acute hemorrhage in the right thalamus (1 mL) with regional edema but no significant mass effect. Superimposed small acute Right SCA cerebellar infarct without hemorrhagic transformation or mass effect. Extensive chronic hemorrhage in the brain, with pattern slightly favoring advanced small vessel disease rather than amyloid angiopathy. 2D Echo EF 65-70%  LDL 65 HgbA1c 5.3 VTE prophylaxis - lovenox  Eliquis  (apixaban ) daily prior to admission, now on No antithrombotic due to ICH. Recommend repeating CT head in  2-3 weeks to determine if can restart Eliquis   Therapy recommendations:  SNF Disposition:  Pending   Hx of Stroke/TIA 03/2021 Left PCA infarct with residual right hemianopia. 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, modereate R M2 stenosis 10/2022 Diagnosed with afib and put on eliquis  2.5mg  BID  Atrial fibrillation Found in 10/2022, put on eliquis  Home Meds also include Amiodarone , metoprolol    Continue telemetry monitoring HR 55-65, rate controlled No AC at this time due to ICH  Hypertension Hypotension after IV hydralazine  Home meds: Lasix , metoprolol  Stable on the high end dc metoprolol  due to bradycardia BP goal < 160 Cleviprex off Put on amlodipine 5 Long term goal normotensive  Hyperlipidemia Home meds: Atorvastatin  40 mg, resumed in hospital LDL 65, goal < 70 Continue statin at discharge  Urine retention  Foley placed yesterday  Will do viding trial today  Start urecholine today   Other Active Problems Recent cataract surgery on left eye, Steroid and cosopt eye drops resumed R eye blind RA RLS Vascular dementia, followed with Dr. Sharri at [facility] Constipation, on bowel regimen  Hospital day # 4    Karna Geralds DNP, ACNPC-AG  [facility]  ATTENDING NOTE: I reviewed above note and agree with the assessment and plan. Pt was seen and examined.   Husband at bedside.  Patient sitting bed having breakfast, no headache no dizziness.  Neuro stable, unchanged.  BP slightly on the higher end, start amlodipine 5.  Has not had bowel movement for last several days, start bowel regimen.  Also had urinary retention, put on Foley catheter yesterday.  Start Urecholine today and may consider voiding trial tomorrow.  PT and OT recommend SNF.  For detailed assessment and plan, please refer to above as I have made changes wherever appropriate.   Ary Cummins, MD PhD Stroke Neurology 01/09/2024 5:51 PM     To contact Stroke Continuity provider, please refer  to [email or web address]. After hours, contact General Neurology

## 2024-01-09 NOTE — Progress Notes (Signed)
 Physical Therapy Treatment Patient Details Name: Besse Miron MRN: [id number] DOB: 11/21/1940 Today's Date: 01/09/2024   History of Present Illness 83 y.o. female presented 01/05/24 after noticing she was dropping things. CT head- two areas of hyperintensity concerning for hemorrhage (R thalamic, R cerebellar). NIHSS=7  PMH - htn, CVA, afib on anticoagulation, RA, blind R eye    PT Comments  Patient not dizzy today, however still with orthostasis (see BPs, HRs below). On return to room from walking (80 ft with rollator and CGA) she was unable to stand long enough for standing BP due to dyspnea but denied dizziness. Overall, improved mobility with need for less physical assist, however continue to feel post-acute inpatient therapies <3 hrs/day is most appropriate discharge plan due to continued orthostasis and need for close monitoring.      01/09/24 1052  Vital Signs  Patient Position (if appropriate) Orthostatic Vitals  Orthostatic Lying   BP- Lying 152/59  Pulse- Lying 65  Orthostatic Sitting  BP- Sitting 148/62  Pulse- Sitting 63  Orthostatic Standing at 0 minutes  BP- Standing at 0 minutes 131/60  Pulse- Standing at 0 minutes 73  Orthostatic Standing at 3 minutes  BP- Standing at 3 minutes 129/61  Pulse- Standing at 3 minutes 70        BP sitting after walking   125/56 HR 71   If plan is discharge home, recommend the following: A little help with walking and/or transfers;Assistance with cooking/housework;Assistance with feeding;Direct supervision/assist for medications management;Direct supervision/assist for financial management;Assist for transportation;Supervision due to cognitive status   Can travel by private vehicle     Yes  Equipment Recommendations  None recommended by PT    Recommendations for Other Services       Precautions / Restrictions Precautions Precautions: Fall;Other (comment) Recall of Precautions/Restrictions: Intact Precaution/Restrictions  Comments: R blindness; L with R hemianopsia Restrictions Weight Bearing Restrictions Per Provider Order: No     Mobility  Bed Mobility Overal bed mobility: Needs Assistance Bed Mobility: Supine to Sit     Supine to sit: HOB elevated, Contact guard     General bed mobility comments: guarding for safety on tall ICU bed    Transfers Overall transfer level: Needs assistance Equipment used: Rollator (4 wheels) Transfers: Sit to/from Stand Sit to Stand: Contact guard assist                Ambulation/Gait Ambulation/Gait assistance: Contact guard assist Gait Distance (Feet): 80 Feet Assistive device: Rollator (4 wheels) Gait Pattern/deviations: Step-through pattern, Decreased stride length   Gait velocity interpretation: <1.8 ft/sec, indicate of risk for recurrent falls   General Gait Details: required some cues due to decr vision, however able to maneuver rollator without physical assist   Stairs             Wheelchair Mobility     Tilt Bed    Modified Rankin (Stroke Patients Only) Modified Rankin (Stroke Patients Only) Pre-Morbid Rankin Score: Moderately severe disability Modified Rankin: Moderately severe disability     Balance Overall balance assessment: Needs assistance Sitting-balance support: No upper extremity supported, Feet supported Sitting balance-Leahy Scale: Fair     Standing balance support: Bilateral upper extremity supported Standing balance-Leahy Scale: Poor                              Communication Communication Communication: Impaired Factors Affecting Communication: Difficulty expressing self (word finding issues)  Cognition Arousal: Alert Behavior During Therapy:  WFL for tasks assessed/performed                             Following commands: Impaired Following commands impaired: Only follows one step commands consistently    Cueing Cueing Techniques: Verbal cues, Tactile cues  Exercises       General Comments        Pertinent Vitals/Pain Pain Assessment Pain Assessment: No/denies pain    Home Living                          Prior Function            PT Goals (current goals can now be found in the care plan section) Acute Rehab PT Goals Patient Stated Goal: return home with [facility] Time For Goal Achievement: 01/21/24 Potential to Achieve Goals: Good Progress towards PT goals: Progressing toward goals    Frequency    Min 2X/week      PT Plan      Co-evaluation              AM-PAC PT 6 Clicks Mobility   Outcome Measure  Help needed turning from your back to your side while in a flat bed without using bedrails?: A Little Help needed moving from lying on your back to sitting on the side of a flat bed without using bedrails?: A Little Help needed moving to and from a bed to a chair (including a wheelchair)?: A Little Help needed standing up from a chair using your arms (e.g., wheelchair or bedside chair)?: A Little Help needed to walk in hospital room?: A Little Help needed climbing 3-5 steps with a railing? : Total 6 Click Score: 16    End of Session Equipment Utilized During Treatment: Gait belt Activity Tolerance: Patient tolerated treatment well Patient left: in chair;with call bell/phone within reach;with family/visitor present;with nursing/sitter in room (chair alarm under pt, no alarm box in room) Nurse Communication: Mobility status;Other (comment) (+orthostasis, asymptomatic) PT Visit Diagnosis: Unsteadiness on feet (R26.81);Muscle weakness (generalized) (M62.81);Difficulty in walking, not elsewhere classified (R26.2);Other symptoms and signs involving the nervous system (R29.898);Dizziness and giddiness (R42)     Time: 1027-1050 PT Time Calculation (min) (ACUTE ONLY): 23 min  Charges:    $Gait Training: 8-22 mins PT General Charges $$ ACUTE PT VISIT: 1 Visit                      Macario RAMAN, PT Acute Rehabilitation Services   Office [phone]    Macario SHAUNNA Soja 01/09/2024, 10:59 AM

## 2024-01-10 DIAGNOSIS — R29706 NIHSS score 6: Secondary | ICD-10-CM

## 2024-01-10 DIAGNOSIS — I63541 Cerebral infarction due to unspecified occlusion or stenosis of right cerebellar artery: Secondary | ICD-10-CM | POA: Insufficient documentation

## 2024-01-10 DIAGNOSIS — R339 Retention of urine, unspecified: Secondary | ICD-10-CM | POA: Insufficient documentation

## 2024-01-10 DIAGNOSIS — E785 Hyperlipidemia, unspecified: Secondary | ICD-10-CM | POA: Insufficient documentation

## 2024-01-10 LAB — BASIC METABOLIC PANEL WITH GFR
Anion gap: 8 (ref 5–15)
BUN: 16 mg/dL (ref 8–23)
CO2: 22 mmol/L (ref 22–32)
Calcium: 8.4 mg/dL — ABNORMAL LOW (ref 8.9–10.3)
Chloride: 101 mmol/L (ref 98–111)
Creatinine, Ser: 1.03 mg/dL — ABNORMAL HIGH (ref 0.44–1.00)
GFR, Estimated: 55 mL/min — ABNORMAL LOW (ref >60)
Glucose, Bld: 95 mg/dL (ref 70–99)
Potassium: 4.2 mmol/L (ref 3.5–5.1)
Sodium: 131 mmol/L — ABNORMAL LOW (ref 135–145)

## 2024-01-10 LAB — CBC
HCT: 30.5 % — ABNORMAL LOW (ref 36.0–46.0)
Hemoglobin: 10 g/dL — ABNORMAL LOW (ref 12.0–15.0)
MCH: 30.4 pg (ref 26.0–34.0)
MCHC: 32.8 g/dL (ref 30.0–36.0)
MCV: 92.7 fL (ref 80.0–100.0)
Platelets: 137 K/uL — ABNORMAL LOW (ref 150–400)
RBC: 3.29 MIL/uL — ABNORMAL LOW (ref 3.87–5.11)
RDW: 16 % — ABNORMAL HIGH (ref 11.5–15.5)
WBC: 7.4 K/uL (ref 4.0–10.5)
nRBC: 0 % (ref 0.0–0.2)

## 2024-01-10 MED ORDER — BETHANECHOL CHLORIDE 10 MG PO TABS: 10.0000 mg | ORAL_TABLET | Freq: Three times a day (TID) | ORAL | Status: AC

## 2024-01-10 MED ORDER — SENNOSIDES-DOCUSATE SODIUM 8.6-50 MG PO TABS: 1.0000 | ORAL_TABLET | Freq: Two times a day (BID) | ORAL | Status: AC

## 2024-01-10 MED ORDER — AMLODIPINE BESYLATE 5 MG PO TABS: 5.0000 mg | ORAL_TABLET | Freq: Every day | ORAL | Status: AC

## 2024-01-10 MED ORDER — GABAPENTIN 100 MG PO CAPS: 200.0000 mg | ORAL_CAPSULE | Freq: Every day | ORAL | Status: AC

## 2024-01-10 MED ORDER — GABAPENTIN 100 MG PO CAPS: 100.0000 mg | ORAL_CAPSULE | Freq: Every day | ORAL | Status: AC | PRN

## 2024-01-10 MED ORDER — ENOXAPARIN SODIUM 40 MG/0.4ML IJ SOSY: 40.0000 mg | PREFILLED_SYRINGE | Freq: Every day | INTRAMUSCULAR | Status: DC

## 2024-01-10 NOTE — Care Management Important Message (Signed)
 Important Message  Patient Details  Name: Sara Snow MRN: [id number] Date of Birth: 1940-08-19   Important Message Given:  Yes - Medicare IM     Claretta Deed 01/10/2024, 3:03 PM

## 2024-01-10 NOTE — Progress Notes (Signed)
 Pt discharged to [facility] via ambulance services w/[facility]. Pts IV and tele box removed prior to discharge. Report called to nurse at facility. Pts belongings gathered and taken home by family. Report given to [facility] on arrival and pt transported off unit

## 2024-01-10 NOTE — Progress Notes (Signed)
 Occupational Therapy Treatment Patient Details Name: Sara Snow MRN: [id number] DOB: 06/20/1940 Today's Date: 01/10/2024   History of present illness 83 y.o. female presented 01/05/24 after noticing she was dropping things. CT head- two areas of hyperintensity concerning for hemorrhage (R thalamic, R cerebellar). NIHSS=7  PMH - htn, CVA, afib on anticoagulation, RA, blind R eye   OT comments  Pt progressing toward established OT goals. Challenging strength, balance, activity tolerance, and vision with functional mobility and standing BADL at sink with up to min A. Additionally challenging BUE strength with UE theraband seated EOB. Will continue to follow. Patient will benefit from continued inpatient follow up therapy, <3 hours/day       If plan is discharge home, recommend the following:  A little help with walking and/or transfers;A little help with bathing/dressing/bathroom;Assistance with cooking/housework;Help with stairs or ramp for entrance;Assist for transportation;Direct supervision/assist for financial management;Direct supervision/assist for medications management   Equipment Recommendations  Other (comment) (defer)    Recommendations for Other Services      Precautions / Restrictions Precautions Precautions: Fall;Other (comment) Recall of Precautions/Restrictions: Intact Precaution/Restrictions Comments: R blindness; L with R hemianopsia Restrictions Weight Bearing Restrictions Per Provider Order: No       Mobility Bed Mobility Overal bed mobility: Needs Assistance Bed Mobility: Supine to Sit, Sit to Supine     Supine to sit: HOB elevated, Contact guard Sit to supine: Min assist   General bed mobility comments: light assist to bring BLE into bed    Transfers Overall transfer level: Needs assistance Equipment used: Rolling walker (2 wheels) Transfers: Sit to/from Stand Sit to Stand: Min assist           General transfer comment: VC for hand  placement and light boost up     Balance Overall balance assessment: Needs assistance Sitting-balance support: No upper extremity supported, Feet supported Sitting balance-Leahy Scale: Fair     Standing balance support: Bilateral upper extremity supported Standing balance-Leahy Scale: Poor                             ADL either performed or assessed with clinical judgement   ADL Overall ADL's : Needs assistance/impaired     Grooming: Minimal assistance;Standing;Oral care;Moderate assistance Grooming Details (indicate cue type and reason): assist predominantly due to low vision                 Toilet Transfer: Minimal assistance;Contact guard assist;Ambulation;Rolling walker (2 wheels)           Functional mobility during ADLs: Contact guard assist;Minimal assistance;Rolling walker (2 wheels)      Extremity/Trunk Assessment Upper Extremity Assessment Upper Extremity Assessment: Generalized weakness   Lower Extremity Assessment Lower Extremity Assessment: Defer to PT evaluation        Vision   Vision Assessment?: Vision impaired- to be further tested in functional context;Yes Visual Fields: Right homonymous hemianopsia;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 ; cut appears to be worse in R upper quadrant than lower)   Perception     Praxis     Communication Communication Communication: Impaired Factors Affecting Communication: Difficulty expressing self (difficulty word finding at times.)   Cognition Arousal: Alert Behavior During Therapy: WFL for tasks assessed/performed Cognition: Cognition impaired     Awareness: Online awareness impaired Memory impairment (select all impairments): Short-term memory Attention impairment (select first level of impairment): Selective attention, Alternating attention Executive functioning impairment (select all impairments): Organization, Problem solving  Following  commands: Impaired Following commands impaired: Only follows one step commands consistently      Cueing   Cueing Techniques: Verbal cues, Tactile cues  Exercises Exercises: Other exercises Other Exercises Other Exercises: BUE bicep curl, seated row [id number]    Shoulder Instructions       General Comments      Pertinent Vitals/ Pain       Pain Assessment Pain Assessment: No/denies pain  Home Living                                          Prior Functioning/Environment              Frequency  Min 2X/week        Progress Toward Goals  OT Goals(current goals can now be found in the care plan section)  Progress towards OT goals: Progressing toward goals  Acute Rehab OT Goals Patient Stated Goal: get better OT Goal Formulation: With patient Time For Goal Achievement: 01/22/24 Potential to Achieve Goals: Good ADL Goals Pt Will Perform Grooming: with supervision;standing Pt Will Perform Lower Body Dressing: with supervision;sit to/from stand Pt Will Transfer to Toilet: with supervision;ambulating Pt/caregiver will Perform Home Exercise Program: Both right and left upper extremity;Increased strength  Plan      Co-evaluation                 AM-PAC OT 6 Clicks Daily Activity     Outcome Measure   Help from another person eating meals?: A Little Help from another person taking care of personal grooming?: A Little Help from another person toileting, which includes using toliet, bedpan, or urinal?: A Lot Help from another person bathing (including washing, rinsing, drying)?: A Lot Help from another person to put on and taking off regular upper body clothing?: A Little Help from another person to put on and taking off regular lower body clothing?: A Lot 6 Click Score: 15    End of Session Equipment Utilized During Treatment: Gait belt;Rolling walker (2 wheels)  OT Visit Diagnosis: Unsteadiness on feet (R26.81);Muscle weakness  (generalized) (M62.81);Other symptoms and signs involving cognitive function;Low vision, both eyes (H54.2)   Activity Tolerance Patient tolerated treatment well   Patient Left in bed;with call bell/phone within reach;with bed alarm set;with family/visitor present;with nursing/sitter in room   Nurse Communication Mobility status        Time: 8641-8564 OT Time Calculation (min): 37 min  Charges: OT General Charges $OT Visit: 1 Visit OT Treatments $Self Care/Home Management : 8-22 mins $Therapeutic Exercise: 8-22 mins  Elma JONETTA Penner, OTD, OTR/L [facility] Acute Rehabilitation Office: [phone]   Elma JONETTA Penner 01/10/2024, 2:49 PM

## 2024-01-10 NOTE — Plan of Care (Signed)
  Problem: Education: Goal: Knowledge of disease or condition will improve Outcome: Adequate for Discharge Goal: Knowledge of secondary prevention will improve (MUST DOCUMENT ALL) Outcome: Adequate for Discharge Goal: Knowledge of patient specific risk factors will improve (DELETE if not current risk factor) Outcome: Adequate for Discharge   Problem: Intracerebral Hemorrhage Tissue Perfusion: Goal: Complications of Intracerebral Hemorrhage will be minimized Outcome: Adequate for Discharge   Problem: Coping: Goal: Will verbalize positive feelings about self Outcome: Adequate for Discharge Goal: Will identify appropriate support needs Outcome: Adequate for Discharge   Problem: Health Behavior/Discharge Planning: Goal: Ability to manage health-related needs will improve Outcome: Adequate for Discharge Goal: Goals will be collaboratively established with patient/family Outcome: Adequate for Discharge   Problem: Self-Care: Goal: Ability to participate in self-care as condition permits will improve Outcome: Adequate for Discharge Goal: Verbalization of feelings and concerns over difficulty with self-care will improve Outcome: Adequate for Discharge Goal: Ability to communicate needs accurately will improve Outcome: Adequate for Discharge   Problem: Nutrition: Goal: Risk of aspiration will decrease Outcome: Adequate for Discharge Goal: Dietary intake will improve Outcome: Adequate for Discharge   Problem: Education: Goal: Knowledge of General Education information will improve Description: Including pain rating scale, medication(s)/side effects and non-pharmacologic comfort measures Outcome: Adequate for Discharge   Problem: Health Behavior/Discharge Planning: Goal: Ability to manage health-related needs will improve Outcome: Adequate for Discharge   Problem: Clinical Measurements: Goal: Ability to maintain clinical measurements within normal limits will improve Outcome:  Adequate for Discharge Goal: Will remain free from infection Outcome: Adequate for Discharge Goal: Diagnostic test results will improve Outcome: Adequate for Discharge Goal: Respiratory complications will improve Outcome: Adequate for Discharge Goal: Cardiovascular complication will be avoided Outcome: Adequate for Discharge   Problem: Activity: Goal: Risk for activity intolerance will decrease Outcome: Adequate for Discharge   Problem: Nutrition: Goal: Adequate nutrition will be maintained Outcome: Adequate for Discharge   Problem: Coping: Goal: Level of anxiety will decrease Outcome: Adequate for Discharge   Problem: Elimination: Goal: Will not experience complications related to bowel motility Outcome: Adequate for Discharge Goal: Will not experience complications related to urinary retention Outcome: Adequate for Discharge   Problem: Pain Managment: Goal: General experience of comfort will improve and/or be controlled Outcome: Adequate for Discharge   Problem: Safety: Goal: Ability to remain free from injury will improve Outcome: Adequate for Discharge   Problem: Skin Integrity: Goal: Risk for impaired skin integrity will decrease Outcome: Adequate for Discharge

## 2024-01-10 NOTE — Progress Notes (Signed)
 Speech Language Pathology Treatment: Cognitive-Linguistic  Patient Details Name: Cleotilde Spadaccini MRN: [id number] DOB: 1940-07-09 Today's Date: 01/10/2024 Time: 8695-8683 SLP Time Calculation (min) (ACUTE ONLY): 12 min  Assessment / Plan / Recommendation Clinical Impression  SLP conducted skilled therapy session targeting language goals. SLP facilitated various naming tasks targeting responsive and generative naming skills. Across all tasks, patient only required supervision assist to name all targets and demonstrated independent mastery of circumlocution strategy to reference words that were not immediately available to her. Patient was left in room with call bell in reach and alarm set. SLP will continue to target goals per plan of care.      HPI HPI: 83 y.o. female presented 01/05/24 after noticing she was dropping things. CT head- two areas of hyperintensity concerning for hemorrhage (R thalamic, R cerebellar). NIHSS=7  PMH - htn, CVA, afib on anticoagulation, RA, blind R eye      SLP Plan  Continue with current plan of care             Oral care BID   PRN Aphasia (R47.01)     Continue with current plan of care     Rosina A Keymarion Bearman  01/10/2024, 1:24 PM

## 2024-01-10 NOTE — TOC Transition Note (Signed)
 Transition of Care [facility]) - Discharge Note   Patient Details  Name: Sara Snow MRN: [id number] Date of Birth: 1940-07-23  Transition of Care [facility]) CM/SW Contact:  Almarie CHRISTELLA Goodie, LCSW Phone Number: 01/10/2024, 1:46 PM   Clinical Narrative:   CSW updated by MD that patient is stable for discharge, and CSW confirmed with [facility] that bed is available for patient. Discharge information sent to [facility], they are ready for patient. CSW met with patient and spouse at bedside. Patient reported not remembering if she told the doctor about experiencing left-sided facial numbness. CSW relayed information to MD, and they examined patient this morning, it was improving. Patient still stable for discharge. Transport arranged with [facility] for next available.  Nurse to call report to [phone], Room 115.    Final next level of care: Skilled Nursing Facility Barriers to Discharge: Barriers Resolved   Patient Goals and CMS Choice            Discharge Placement              Patient chooses bed at: [facility] at [facility] Patient to be transferred to facility by: [facility] Name of family member notified: Self, spouse at bedside Patient and family notified of of transfer: 01/10/24  Discharge Plan and Services Additional resources added to the After Visit Summary for   In-house Referral: Clinical Social Work                                   Social Drivers of Health (SDOH) Interventions SDOH Screenings   Food Insecurity: No Food Insecurity (01/06/2024)  Housing: Low Risk  (01/06/2024)  Transportation Needs: No Transportation Needs (01/06/2024)  Utilities: Not At Risk (01/06/2024)  Social Connections: Moderately Integrated (01/06/2024)  Tobacco Use: Low Risk  (01/06/2024)     Readmission Risk Interventions     No data to display

## 2024-01-10 NOTE — Discharge Summary (Cosign Needed)
 Stroke Discharge Summary  Patient ID: Sara Snow   MRN: [id number]      DOB: 1940-11-04  Date of Admission: 01/05/2024 Date of Discharge: 01/10/2024  Attending Physician:  Stroke, Md, MD, Stroke MD Patient's PCP:  Feliciano Devoria LABOR, MD  DISCHARGE DIAGNOSIS: ICH:  right thalamic and R cerebellar small ICH, etiology:  likely on AC in the setting of hypertension Stroke: small right cerebellar SCA territory infarct in the setting of afib, briefly off eliquis  for left eye cataract surgery  Principal Problem:   ICH (intracerebral hemorrhage) (HCC) Active Problems:   Atrial fibrillation (HCC)   HTN (hypertension)   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)   HLD (hyperlipidemia)   Urinary retention   Allergies as of 01/10/2024       Reactions   Plaquenil [hydroxychloroquine] Other (See Comments)   Toxicity caused bulls eye vision   Reglan [metoclopramide] Other (See Comments)   nightmares   Amlodipine-valsartan-hctz Rash   Celebrex [celecoxib] Rash   Total body rash        Medication List     STOP taking these medications    Eliquis  2.5 MG Tabs tablet Generic drug: apixaban    UNABLE TO FIND       TAKE these medications    acetaminophen  650 MG CR tablet Commonly known as: TYLENOL  Take 1,300 mg by mouth in the morning, at noon, and at bedtime.   amiodarone  200 MG tablet Commonly known as: PACERONE  Take 1 tablet (200 mg total) by mouth 2 (two) times daily for 30 days, THEN 1 tablet (200 mg total) daily. Start taking on: December 05, 2022 What changed: See the new instructions.   amLODipine 5 MG tablet Commonly known as: NORVASC Take 1 tablet (5 mg total) by mouth daily. Start taking on: January 11, 2024   atorvastatin  40 MG tablet Commonly known as: LIPITOR Take 40 mg by mouth daily with supper.   bethanechol 10 MG tablet Commonly known as: URECHOLINE Take 1 tablet (10 mg total) by mouth 3 (three) times daily.   Calcium   Citrate-Vitamin D 315-5 MG-MCG Tabs Take 1 tablet by mouth daily.   dorzolamide-timolol 2-0.5 % ophthalmic solution Commonly known as: COSOPT Place 1 drop into both eyes 2 (two) times daily.   enoxaparin  40 MG/0.4ML injection Commonly known as: LOVENOX  Inject 0.4 mLs (40 mg total) into the skin daily. Start taking on: January 11, 2024   folic acid  1 MG tablet Commonly known as: FOLVITE  Take 1 mg by mouth daily.   furosemide  20 MG tablet Commonly known as: LASIX  Take 1 tablet (20 mg total) by mouth daily. May take extra tablet daily for wt gain/swelling   gabapentin  100 MG capsule Commonly known as: NEURONTIN  Take 1 capsule (100 mg total) by mouth daily as needed (RLS). What changed:  how much to take when to take this reasons to take this additional instructions   gabapentin  100 MG capsule Commonly known as: NEURONTIN  Take 2 capsules (200 mg total) by mouth at bedtime. What changed: You were already taking a medication with the same name, and this prescription was added. Make sure you understand how and when to take each.   Klor-Con  20 MEQ packet Generic drug: potassium chloride  Take 20 mEq by mouth daily.   levothyroxine  75 MCG tablet Commonly known as: SYNTHROID  Take 75 mcg by mouth daily before breakfast.   metoprolol  succinate 25 MG 24 hr tablet Commonly known as: TOPROL -XL Take 0.5 tablets (12.5 mg total) by  mouth daily.   metoprolol  tartrate 25 MG tablet Commonly known as: LOPRESSOR  Take 1 tablet (25 mg total) by mouth daily as needed (HEART RATE OVER 120).   pantoprazole  40 MG tablet Commonly known as: PROTONIX  Take 1 tablet (40 mg total) by mouth daily. What changed: when to take this   prednisoLONE acetate 1 % ophthalmic suspension Commonly known as: PRED FORTE Administer 1 drop into left eye 4 (four) times a day. Use 4x daily left eye, DO NOT START UNTIL AFTER SURGERY   PreserVision AREDS Caps Take 1 capsule by mouth in the morning and at  bedtime.   Refresh Plus 0.5 % Soln Generic drug: Carboxymethylcellulose Sod PF Place 1 drop into both eyes 6 (six) times daily.   senna-docusate 8.6-50 MG tablet Commonly known as: Senokot-S Take 1 tablet by mouth 2 (two) times daily.   Vitamin D (Ergocalciferol) 1.25 MG (50000 UNIT) Caps capsule Commonly known as: DRISDOL Take 50,000 Units by mouth every 7 (seven) days. Sunday        LABORATORY STUDIES CBC    Component Value Date/Time   WBC 7.4 01/10/2024 0146   RBC 3.29 (L) 01/10/2024 0146   HGB 10.0 (L) 01/10/2024 0146   HGB 12.3 06/05/2023 1502   HCT 30.5 (L) 01/10/2024 0146   HCT 38.2 06/05/2023 1502   PLT 137 (L) 01/10/2024 0146   PLT 196 06/05/2023 1502   MCV 92.7 01/10/2024 0146   MCV 96 06/05/2023 1502   MCH 30.4 01/10/2024 0146   MCHC 32.8 01/10/2024 0146   RDW 16.0 (H) 01/10/2024 0146   RDW 12.3 06/05/2023 1502   LYMPHSABS 1.4 01/05/2024 2038   LYMPHSABS 1.2 06/05/2023 1502   MONOABS 0.6 01/05/2024 2038   EOSABS 0.1 01/05/2024 2038   EOSABS 0.1 06/05/2023 1502   BASOSABS 0.1 01/05/2024 2038   BASOSABS 0.0 06/05/2023 1502   CMP    Component Value Date/Time   NA 131 (L) 01/10/2024 0146   NA 136 07/27/2023 1357   K 4.2 01/10/2024 0146   CL 101 01/10/2024 0146   CO2 22 01/10/2024 0146   GLUCOSE 95 01/10/2024 0146   BUN 16 01/10/2024 0146   BUN 23 07/27/2023 1357   CREATININE 1.03 (H) 01/10/2024 0146   CALCIUM  8.4 (L) 01/10/2024 0146   PROT 7.2 01/05/2024 2038   PROT 7.6 06/05/2023 1502   ALBUMIN 3.7 01/05/2024 2038   ALBUMIN 4.7 06/05/2023 1502   AST 36 01/05/2024 2038   ALT 34 01/05/2024 2038   ALKPHOS 59 01/05/2024 2038   BILITOT 0.9 01/05/2024 2038   BILITOT 0.5 06/05/2023 1502   GFRNONAA 55 (L) 01/10/2024 0146   COAGS Lab Results  Component Value Date   INR 1.1 01/05/2024   Lipid Panel    Component Value Date/Time   CHOL 157 01/07/2024 0342   TRIG 48 01/07/2024 0342   HDL 82 01/07/2024 0342   CHOLHDL 1.9 01/07/2024 0342   VLDL  10 01/07/2024 0342   LDLCALC 65 01/07/2024 0342   HgbA1C  Lab Results  Component Value Date   HGBA1C 5.3 01/07/2024   Urinalysis    Component Value Date/Time   COLORURINE YELLOW 07/29/2023 1329   APPEARANCEUR HAZY (A) 07/29/2023 1329   LABSPEC 1.009 07/29/2023 1329   PHURINE 7.0 07/29/2023 1329   GLUCOSEU NEGATIVE 07/29/2023 1329   HGBUR NEGATIVE 07/29/2023 1329   BILIRUBINUR NEGATIVE 07/29/2023 1329   KETONESUR NEGATIVE 07/29/2023 1329   PROTEINUR NEGATIVE 07/29/2023 1329   NITRITE NEGATIVE 07/29/2023 1329   LEUKOCYTESUR  NEGATIVE 07/29/2023 1329   Urine Drug Screen No results found for: LABOPIA, COCAINSCRNUR, LABBENZ, AMPHETMU, THCU, LABBARB  Alcohol  Level    Component Value Date/Time   [facility] <15 01/05/2024 2038    SIGNIFICANT DIAGNOSTIC STUDIES  EXAM: MRI BRAIN WITHOUT CONTRAST 01/06/2024 06:59:06 AM IMPRESSION: 1. Small acute hemorrhage in the right thalamus (1 mL) with regional edema but no significant mass effect. 2. Superimposed small acute Right SCA cerebellar infarct without hemorrhagic transformation or mass effect. 3. Extensive chronic hemorrhage in the brain, with pattern slightly favoring advanced small vessel disease rather than amyloid angiopathy.  EXAM: CTA HEAD AND NECK WITH AND WITHOUT 01/05/2024 08:54:35 PM IMPRESSION: 1. No large vessel occlusion, hemodynamically significant stenosis, or aneurysm in the head or neck.  EXAM: CT HEAD WITHOUT 01/05/2024 08:44:03 PM IMPRESSION: 1. Acute 1 cm hemorrhage in right thalamus.  No substantial mass effect. 2. New 1.2 cm hyperdensity in the inferior right cerebellum could represent additional hemorrhage versus mineralization. Recommend attention on follow up. 3. Remote left PCA territory infarct and chronic microvascular ischemic change.    HISTORY OF PRESENT ILLNESS 83 y.o. female with history of  previous stroke as well as atrial fibrillation on anticoagulation with Eliquis  who was in her  normal state of health until she noticed that she was dropping things.  NIH on Admission 7    HOSPITAL COURSE ICH:  right thalamic and R cerebellar small ICH, etiology:  likely on AC in the setting of hypertension Stroke: small right cerebellar SCA terriotry infarct in the setting of afib, briefly off eliquis  for left eye cataract surgery  Code Stroke CT head -  Acute 1 cm hemorrhage in right thalamus. New 1.2 cm hyperdensity in the inferior right cerebellum could represent additional hemorrhage. Remote left PCA territory infarct. CTA head & neck No large vessel occlusion, hemodynamically significant stenosis, or aneurysm in the head or neck. MRI  Small acute hemorrhage in the right thalamus (1 mL) with regional edema but no significant mass effect. Superimposed small acute Right SCA cerebellar infarct without hemorrhagic transformation or mass effect. Extensive chronic hemorrhage in the brain, with pattern slightly favoring advanced small vessel disease rather than amyloid angiopathy. 2D Echo EF 65-70%  LDL 65 HgbA1c 5.3 VTE prophylaxis - lovenox  Eliquis  (apixaban ) daily prior to admission, now on No antithrombotic due to ICH. Recommend repeating CT head in 2-3 weeks to determine if can restart Eliquis   Therapy recommendations:  SNF Disposition: SNF  Hx of Stroke/TIA 03/2021 Left PCA infarct with residual right hemianopia. 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 Recommend repeating CT head in 2-3 weeks to determine if can restart Eliquis     Hypertension Hypotension after IV hydralazine  Home meds: Lasix , metoprolol  Stable on the high end dc metoprolol  due to bradycardia BP goal < 160 Cleviprex off Continue amlodipine  5 Long term goal normotensive   Hyperlipidemia Home meds: Atorvastatin  40 mg, resumed in hospital LDL 65, goal < 70 Continue statin at discharge  Urine retention  Foley placed 10/27, continue on discharge  Continue urecholine  Failed voiding trial 10/28 Recommend voiding trials at SNF   Other Active Problems Recent cataract surgery on left eye, Steroid and cosopt eye drops resumed R eye blind RA RLS Vascular dementia,  follow[facility] Sharri at [facility] Constipation, on bowel regimen  DISCHARGE EXAM Blood pressure 137/61, pulse (!) 56, temperature 97.8 F (36.6 C), temperature source Oral, resp. rate 18, height 5' 2.5 (1.588 m), weight 56.4 kg, SpO2 99%.  PHYSICAL EXAM General:  Alert, well-nourished, well-developed patient in no acute distress CV: Regular rate and rhythm on monitor Respiratory:  Regular, unlabored respirations on room air GI: Abdomen soft and nontender   NEURO:  Patient awake, alert, eyes open, orientated to age, place, time.  No aphasia but paucity of speech, following all simple commands, mild dysarthria. Able to name and repeat in dysarthric voice.   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  Mild left facial droop. Intermittent and improving left face numbness and heaviness. Tongue midline.   Left arm slight drift with mild ataxia, left lower extremity 4/5 also with mild ataxia, left lower extremity and face decreased light touch sensation. Right upper and lower extremity 4/5, no ataxia., gait not tested.   Discharge NIH: 6  Discharge Diet       Diet   Diet regular Room service appropriate? Yes with Assist; Fluid consistency: Thin   liquids  DISCHARGE PLAN Disposition:  SNF No antithrombotic due to ICH. Recommend repeating CT head in 2-3 weeks to determine if can restart Eliquis  (ordered) Ongoing stroke risk  factor control by Primary Care Physician at time of discharge Follow-up PCP Feliciano Devoria LABOR, MD in 2 weeks. Follow-up in [facility] in 8 weeks, office to schedule an appointment.   35 minutes were spent preparing discharge.  Pt seen by Neuro NP/APP and later by MD. Note/plan to be edited by MD as needed.    Rocky JAYSON Likes, DNP, AGACNP-BC [facility] Please use AMION for contact information & EPIC for messaging.

## 2024-01-10 NOTE — Progress Notes (Signed)
 Patient going to [facility], room 115. Report given.

## 2024-01-11 ENCOUNTER — Other Ambulatory Visit: Payer: Self-pay | Admitting: Neurology

## 2024-01-19 ENCOUNTER — Telehealth (HOSPITAL_BASED_OUTPATIENT_CLINIC_OR_DEPARTMENT_OTHER): Payer: Self-pay

## 2024-01-25 ENCOUNTER — Encounter (HOSPITAL_COMMUNITY): Payer: Self-pay | Admitting: Internal Medicine

## 2024-01-25 ENCOUNTER — Other Ambulatory Visit (HOSPITAL_BASED_OUTPATIENT_CLINIC_OR_DEPARTMENT_OTHER)

## 2024-01-25 ENCOUNTER — Ambulatory Visit (HOSPITAL_COMMUNITY)
Admission: RE | Admit: 2024-01-25 | Discharge: 2024-01-25 | Disposition: A | Discharge status: Home / Self Care | Source: Ambulatory Visit | Attending: Internal Medicine | Admitting: Internal Medicine

## 2024-01-25 ENCOUNTER — Ambulatory Visit (HOSPITAL_BASED_OUTPATIENT_CLINIC_OR_DEPARTMENT_OTHER)
Admission: RE | Admit: 2024-01-25 | Discharge: 2024-01-25 | Disposition: A | Discharge status: Home / Self Care | Source: Ambulatory Visit | Attending: Neurology | Admitting: Neurology

## 2024-01-25 VITALS — BP 118/62 | HR 50 | Ht 62.5 in | Wt 125.0 lb

## 2024-01-25 DIAGNOSIS — I4892 Unspecified atrial flutter: Secondary | ICD-10-CM | POA: Diagnosis not present

## 2024-01-25 DIAGNOSIS — R609 Edema, unspecified: Secondary | ICD-10-CM | POA: Diagnosis not present

## 2024-01-25 DIAGNOSIS — R001 Bradycardia, unspecified: Secondary | ICD-10-CM | POA: Insufficient documentation

## 2024-01-25 DIAGNOSIS — I454 Nonspecific intraventricular block: Secondary | ICD-10-CM | POA: Diagnosis not present

## 2024-01-25 DIAGNOSIS — I5030 Unspecified diastolic (congestive) heart failure: Secondary | ICD-10-CM | POA: Insufficient documentation

## 2024-01-25 DIAGNOSIS — E039 Hypothyroidism, unspecified: Secondary | ICD-10-CM | POA: Insufficient documentation

## 2024-01-25 DIAGNOSIS — I2510 Atherosclerotic heart disease of native coronary artery without angina pectoris: Secondary | ICD-10-CM | POA: Diagnosis not present

## 2024-01-25 DIAGNOSIS — Z79899 Other long term (current) drug therapy: Secondary | ICD-10-CM | POA: Diagnosis not present

## 2024-01-25 DIAGNOSIS — I618 Other nontraumatic intracerebral hemorrhage: Secondary | ICD-10-CM | POA: Diagnosis not present

## 2024-01-25 DIAGNOSIS — I110 Hypertensive heart disease with heart failure: Secondary | ICD-10-CM | POA: Insufficient documentation

## 2024-01-25 DIAGNOSIS — Z8673 Personal history of transient ischemic attack (TIA), and cerebral infarction without residual deficits: Secondary | ICD-10-CM | POA: Insufficient documentation

## 2024-01-25 DIAGNOSIS — D6869 Other thrombophilia: Secondary | ICD-10-CM | POA: Diagnosis not present

## 2024-01-25 DIAGNOSIS — R9431 Abnormal electrocardiogram [ECG] [EKG]: Secondary | ICD-10-CM | POA: Insufficient documentation

## 2024-01-25 DIAGNOSIS — I4819 Other persistent atrial fibrillation: Secondary | ICD-10-CM | POA: Insufficient documentation

## 2024-01-25 DIAGNOSIS — Z5181 Encounter for therapeutic drug level monitoring: Secondary | ICD-10-CM | POA: Diagnosis not present

## 2024-01-25 DIAGNOSIS — I700 Atherosclerosis of aorta: Secondary | ICD-10-CM | POA: Insufficient documentation

## 2024-01-25 DIAGNOSIS — I619 Nontraumatic intracerebral hemorrhage, unspecified: Secondary | ICD-10-CM

## 2024-01-25 NOTE — Progress Notes (Signed)
 Primary Care Physician: Feliciano Devoria LABOR, MD Primary Cardiologist: Kardie Tobb, DO Electrophysiologist: None     Referring Physician: Josefa Beauvais     Sara Snow is a 83 y.o. female with a history of coronary and aortic artery calcifications by imaging, HTN, CVA, hypothyroidism, HFpEF, anemia, and persistent atrial fibrillation who presents for consultation in the [facility].  Seen by Jackee Alberts, NP, on 8/7 and noted to be in Afib. She underwent successful DCCV on 8/14. Seen by Josefa Beauvais, NP, on 9/4, and found to be in atrial flutter. Currently taking Toprol  50 mg daily. Patient is on Eliquis  2.5 mg BID for a CHADS2VASC score of 8.  On evaluation today, she is currently in Afib. She had ERAF after successful cardioversion on 8/14. No missed doses of Eliquis . She feels very tired when in Afib and SOB with activity.   On follow up 01/03/23, she is currently in Afib. After last OV, she called back 9/23 and determine she would like to try amiodarone  for rhythm control. She is currently taking amiodarone  200 mg BID. No missed doses of Eliquis  2.5 mg BID. Husband notes from paper showing daily weights that she is up about 5 lbs in one week. She notes some more SOB recently than in the past few weeks.   On follow up 01/26/23, she is currently in NSR. She is s/p successful DCCV on 01/19/23. She is currently taking amiodarone  200 mg daily. She was placed on increased lasix  40 mg BID and additional potassium 11/8-11 for increased weight post cardioversion. She transitioned to lasix  40 mg daily since temporary increase. She is doing well overall. Husband has provided me a detailed log of her weights and discontinuation of metoprolol  for HR < 60 bpm. She feels much better since increased lasix . She has lost ~15 lbs since post cardioversion.   On follow up 07/27/23, she is here for amiodarone  surveillance. She is currently in NSR. She has had no episodes of Afib  since last office visit. She is taking amiodarone  200 mg daily. No bleeding issues on Eliquis  2.5 mg BID.   Follow-up 01/25/2024, patient is here for amiodarone  surveillance.  She is currently inNSR.  She takes amiodarone  200 mg daily. She has had overall low Afib burden since last office visit. Recent hospital admission 10/24 - 10/29 for ICH.  Decision made to hold Eliquis  due to new ICH.  Plan for repeating head CT in several weeks to determine if can restart anticoagulant. She is scheduled today for head CT. She is currently in rehab.  Today, she denies symptoms of palpitations, chest pain, orthopnea, PND, lower extremity edema, dizziness, presyncope, syncope, snoring, daytime somnolence, bleeding, or neurologic sequela. The patient is tolerating medications without difficulties and is otherwise without complaint today.   she has a BMI of Body mass index is 22.5 kg/m.[name] Filed Weights   01/25/24 1355  Weight: 56.7 kg     Current Outpatient Medications  Medication Sig Dispense Refill   acetaminophen  (TYLENOL ) 650 MG CR tablet Take 1,300 mg by mouth in the morning, at noon, and at bedtime.     amiodarone  (PACERONE ) 200 MG tablet Take 1 tablet (200 mg total) by mouth 2 (two) times daily for 30 days, THEN 1 tablet (200 mg total) daily. 90 tablet 3   amLODipine (NORVASC) 5 MG tablet Take 1 tablet (5 mg total) by mouth daily.     atorvastatin  (LIPITOR) 40 MG tablet Take 40 mg by mouth daily with  supper.     bethanechol (URECHOLINE) 10 MG tablet Take 1 tablet (10 mg total) by mouth 3 (three) times daily.     Calcium  Citrate-Vitamin D 315-5 MG-MCG TABS Take 1 tablet by mouth daily.     Carboxymethylcellulose Sod PF (REFRESH PLUS) 0.5 % SOLN Place 1 drop into both eyes 6 (six) times daily.     dorzolamide-timolol (COSOPT) 2-0.5 % ophthalmic solution Place 1 drop into both eyes 2 (two) times daily.     enoxaparin  (LOVENOX ) 40 MG/0.4ML injection Inject 0.4 mLs (40 mg total) into the skin daily.      folic acid  (FOLVITE ) 1 MG tablet Take 1 mg by mouth daily.     furosemide  (LASIX ) 20 MG tablet Take 1 tablet (20 mg total) by mouth daily. May take extra tablet daily for wt gain/swelling 60 tablet 6   gabapentin  (NEURONTIN ) 100 MG capsule Take 1 capsule (100 mg total) by mouth daily as needed (RLS).     gabapentin  (NEURONTIN ) 100 MG capsule Take 2 capsules (200 mg total) by mouth at bedtime.     KLOR-CON  20 MEQ packet Take 20 mEq by mouth daily.     levothyroxine  (SYNTHROID ) 75 MCG tablet Take 75 mcg by mouth daily before breakfast.     metoprolol  succinate (TOPROL -XL) 25 MG 24 hr tablet Take 0.5 tablets (12.5 mg total) by mouth daily. 45 tablet 1   metoprolol  tartrate (LOPRESSOR ) 25 MG tablet Take 1 tablet (25 mg total) by mouth daily as needed (HEART RATE OVER 120). 30 tablet 1   Multiple Vitamins-Minerals (PRESERVISION AREDS) CAPS Take 1 capsule by mouth in the morning and at bedtime.     pantoprazole  (PROTONIX ) 40 MG tablet Take 1 tablet (40 mg total) by mouth daily. (Patient taking differently: Take 40 mg by mouth every other day.) 30 tablet 1   prednisoLONE acetate (PRED FORTE) 1 % ophthalmic suspension Administer 1 drop into left eye 4 (four) times a day. Use 4x daily left eye, DO NOT START UNTIL AFTER SURGERY     senna-docusate (SENOKOT-S) 8.6-50 MG tablet Take 1 tablet by mouth 2 (two) times daily.     Vitamin D, Ergocalciferol, (DRISDOL) 1.25 MG (50000 UNIT) CAPS capsule Take 50,000 Units by mouth every 7 (seven) days. Sunday     No current facility-administered medications for this encounter.    Atrial Fibrillation Management history:  Previous antiarrhythmic drugs: amiodarone  Previous cardioversions: 10/26/22, 01/19/23 Previous ablations: None Anticoagulation history: Eliquis  2.5 mg BID   ROS- All systems are reviewed and negative except as per the HPI above.  Physical Exam: BP 118/62   Pulse (!) 50   Ht 5' 2.5 (1.588 m)   Wt 56.7 kg   BMI 22.50 kg/m   GEN- The patient  is well appearing, alert and oriented x 3 today.   Neck - no JVD or carotid bruit noted Lungs- Clear to ausculation bilaterally, normal work of breathing Heart- Regular rate and rhythm, no murmurs, rubs or gallops, PMI not laterally displaced Extremities- no clubbing, cyanosis, or edema Skin - no rash or ecchymosis noted   EKG today demonstrates  Vent. rate 50 BPM PR interval 194 ms QRS duration 134 ms QT/QTcB 504/459 ms P-R-T axes 52 -47 5 Sinus bradycardia Left axis deviation Non-specific intra-ventricular conduction block Cannot rule out Septal infarct , age undetermined Abnormal ECG When compared with ECG of 05-Jan-2024 21:10, PREVIOUS ECG IS PRESENT  Echo 01/06/24:  1. Concerns for asymmetric hypertrophy of the apical segments but images  could  be foreshoretened. Would recommend repeat limited study with  contrast or cardiac MRI for clarification. Left ventricular ejection  fraction, by estimation, is 65 to 70%. The  left ventricle has normal function. The left ventricle has no regional  wall motion abnormalities. There is moderate concentric left ventricular  hypertrophy. Left ventricular diastolic parameters are consistent with  Grade I diastolic dysfunction (impaired   relaxation).   2. Right ventricular systolic function is normal. The right ventricular  size is normal. Tricuspid regurgitation signal is inadequate for assessing  PA pressure.   3. The mitral valve is grossly normal. Trivial mitral valve  regurgitation. No evidence of mitral stenosis.   4. The aortic valve is tricuspid. Aortic valve regurgitation is trivial.  No aortic stenosis is present.   5. The inferior vena cava is normal in size with greater than 50%  respiratory variability, suggesting right atrial pressure of 3 mmHg.   Comparison(s): Changes from prior study are noted. Concerns for asymmetric  apical hypertrophy.   CHA2DS2-VASc Score = 8  The patient's score is based upon: CHF History:  1 HTN History: 1 Diabetes History: 0 Stroke History: 2 Vascular Disease History: 1 Age Score: 2 Gender Score: 1       ASSESSMENT AND PLAN: Persistent Atrial Fibrillation (ICD10:  I48.19) / atrial flutter The patient's CHA2DS2-VASc score is 8, indicating a 10.8% annual risk of stroke.   S/p successful DCCV on 10/26/22 with ERAF.  S/p successful DCCV on 01/19/23.   Patient is currently in NSR. Continue current management without change.   High risk medication monitoring (ICD10: J342684) Patient requires ongoing monitoring for anti-arrhythmic medication which has the potential to cause life threatening arrhythmias or AV block. Qtc stable. Continue amiodarone  200 mg daily. TSH drawn today. Cmet from 10/24 stable.   Secondary Hypercoagulable State (ICD10:  D68.69) The patient is at significant risk for stroke/thromboembolism based upon her CHA2DS2-VASc Score of 8.   Eliquis  2.5 mg BID is currently on hold due to ICH in October. Patient has repeat head CT today and determination to be made by Neurology as to whether can resume OAC.   Follow up 6 months Afib clinic for amiodarone  surveillance.    Terra Pac, PA-C  Afib [facility] [address] [place], [place] [address] [phone]

## 2024-01-25 NOTE — Patient Instructions (Signed)
 Continue to hold Eliquis  until further instructed

## 2024-01-26 ENCOUNTER — Ambulatory Visit (HOSPITAL_COMMUNITY): Payer: Self-pay | Admitting: Internal Medicine

## 2024-01-26 LAB — TSH: TSH: 43.7 u[IU]/mL — ABNORMAL HIGH (ref 0.450–4.500)

## 2024-01-31 ENCOUNTER — Telehealth (HOSPITAL_COMMUNITY): Payer: Self-pay | Admitting: *Deleted

## 2024-01-31 NOTE — Telephone Encounter (Addendum)
-----   Message ----- From: [name], PA-C Sent: 01/30/2024   9:52 AM EST To: Snow GORMAN Pepper, RN Subject: Follow up regarding restart of Eliquis          Sara Snow,  Can you please let patient know below from discussion with Dr. Jerri (neurologist in hospital) are below?  I looked at her repeat CT 4 days ago. The R thalamic ICH has resolved but the right cerebellar ICH still not fully resolved yet. will hold off eliquis  for now. I planned to make an appointment with her the first week of Dec to discuss about resume Eliquis  or consider ASPIRE trial if she is interested.    Thank you,   Jon    Patient and husband notified of recommendations to continue holding Eliquis  for now and to contact Dr Jerri office regarding appointment first week of December.  Pt husband stated she is established with neurology in [place] and will contact her neurologist for the appt. Patient and husband verbalized understanding of recommendations.

## 2024-02-01 NOTE — Progress Notes (Signed)
 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 Followed by ophthalmology and undergoing cataract surgery soon. Modifiable stroke risk factors include HTN, dyslipidemia, intracranial stenoses.  Encouraged continued efforts at optimization (BP <140/90, LDL <70 mg/dL) with PCP. Instructed to seek urgent medical attention with any new focal neurological changes which were reviewed with the patient and her husband.   2) Memory impairment.  Possible component of vascular dementia.  Recent MoCA 07/28/2022 with score of 25/30.  Previous MoCA 21/30 on 04/06/2021. We reviewed diagnostic considerations, treatment concepts and options.  Encouraged continued efforts at salubrious cognitive and physical activities.  Recommended pursuing neuropsych testing due to visual impairment when ready.  All posed questions answered.  3) Intracranial hemorrhage (ICH).  Right thalamus. October 2025. HAS-BLED score 4. We reviewed pathophysiology, diagnostic considerations, treatment concepts and limitations.  The patient has thus far not declared any progression of focal neurological symptoms since the time of the bleed, and has shown signs of radiographic improvement such that it would be appropriate to start the patient back up on anticoagulation at this time (consensus data supports resumption of antithrombotics 1-2 weeks after stable ICH).  Have recommended that the patient reach out to her cardiology provider at the atrial fibrillation clinic, for appropriate transitioning from Lovenox  to the Eliquis , and possible discussion of Watchman or other occlusive device.  Recommended to contact us  with any  new progression of focal neurological symptoms or headache which were reviewed with the patient and husband in detail today.  Encouraged continued efforts at optimizing blood pressure and other stroke risk factors as outlined above. All posed questions answered.  Next: Neuropsych testing  Plan  1) Resume Eliquis  2.5 mg once daily with consideration to twice daily dosing at discretion of cardiology.  Recommended clarification of discontinuation instructions for Lovenox  with cardiology.   I plan to see Sara Snow back for Return in about 4 months (around 06/01/2024).  She understands to call me for any questions or concerns.   ___________ L. Charlyne, MD Certifications in Neurology, Clinical Neurophysiology, Neuroimaging  HPI  Sara Snow is a 83 y.o. WF with hx CVA, memory impairment.  She is accompanied by her husband today.  Patient worked in today for reevaluation after recent hospitalization for intracranial hemorrhage.  Patient was hospitalized from 10/24-29/25.  Developed small right cerebellar SCA territory infarct and intracranial hemorrhage right thalamus and right cerebellum.  Concern was for brief hiatus off Eliquis  for left cataract surgery possibly provoking ischemic stroke as well as elevated blood pressure and anticoagulation promoting ICH.  Patient presented to the ED with complaints of weakness and dropping items from her hand.  Patient started to notice dropping her fork from her left hand around dinnertime at around 5:30 PM.  Noted that her left leg felt different.  Reported mild headache.  Patient noted to have left facial droop, arm drift and leg weakness on initial evaluation in the ED as well as left extremity numbness.  Systolic blood pressure noted to be around 200.  Patient was discharged off Eliquis  but maintained on Lovenox .  Did undergo rehab.  Initial CT head completed 01/01/2024.  Repeat completed 01/06/2024.    Most recent CT head  01/25/2024  with report of decreased right thalamic hemorrhage size.  No new hemorrhage.  The patient has been maintained on bridging Lovenox  and took her last Lovenox  injection yesterday and has not had any injections today Does have an available supply of Eliquis  2.5 mg with previous regimen of 2.5 mg twice daily noted  Last visit patient underwent MoCA and scored 25/30.  No new focal neurological changes No new language or speech changes No new diplopia or amaurosis fugax No new strength or dexterity changes No new numbness or tingling No new balance changes or fall  Labs reviewed - TSH 07/27/2023: Elevated at 13.5.   Hemoglobin A1c : N/A.  Lipid profile 04/18/2021: Total cholesterol 222, LDL 105 mg/dL. Hypercoagulable profile: N/A.  Last MRI brain: 01/06/2024.  Right thalamic small acute hemorrhage with associated edema.  Small acute right SCA cerebellar infarct.  Extensive chronic microvascular ischemic changes.  Previous MRI brain 01/15/2022.  No acute infarct.  Left PCA infarct.  Chronic small vessel ischemic changes.  Numerous microhemorrhages suggestive for HTN.  Left cerebellar hemisphere signal change new since last study (04/17/2021), rule out mets. Images independently reviewed and interpreted  Last intracranial vascular imaging: CTA head and neck 01/05/2024 no LVO.  No aneurysm..  Severe right P2 stenosis.  Moderate right M2 stenosis.  Mild left M2, right P2 stenosis.  Last carotid Dopplers: N/A.  Last cardiac rhythm monitoring: Zio patch 4/12 through 06/25/2021.  Occasional PACs.  Atrial tachycardia.  Last cardiac echo: 01/06/2024.  EF 65-70%.  Apical hypertrophy.  Moderate LVH.  Initial Hx:  Patient was previously evaluated by [facility] scales on 04/06/2021 for evaluation of memory difficulty.  Reported having increased difficulty with memory after contracting COVID in December 2022.  Patient reported visual deficit and reports having difficulty recognizing buttons on appliances in  her home.  Denied any difficulty with medication administration.  Patient scored 21/30 on MoCA.  Patient was subsequently sent for additional screening labs and MRI of the brain.  As a result of this imaging, subacute infarct of left PCA was detected and patient was instructed to seek urgent medical attention.  Reviewed last hospital neurology note from 04/18/2021 with Dr. Overton.  Patient with history of hypertension, RLS, left eye macular degeneration, right optic disc hemorrhage presented for abnormal MRI brain.  Patient reports symptoms began several days before Christmas describing difficulty with moving and feeling weak lasting for about 30 minutes.  Several days later diagnosed with COVID and has subsequent brain fog.  Had difficulty reading letters and numbers and seeing only partial images.  Having difficulty with conversation pieces.  Some symptom improvement but would still difficulty with vision.  Last MoCA score 21/30.  We will MRI brain completed showing subacute left PCA infarct with subsequent CTA indicating severe left P2 narrowing, hypoplastic right P1.  Patient was not deemed a thrombolytic candidate due to timeframe.  Examination notable for right visual field cut.  Patient begun on aspirin 81 mg daily and Lipitor 40 mg daily.  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 Hemoglobin A1c : 04/17/2021 5.7%. Lipid profile : 04/18/2021 total cholesterol 222 mg/dL, LDL 894 mg/dL. Hypercoagulable profile : N/A.  Last MRI brain: 04/17/2021. Images independently reviewed and interpreted.  Left PCA infarct along left temporal and occipital lobes.  Chronic microvascular ischemic changes.  Left cerebellar small chronic infarct.  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  Mild diffuse cortical atrophy.  Last intracranial vascular imaging: CTA head and neck 04/17/2021.  Stenosis at right M2, left M2, right P2, left P2 with associated left occipital and posterior temporal lobe PCA territory infarct.  Last carotid Dopplers: N/A.  Last cardiac rhythm monitoring: 02/15/2019 Zio patch with SVT.  Last cardiac echo: 04/19/2021.  Ejection fraction 60-65%.  Exam  BP 130/80 (BP Location: Left arm, Patient Position: Sitting)   Pulse (!) 48   SpO2 98%   Elder white female in NAD. Afebrile. Cooperative with examiner. [place]/AT. Anicteric. Lids normal. No audible wheeze. No accessory respiratory muscle use. No rash in arms. No ecchymoses of arms/hands.   Language intact. Affect full. Mood euthymic. VF with hand movements, light perception in the right eye; foveal vision preservation for movement in the left eye with some preservation of motion perception in 4 quadrants.  EOMI. PER. Cold sense symmetric at V1, diminished left V2, V3. Face symmetric. Phonation intact. Soft palate elevation symmetric.  Symmetric shoulder shrug.  Tongue symmetric without atrophy.  No tremor seen.  Slowed finger tapping on the left. Strength symmetric and full in upper and lower extremities except for mild weakness of the left: Grip 4+, wrist extensors 5 bilaterally. DTRs symmetric at UEs and LEs. RAM intact. FNF intact.  Sensory exam symmetrically intact to UE proprioception; cold attenuated at left upper extremity relative to  the right, symmetric at legs.  Vibratory intact in all 4 distal extremities.  PM/SH   Allergies  Allergen Reactions  . Celecoxib Rash  . Metoclopramide Other (See Comments)    Nightmares.  . Amlodipine -Valsartan-Hcthiazid Dizziness  . Hydroxychloroquine Other (See Comments)    Retinal toxicity  . Hydrochlorothiazide Rash   Current Outpatient Medications  Medication Sig Dispense Refill  . dorzolamide -timoloL  (COSOPT ) 22.3-6.8 mg/mL ophthalmic solution Administer 1 drop into both eyes 2 (two) times a day. 10 mL 11  . furosemide  (LASIX ) 20 mg tablet     . gabapentin  (NEURONTIN ) 100 mg capsule 1-3 tabs at bedtime, for restless legs, per PCP (Patient taking differently: 1 cap during day & 2caps  at HS) 270 capsule 3  . potassium chloride  (KLOR-CON ) 10 mEq ER tablet     . acetaminophen  (TYLENOL ) 650 mg ER tablet Take 650 mg by mouth every 8 (eight) hours as needed (pain).    . amLODIPine  (NORVASC ) 5 mg tablet Take 5 mg by mouth daily.    [name] ammonium lactate (LAC-HYDRIN) 12 % lotion Apply topically 2 (two) times a day. To arms and legs 225 g 11  . apixaban  (ELIQUIS ) 2.5 mg tab Take 2.5 mg by mouth.    . atorvastatin  (LIPITOR) 40 mg tablet Take 40 mg by mouth Once Daily. 90 tablet 3  . bethanechol  (URECHOLINE ) 10 mg tablet Take 10 mg by mouth 3 (three) times a day.    . calcium  citrate-vitamin D3 315 mg-5 mcg (200 unit) tab Take 1 tablet by mouth daily.    . carboxymethylcellulose (Refresh Plus) 0.5 % dpet  1 drop 6 (six) times a day.    . enoxaparin  (LOVENOX ) 40 mg/0.4 mL syrg Inject 40 mg under the skin daily. 40 mg total    . ergocalciferol (VITAMIN D2) 1,250 mcg (50,000 unit) capsule Take 50,000 Units by mouth once a week. 12 capsule 3  . erythromycin (ROMYCIN) 5 mg/gram (0.5 %) ophthalmic ointment Apply 0.5 inches to left eye in the morning and 0.5 inches before bedtime. 3.5 g 11  . folic acid  (FOLVITE ) 1 mg tablet Take 1 mg by mouth Once Daily. 90 tablet 3  . Lactobacillus acidophilus  (Probiotic) 10 billion cell cap Take 1 tablet by mouth daily.    . levothyroxine  (SYNTHROID ) 75 mcg tablet Take 1 tablet (75 mcg total) by mouth daily. 90 tablet 0  . metoprolol  succinate (TOPROL  XL) 25 mg 24 hr tablet Take 1 tablet (25 mg total) by mouth daily. 90 tablet 3  . metoprolol  tartrate (LOPRESSOR ) 25 mg tablet Take 25 mg by mouth.    . multivitamin (THERAGRAN) tab tablet Take 2 tablets by mouth daily.    [name] omega 3-dha-epa-fish oil (OMEGA 3) 1,000 mg capsule Take 1 g by mouth daily.    . pantoprazole  (PROTONIX ) 40 mg EC tablet Take 40 mg by mouth daily. 90 tablet 3  . prednisoLONE  acetate (PRED FORTE ) 1 % ophthalmic suspension Administer 1 drop into left eye 4 (four) times a day. Use 4x daily left eye, DO NOT START UNTIL AFTER SURGERY 15 mL 0  . sennosides-docusate sodium  (PERICOLACE) 8.6-50 mg per tablet Take 1 tablet by mouth 2 (two) times a day.    . vitamins A,C,E-zinc-copper (PreserVision AREDS) 4,296 mcg-226 mg-90 mg cap Take 1 tablet by mouth 2 (two) times a day.     No current facility-administered medications for this visit.   Past Medical History:  Diagnosis Date  . Advanced nonexudative age-related macular degeneration of left eye with subfoveal involvement 06/16/2020  . Atrial fibrillation    (CMD)   . Cataract   . Fibroid   . Goiter   . History of transfusion   . Hypertension   . Hypothyroidism   . Long term current use of anticoagulant   . Melena 10/05/2017   Added automatically from request for surgery [id number]  . MVP (mitral valve prolapse)   . Neurocardiogenic syncope 02/20/2015  . Osteopenia   . Rheumatoid arthritis    (CMD)    Onset at age 54  . RLS (restless legs syndrome)   . SVT (supraventricular tachycardia) (CMD) 04/21/2019  . 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 Unrefreshed by sleep 12/06/2019   Past Surgical History:  Procedure Laterality Date  . ABDOMINAL SURGERY      Procedure: ABDOMINAL SURGERY  . APPENDECTOMY      Procedure: APPENDECTOMY  . BREAST SURGERY     Procedure: BREAST SURGERY  . CATARACT EXTRACTION W/ INTRAOCULAR LENS IMPLANT Left 12/25/2023   EXTRACTION CATARACT WITH INTRAOCULAR LENS INSERTION performed by [place] Beverlee GAILS, MD at [facility] OR  . COLONOSCOPY     Procedure: COLONOSCOPY  . COLPOSCOPY     Procedure: COLPOSCOPY  . EPIDURAL BLOCK INJECTION Bilateral 07/25/2023   BLOCK/INJECTION MEDIAL BRANCH LUMBAR FACET Bilat L4-S1 #1/2 performed by Darwyn Blanch, MD at [facility] ASC OR  . EPIDURAL BLOCK INJECTION Bilateral 08/10/2023   BLOCK/INJECTION MEDIAL BRANCH LUMBAR FACET  BILATERAL L4-S1 #2/2 performed by Darwyn Blanch, MD at [facility] ASC OR  . ESOPHAGOGASTRODUODENOSCOPY N/A  10/26/2017   Procedure: EGD;  Surgeon: Linda Su Buerger, MD;  Location: [facility] ENDO;  Service: Gastroenterology;  Laterality: N/A;  (P) [phone], [phone]  CHECKED  . HYSTERECTOMY      Procedure: HYSTERECTOMY  . OOPHORECTOMY Bilateral    Procedure: OOPHORECTOMY  . OTHER SURGICAL HISTORY Left 20 YRS AGO   Procedure: OTHER SURGICAL HISTORY (CORNEA EROSION)  . REDUCTION MAMMAPLASTY Bilateral    Procedure: REDUCTION MAMMAPLASTY; 13 years ago  . RHIZOTOMY W/ RADIOFREQUENCY ABLATION Left 08/29/2023   RHIZOTOMY FACET RADIOFREQUENCY  L4-S1 LEFT  #1/2 performed by Darwyn Blanch, MD at [facility] OR  . RHIZOTOMY W/ RADIOFREQUENCY ABLATION Right 09/14/2023   RHIZOTOMY FACET RADIOFREQUENCY LUMBAR RFA L4-S1 RIGHT #2/2 performed by Darwyn Blanch, MD at [facility] ASC OR  . SACROILIAC JOINT INJECTION Bilateral 05/11/2023   INJECTION/ASPIRATION JOINT SACROILLIAC Bilat SI joints performed by Janus Blanch, MD at [facility] ASC OR   Family History  Problem Relation Name Age of Onset  . Osteoarthritis Mother    . Diabetes Mother    . Heart failure Mother    . Thyroid disease Mother    . Cancer Father    . Stomach cancer Father    . Diabetes Sister    . Osteoarthritis Sister    . Stroke Sister    . Macular  degeneration Sister    . Fainting Sister    . Colon cancer Brother    . Graves' disease Daughter    . Dementia Maternal Aunt    . Diabetes Maternal Aunt    . Stroke Maternal Uncle    . Diabetes Paternal Uncle    . Heart failure Maternal Grandmother         Died when in sixth grade  . Dementia Paternal Grandmother    . Glaucoma Neg Hx    . Ankylosing spondylitis Neg Hx    . Psoriasis Neg Hx    . Rheum arthritis Neg Hx    . Lupus Neg Hx    . Gout Neg Hx      LAB DATA   Lab Results  Component Value Date   WBC 6.1 03/18/2022   HGB 12.7 03/18/2022   HCT 37.7 03/18/2022   PLT 161 03/18/2022   CHOL 222 (H) 04/18/2021   TRIG 92 04/18/2021   HDL 99 04/18/2021   LDLDIRECT 90 04/08/2021   ALT 31 06/28/2022   AST 28 06/28/2022   NA 132 (L) 06/28/2022   K 4.7 06/28/2022   CL 97 (L) 06/28/2022   CREATININE 0.90 06/28/2022   BUN 23 06/28/2022   CO2 26 06/28/2022   INR 0.98 04/17/2021   HGBA1C 5.7 (H) 04/17/2021   HGBA1C 5.7 (H) 04/17/2021   Lab on 06/28/2022  Component Date Value Ref Range Status  . Sodium 06/28/2022 132 (L)  136 - 145 mmol/L Final  . Potassium 06/28/2022 4.7  3.5 - 5.1 mmol/L Final   NO VISIBLE HEMOLYSIS  . Chloride 06/28/2022 97 (L)  98 - 107 mmol/L Final  . CO2 06/28/2022 26  21 - 31 mmol/L Final  . Anion Gap 06/28/2022 9  6 - 14 mmol/L Final  . Glucose, Random 06/28/2022 98  70 - 99 mg/dL Final  . Blood Urea Nitrogen (BUN) 06/28/2022 23  7 - 25 mg/dL Final  . Creatinine 95/83/7975 0.90  0.60 - 1.20 mg/dL Final  . eGFR 95/83/7975 65  >59 mL/min/1.36m2 Final   GFR estimated by CKD-EPI equations(NKF 2021).   Recommend confirmation of Cr-based  eGFR by using Cys-based eGFR and other filtration markers (if applicable) in complex cases and clinical decision-making, as needed.  . Albumin 06/28/2022 4.4  3.5 - 5.7 g/dL Final  . Total Protein 06/28/2022 7.2  6.4 - 8.9 g/dL Final  . Bilirubin, Total 06/28/2022 1.0  0.3 - 1.0 mg/dL Final  . Alkaline  Phosphatase (ALP) 06/28/2022 84  34 - 104 U/L Final  . Aspartate Aminotransferase (AST) 06/28/2022 28  13 - 39 U/L Final  . Alanine Aminotransferase (ALT) 06/28/2022 31  7 - 52 U/L Final  . Calcium  06/28/2022 9.1  8.6 - 10.3 mg/dL Final  . BUN/Creatinine Ratio 06/28/2022    Final   Creatinine is normal, ratio is not clinically indicated.   . Phosphorus 06/28/2022 4.4  2.5 - 5.0 mg/dL Final  . Vitamin D 74-Ybimnkb 06/28/2022 71.9  30.0 - 100.0 ng/mL Final   Deficient: <20 ng/mL  Insufficient: 20-30 ng/mL  Sufficient: 30-100 ng/mL  Upper Safety Limit/Toxicity: >100 ng/mL   . Parathyroid Hormone (PTH), Intact 06/28/2022 37  12 - 88 pg/mL Final  . Total Protein 06/28/2022 7.3  6.4 - 8.9 g/dL Final  . Electrophoresis, Albumin 06/28/2022 4.0  3.5 - 5.0 g/dL Final  . Joeyj-8-Honalopw 06/28/2022 0.4  0.1 - 0.4 g/dL Final  . Joeyj-7-Honalopw 06/28/2022 0.9  0.4 - 1.2 g/dL Final  . Azuj-8-Honalopw 06/28/2022 0.5  0.4 - 0.7 g/dL Final  . Azuj-7-Honalopw 06/28/2022 0.4  0.1 - 0.4 g/dL Final  . Gamma Globulin 06/28/2022 1.1  0.5 - 1.6 g/dL Final  . Protein Electrophoresis Interpreta* 06/28/2022 NO M SPIKE SEEN.   Final  . Pathology Review Serum Electrophor* 06/28/2022 Electronically signed by Ozell Darryle Sarah.    Final    IMAGING DATA   CLINICAL DATA:  Provided history: Memory deficit. Memory problems which began in December after having COVID.   EXAM: MRI HEAD WITHOUT CONTRAST   TECHNIQUE: Multiplanar, multiecho pulse sequences of the brain and surrounding structures were obtained without intravenous contrast.   COMPARISON:  Head CT 02/21/2019 (images available, report unavailable).   FINDINGS: Brain:   Mild generalized cerebral and cerebellar atrophy.   There is a moderate-sized focus of diffusion-weighted and T2 FLAIR hyperintense signal abnormality within the left PCA vascular territory within the cortical/subcortical left temporal and occipital lobes. There is  corresponding intermediate signal on the ADC map, and this is compatible with a subacute infarct.   Advanced patchy and confluent T2 FLAIR hyperintense signal abnormality within the cerebral white matter, nonspecific but compatible with chronic small vessel ischemic disease. Fairly advanced chronic small vessel ischemic changes are also present within the bilateral deep gray nuclei. Moderate chronic small vessel ischemic changes within the pons.   Small chronic infarct within the left cerebellar hemisphere.   Fairly numerous supratentorial and infratentorial chronic parenchymal microhemorrhages with a central, brainstem and cerebellar predominance. 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   There is no acute infarct.   No evidence of an intracranial mass.   No extra-axial fluid collection.   No midline shift.   Vascular: Maintained flow voids within the proximal large arterial vessels.   Skull and upper cervical spine: No focal suspicious marrow lesion.   Sinuses/Orbits: Visualized orbits show no acute finding. Mild mucosal thickening within the right frontal and bilateral ethmoid sinuses.   Impression #1 be called to the ordering clinician or representative by the Radiologist Assistant, and communication documented in the PACS or [employer].   IMPRESSION: Moderate-sized subacute left PCA territory infarct  within the cortical/subcortical left temporal and occipital lobes.   Advanced chronic small vessel ischemic disease, as described.   Small chronic infarct within the left cerebellar hemisphere.   Fairly numerous supratentorial and infratentorial chronic parenchymal microhemorrhages with a central, brainstem and cerebellar predominance. 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   Mild generalized cerebral and cerebellar atrophy.     Electronically Signed   By: Rockey Childs D.O.   On: 04/17/2021  11:13 CLINICAL DATA:  Left PCA territory stroke found on MRI today, left temporal and occipital lobes   EXAM: CT ANGIOGRAPHY HEAD AND NECK   TECHNIQUE: Multidetector CT imaging of the head and neck was performed using the standard protocol during bolus administration of intravenous contrast. Multiplanar CT image reconstructions and MIPs were obtained to evaluate the vascular anatomy. Carotid stenosis measurements (when applicable) are obtained utilizing NASCET criteria, using the distal internal carotid diameter as the denominator.   RADIATION DOSE REDUCTION: This exam was performed according to the departmental dose-optimization program which includes automated exposure control, adjustment of the mA and/or kV according to patient size and/or use of iterative reconstruction technique.   CONTRAST:  80 mL Omnipaque  350   COMPARISON:  No prior CTA, correlation is made with CT head 02/21/2019 and MRI head 04/17/2021   FINDINGS: CT HEAD FINDINGS   Brain: New areas of hypodensity, compared to 02/21/2019 in the left occipital lobe and posterior temporal lobe (series 2, images 14-16, 20, and 22), which correlate with areas of restricted diffusion on the same-day CT. No acute hemorrhage, mass, mass effect, or midline shift. Confluent periventricular white matter changes, likely the sequela of severe chronic small vessel ischemic disease. No hydrocephalus or extra-axial collection. Degree of cerebral volume loss is within normal limits for age.   Vascular: Please see CTA findings below.   Skull: Normal. Negative for fracture or focal lesion.   Sinuses: No acute finding.   Orbits: No acute finding.   Review of the MIP images confirms the above findings   CTA NECK FINDINGS   Aortic arch: 4 vessel arch, with the origin of the left vertebral artery from the aorta. Imaged portion shows no evidence of aneurysm or dissection. No significant stenosis of the arch vessels.   Right  carotid system: No evidence of dissection, stenosis (50% or greater) or occlusion.   Left carotid system: No evidence of dissection, stenosis (50% or greater) or occlusion.   Vertebral arteries: Right dominant. No evidence of dissection, stenosis (50% or greater) or occlusion.   Skeleton: No acute osseous abnormality.   Other neck: Subcentimeter hypoenhancing nodules in the thyroid. No acute process in the neck.   Upper chest: Right-greater-than-left apical pleural-parenchymal scarring. No focal pulmonary opacity.   Review of the MIP images confirms the above findings   CTA HEAD FINDINGS   Anterior circulation:   Both internal carotid arteries are patent to the termini, with mild calcified and noncalcified plaque but without significant stenosis.   Patent left A1. Aplastic right A1. Normal anterior communicating artery. Anterior cerebral arteries are patent to their distal aspects.   No M1 stenosis or occlusion. Normal MCA bifurcations. Moderate narrowing in a proximal right M2 branch (series 10, image 84). Mild narrowing in the proximal left M2 branch (series 10, image 84). Distal MCA branches otherwise well perfused.   Posterior circulation: The left vertebral artery terminates in PICA. The right vertebral artery is dominant and patent to the vertebrobasilar junction. Posterior inferior cerebral arteries patent bilaterally.   Basilar  patent to its distal aspect. Superior cerebellar arteries patent bilaterally.   Patent left P1 segment. Severe focal narrowing for approximately 6 mm of the proximal left P2 segment (series 10, image 91). The left PCA is otherwise perfused. Aplastic right P1. Fetal origin of the right PCA, with patent right posterior communicating artery. Mild narrowing at the PCOM-P2 junction (series 10, image 86) and in a right P3 segment (series 10, image). The right PCA is otherwise perfused. The left posterior communicating artery is diminutive  but likely patent.   Venous sinuses: As permitted by contrast timing, patent.   Anatomic variants: Aplastic right A1, with the ACA territory supplied by the left A1. Aplastic right P1 with fetal origin of the right PCA.   Review of the MIP images confirms the above findings   IMPRESSION: 1. Hypodensity in the left occipital and posterior temporal lobe, consistent with the subacute left PCA territory infarct seen on the same-day MRI. 2. Severe focal narrowing of the proximal P2 segment, extending approximately 6 mm. 3. Additional moderate narrowing in the right M2 and mild narrowing in the left M2 and right P2 segments. 4. No hemodynamically significant stenosis in the neck. 5. Subcentimeter hypoenhancing nodules in the thyroid, for which no follow-up is recommended. (Reference: J Am Coll Radiol. 2015 Feb;12(2): 143-50)     Electronically Signed   By: Donald Campion M.D.   On: 04/17/2021 20:07 ____________________  MDM Professional time spent on direct face-to-face patient evaluation, care and coordination of care including indirect time spent on review of applicable labs, imaging, outside records, note composition on same day of visit: 40 minutes.    Orders No orders of the defined types were placed in this encounter.  No orders of the defined types were placed in this encounter.

## 2024-02-02 ENCOUNTER — Telehealth (HOSPITAL_COMMUNITY): Payer: Self-pay | Admitting: *Deleted

## 2024-02-02 MED ORDER — AMIODARONE HCL 200 MG PO TABS: 200.0000 mg | ORAL_TABLET | Freq: Every day | ORAL | 1 refills | Status: AC

## 2024-02-02 MED ORDER — APIXABAN 2.5 MG PO TABS: 2.5000 mg | ORAL_TABLET | Freq: Two times a day (BID) | ORAL | 1 refills | Status: AC

## 2024-02-02 NOTE — Telephone Encounter (Signed)
 Patient and husband notified of recommendations. Patient took 2.5mg  of Eliquis  this morning and NO lovenox .  Verbalized understanding of Eliquis  2.5mg  twice a day and no lovenox . Nat RN with rehab facility also notified although pt was discharged home yesterday evening.

## 2024-02-02 NOTE — Telephone Encounter (Signed)
-----   Message from [employer] sent at 02/02/2024  2:50 PM EST ----- Regarding: F/u on anticoagulation Hey,  I spoke with Dr. Tonuzi and he is comfortable with patient restarting Eliquis  due to stable smaller ICH.  If patient took her last Lovenox  injection today, then can restart Eliquis  2.5 mg twice daily tomorrow.  I confirmed the timing of when to take the Eliquis  in relation to the Lovenox  with pharmacy.  Thank you,  Thom

## 2024-02-13 ENCOUNTER — Telehealth: Payer: Self-pay | Admitting: Neurology

## 2024-02-13 NOTE — Telephone Encounter (Signed)
 I was contacted by AFib clinic about 2 weeks ago regarding pt anticoagulation post ICH. At that time, she was still in SNF and getting lovenox  subcutaneous for DVT prophylaxis. Repeat CT on 11/13 still showing right thalamic ICH, although decreased conspicuity but still largely not absorbed. I discussed with cardiology team and recommend to continue lovenox  for DVT prophylaxis and consider resume eliquis  or consider ASPIRE trial in the early Dec.   Apparently, she was seen by Dr. Charlyne at [facility] on 02/02/24 and recommended to start eliquis  2.5mg  daily with consideration to increase to 2.5mg  bid prior ICH dose.   I called pt today and her husband Ray picked up the call. He told me that pt was released from SNF on 11/20 and now she is at home on eliquis  2.5mg  bid now. She will continue to follow up with Dr. Tonuzi at [facility]. Husband expressed understanding and appreciation.   Ary Cummins, MD PhD Stroke Neurology 02/13/2024 12:23 PM

## 2024-03-01 ENCOUNTER — Ambulatory Visit: Attending: Cardiology | Admitting: Cardiology

## 2024-03-01 ENCOUNTER — Encounter: Payer: Self-pay | Admitting: Cardiology

## 2024-03-01 VITALS — BP 134/74 | HR 51 | Ht 62.0 in | Wt 126.9 lb

## 2024-03-01 DIAGNOSIS — Z8679 Personal history of other diseases of the circulatory system: Secondary | ICD-10-CM | POA: Insufficient documentation

## 2024-03-01 DIAGNOSIS — I480 Paroxysmal atrial fibrillation: Secondary | ICD-10-CM | POA: Insufficient documentation

## 2024-03-01 DIAGNOSIS — E782 Mixed hyperlipidemia: Secondary | ICD-10-CM | POA: Insufficient documentation

## 2024-03-01 DIAGNOSIS — I5032 Chronic diastolic (congestive) heart failure: Secondary | ICD-10-CM | POA: Insufficient documentation

## 2024-03-01 DIAGNOSIS — Z8673 Personal history of transient ischemic attack (TIA), and cerebral infarction without residual deficits: Secondary | ICD-10-CM | POA: Diagnosis not present

## 2024-03-01 NOTE — Patient Instructions (Signed)
 Medication Instructions:  Your physician recommends that you continue on your current medications as directed. Please refer to the Current Medication list given to you today.  *If you need a refill on your cardiac medications before your next appointment, please call your pharmacy*   Follow-Up: At [facility], you and your health needs are our priority.  As part of our continuing mission to provide you with exceptional heart care, our providers are all part of one team.  This team includes your primary Cardiologist (physician) and Advanced Practice Providers or APPs (Physician Assistants and Nurse Practitioners) who all work together to provide you with the care you need, when you need it.  Your next appointment:   6 month(s) - call in March for appointment   Provider:   Kardie Tobb, DO

## 2024-03-02 NOTE — Progress Notes (Signed)
 " Cardiology Office Note:    Date:  03/02/2024   ID:  Sara Snow, DOB January 28, 1941, MRN [id number]  PCP:  Feliciano Devoria LABOR, MD  Cardiologist:  Dub Huntsman, DO  Electrophysiologist:  None   Referring MD: Feliciano Devoria LABOR, MD    I am doing fine  History of Present Illness:    Sara Snow is a 83 y.o. female with a hx of atrial fibrillation rare ventricular rate she has had at least 2 cardioversions in the past, a chronic diastolic heart failure, hypertension, anemia, melena, hypothyroidism, CVA.  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 Notes small right cerebellar SCA in the setting of atrial fibrillation  She has followed the afib clinic since her hospitalization.   Past Medical History:  Diagnosis Date   GERD (gastroesophageal reflux disease)    Hypertension    RA (rheumatoid arthritis) (HCC)    Restless leg syndrome    Stroke [facility])    SVT (supraventricular tachycardia)    Thyroid disease     Past Surgical History:  Procedure Laterality Date   BIOPSY  09/21/2022   Procedure: BIOPSY;  Surgeon: Stacia Glendia BRAVO, MD;  Location: [facility] ENDOSCOPY;  Service: Gastroenterology;;   CARDIOVERSION N/A 10/26/2022   Procedure: CARDIOVERSION;  Surgeon: Huntsman Dub, DO;  Location: [facility] INVASIVE CV LAB;  Service: Cardiovascular;  Laterality: N/A;   CARDIOVERSION N/A 01/19/2023   Procedure: CARDIOVERSION;  Surgeon: Michele Richardson, DO;  Location: [facility] INVASIVE CV LAB;  Service: Cardiovascular;  Laterality: N/A;   ESOPHAGOGASTRODUODENOSCOPY (EGD) WITH PROPOFOL  N/A 09/21/2022   Procedure: ESOPHAGOGASTRODUODENOSCOPY (EGD) WITH PROPOFOL ;  Surgeon: Stacia Glendia BRAVO, MD;  Location: [facility] ENDOSCOPY;  Service: Gastroenterology;  Laterality: N/A;    Current Medications: Current Meds  Medication Sig   acetaminophen  (TYLENOL ) 650 MG CR tablet  Take 1,300 mg by mouth in the morning, at noon, and at bedtime.   amiodarone  (PACERONE ) 200 MG tablet Take 1 tablet (200 mg total) by mouth daily.   amLODipine  (NORVASC ) 5 MG tablet Take 1 tablet (5 mg total) by mouth daily.   apixaban  (ELIQUIS ) 2.5 MG TABS tablet Take 1 tablet (2.5 mg total) by mouth 2 (two) times daily.   atorvastatin  (LIPITOR) 40 MG tablet Take 40 mg by mouth daily with supper.   bethanechol  (URECHOLINE ) 10 MG tablet Take 1 tablet (10 mg total) by mouth 3 (three) times daily.   Calcium  Citrate-Vitamin D 315-5 MG-MCG TABS Take 1 tablet by mouth daily.   Carboxymethylcellulose Sod PF (REFRESH PLUS) 0.5 % SOLN Place 1 drop into both eyes 6 (six) times daily.   dorzolamide -timolol  (COSOPT ) 2-0.5 % ophthalmic solution Place 1 drop into both eyes 2 (two) times daily.   folic acid  (FOLVITE ) 1 MG tablet Take 1 mg by mouth daily.   furosemide  (LASIX ) 20 MG tablet Take 1 tablet (20 mg total) by mouth daily. May take extra tablet daily for wt gain/swelling   gabapentin  (NEURONTIN ) 100 MG capsule Take 1 capsule (100 mg total) by mouth daily as needed (RLS).   gabapentin  (NEURONTIN ) 100 MG capsule Take 2 capsules (200 mg total) by mouth at bedtime.   KLOR-CON  20 MEQ packet Take 20 mEq by mouth daily.   levothyroxine  (SYNTHROID ) 75 MCG tablet Take 75 mcg by mouth daily before breakfast.   metoprolol  succinate (TOPROL -XL) 25 MG 24 hr tablet Take 0.5 tablets (12.5 mg total) by mouth  daily.   metoprolol  tartrate (LOPRESSOR ) 25 MG tablet Take 1 tablet (25 mg total) by mouth daily as needed (HEART RATE OVER 120).   Multiple Vitamins-Minerals (PRESERVISION AREDS) CAPS Take 1 capsule by mouth in the morning and at bedtime.   pantoprazole  (PROTONIX ) 40 MG tablet Take 1 tablet (40 mg total) by mouth daily. (Patient taking differently: Take 40 mg by mouth every other day.)   prednisoLONE  acetate (PRED FORTE ) 1 % ophthalmic suspension Administer 1 drop into left eye 4 (four) times a day. Use 4x daily  left eye, DO NOT START UNTIL AFTER SURGERY   senna-docusate (SENOKOT-S) 8.6-50 MG tablet Take 1 tablet by mouth 2 (two) times daily.   Vitamin D, Ergocalciferol, (DRISDOL) 1.25 MG (50000 UNIT) CAPS capsule Take 50,000 Units by mouth every 7 (seven) days. Sunday     Allergies:   Plaquenil [hydroxychloroquine], Reglan [metoclopramide], Amlodipine -valsartan-hctz, and Celebrex [celecoxib]   Social History   Socioeconomic History   Marital status: Married    Spouse name: Not on file   Number of children: Not on file   Years of education: Not on file   Highest education level: Not on file  Occupational History   Not on file  Tobacco Use   Smoking status: Never    Passive exposure: Never   Smokeless tobacco: Never   Tobacco comments:    Never smoked 01/25/24  Vaping Use   Vaping status: Never Used  Substance and Sexual Activity   Alcohol  use: Yes    Comment: occ   Drug use: Not Currently   Sexual activity: Not on file  Other Topics Concern   Not on file  Social History Narrative   Not on file   Social Drivers of Health   Tobacco Use: Low Risk (03/01/2024)   Patient History    Smoking Tobacco Use: Never    Smokeless Tobacco Use: Never    Passive Exposure: Never  Financial Resource Strain: Not on file  Food Insecurity: No Food Insecurity (01/06/2024)   Epic    Worried About Programme Researcher, Broadcasting/film/video in the Last Year: Never true    Ran Out of Food in the Last Year: Never true  Transportation Needs: No Transportation Needs (01/06/2024)   Epic    Lack of Transportation (Medical): No    Lack of Transportation (Non-Medical): No  Physical Activity: Not on file  Stress: Not on file  Social Connections: Moderately Integrated (01/06/2024)   Social Connection and Isolation Panel    Frequency of Communication with Friends and Family: Once a week    Frequency of Social Gatherings with Friends and Family: Once a week    Attends Religious Services: 1 to 4 times per year    Active Member  of [employer] or Organizations: Yes    Attends Banker Meetings: 1 to 4 times per year    Marital Status: Married  Depression (EYV7-0): Not on file  Alcohol  Screen: Not on file  Housing: Low Risk (01/06/2024)   Epic    Unable to Pay for Housing in the Last Year: No    Number of Times Moved in the Last Year: 0    Homeless in the Last Year: No  Utilities: Not At Risk (01/06/2024)   Epic    Threatened with loss of utilities: No  Health Literacy: Not on file     Family History: The patient's family history is not on file.  ROS:   Review of Systems  Constitution: Negative for decreased appetite, fever  and weight gain.  HENT: Negative for congestion, ear discharge, hoarse voice and sore throat.   Eyes: Negative for discharge, redness, vision loss in right eye and visual halos.  Cardiovascular: Negative for chest pain, dyspnea on exertion, leg swelling, orthopnea and palpitations.  Respiratory: Negative for cough, hemoptysis, shortness of breath and snoring.   Endocrine: Negative for heat intolerance and polyphagia.  Hematologic/Lymphatic: Negative for bleeding problem. Does not bruise/bleed easily.  Skin: Negative for flushing, nail changes, rash and suspicious lesions.  Musculoskeletal: Negative for arthritis, joint pain, muscle cramps, myalgias, neck pain and stiffness.  Gastrointestinal: Negative for abdominal pain, bowel incontinence, diarrhea and excessive appetite.  Genitourinary: Negative for decreased libido, genital sores and incomplete emptying.  Neurological: Negative for brief paralysis, focal weakness, headaches and loss of balance.  Psychiatric/Behavioral: Negative for altered mental status, depression and suicidal ideas.  Allergic/Immunologic: Negative for HIV exposure and persistent infections.    EKGs/Labs/Other Studies Reviewed:    The following studies were reviewed today:   EKG:  The ekg ordered today demonstrates   Recent Labs: 07/29/2023: Magnesium  2.6 01/05/2024: ALT 34 01/10/2024: BUN 16; Creatinine, Ser 1.03; Hemoglobin 10.0; Platelets 137; Potassium 4.2; Sodium 131 01/25/2024: TSH 43.700  Recent Lipid Panel    Component Value Date/Time   CHOL 157 01/07/2024 0342   TRIG 48 01/07/2024 0342   HDL 82 01/07/2024 0342   CHOLHDL 1.9 01/07/2024 0342   VLDL 10 01/07/2024 0342   LDLCALC 65 01/07/2024 0342    Physical Exam:    VS:  BP 134/74 (BP Location: Left Arm, Patient Position: Sitting, Cuff Size: Normal)   Pulse (!) 51   Ht 5' 2 (1.575 m)   Wt 126 lb 14.4 oz (57.6 kg)   SpO2 98%   BMI 23.21 kg/m     Wt Readings from Last 3 Encounters:  03/01/24 126 lb 14.4 oz (57.6 kg)  01/25/24 125 lb (56.7 kg)  01/06/24 124 lb 5.4 oz (56.4 kg)     GEN: Well nourished, well developed in no acute distress HEENT: Normal NECK: No JVD; No carotid bruits LYMPHATICS: No lymphadenopathy CARDIAC: S1S2 noted,RRR, no murmurs, rubs, gallops RESPIRATORY:  Clear to auscultation without rales, wheezing or rhonchi  ABDOMEN: Soft, non-tender, non-distended, +bowel sounds, no guarding. EXTREMITIES: No edema, No cyanosis, no clubbing MUSCULOSKELETAL:  No deformity  SKIN: Warm and dry NEUROLOGIC:  Alert and oriented x 3, non-focal PSYCHIATRIC:  Normal affect, good insight  ASSESSMENT:    1. Chronic heart failure with preserved ejection fraction (HCC)   2. Mixed hyperlipidemia   3. History of CVA (cerebrovascular accident)   4. History of intracranial hemorrhage   5. PAF (paroxysmal atrial fibrillation) (HCC)     PLAN:    Paroxysmal Atrial Fibrillation - Eliquis  was held due to the stroke but has since been restarted. Continue current dose of amiodarone .   ICH/recent CVA - continue current medication  Hypertension Blood pressure slightly elevated at 140/80 mmHg, stable on metoprolol . - Continue metoprolol  as prescribed.  Chronic diastolic heart failure-he appears to be clinically euvolemic.  Is on daily Lasix .  Will continue Lasix   for now.  Will get blood work today.   Hyperlipidemia Stable on atorvastatin , no medication changes needed. - Continue atorvastatin  as prescribed.   The patient is in agreement with the above plan. The patient left the office in stable condition.  The patient will follow up in   Medication Adjustments/Labs and Tests Ordered: Current medicines are reviewed at length with the patient today.  Concerns regarding medicines are outlined above.  No orders of the defined types were placed in this encounter.  No orders of the defined types were placed in this encounter.   Patient Instructions  Medication Instructions:  Your physician recommends that you continue on your current medications as directed. Please refer to the Current Medication list given to you today.  *If you need a refill on your cardiac medications before your next appointment, please call your pharmacy*   Follow-Up: At [facility], you and your health needs are our priority.  As part of our continuing mission to provide you with exceptional heart care, our providers are all part of one team.  This team includes your primary Cardiologist (physician) and Advanced Practice Providers or APPs (Physician Assistants and Nurse Practitioners) who all work together to provide you with the care you need, when you need it.  Your next appointment:   6 month(s) - call in March for appointment   Provider:   Eletha Culbertson, DO      Adopting a Healthy Lifestyle.  Know what a healthy weight is for you (roughly BMI <25) and aim to maintain this   Aim for 7+ servings of fruits and vegetables daily   65-80+ fluid ounces of water or unsweet tea for healthy kidneys   Limit to max 1 drink of alcohol  per day; avoid smoking/tobacco   Limit animal fats in diet for cholesterol and heart health - choose grass fed whenever available   Avoid highly processed foods, and foods high in saturated/trans fats   Aim for low stress - take  time to unwind and care for your mental health   Aim for 150 min of moderate intensity exercise weekly for heart health, and weights twice weekly for bone health   Aim for 7-9 hours of sleep daily   When it comes to diets, agreement about the perfect plan isnt easy to find, even among the experts. Experts at the [facility] [employer] developed an idea known as the Healthy Eating Plate. Just imagine a plate divided into logical, healthy portions.   The emphasis is on diet quality:   Load up on vegetables and fruits - one-half of your plate: Aim for color and variety, and remember that potatoes dont count.   Go for whole grains - one-quarter of your plate: Whole wheat, barley, wheat berries, quinoa, oats, brown rice, and foods made with them. If you want pasta, go with whole wheat pasta.   Protein power - one-quarter of your plate: Fish, chicken, beans, and nuts are all healthy, versatile protein sources. Limit red meat.   The diet, however, does go beyond the plate, offering a few other suggestions.   Use healthy plant oils, such as olive, canola, soy, corn, sunflower and peanut. Check the labels, and avoid partially hydrogenated oil, which have unhealthy trans fats.   If youre thirsty, drink water. Coffee and tea are good in moderation, but skip sugary drinks and limit milk and dairy products to one or two daily servings.   The type of carbohydrate in the diet is more important than the amount. Some sources of carbohydrates, such as vegetables, fruits, whole grains, and beans-are healthier than others.   Finally, stay active  Signed, Cathlin Buchan, DO  03/02/2024 9:30 PM    [facility] "

## 2024-03-27 ENCOUNTER — Ambulatory Visit: Admitting: Cardiology

## 2024-05-22 ENCOUNTER — Ambulatory Visit (HOSPITAL_COMMUNITY): Admitting: Internal Medicine

## 2024-07-24 ENCOUNTER — Ambulatory Visit (HOSPITAL_COMMUNITY): Admitting: Internal Medicine
